# Patient Record
Sex: Female | Born: 1955 | Race: Black or African American | Hispanic: No | Smoking: Former smoker
Health system: Southern US, Community
[De-identification: ages and names within clinical notes are randomized; demographics above are authoritative.]

## PROBLEM LIST (undated history)

## (undated) DIAGNOSIS — J449 Chronic obstructive pulmonary disease, unspecified: Secondary | ICD-10-CM

## (undated) DIAGNOSIS — M170 Bilateral primary osteoarthritis of knee: Secondary | ICD-10-CM

## (undated) DIAGNOSIS — K219 Gastro-esophageal reflux disease without esophagitis: Secondary | ICD-10-CM

## (undated) DIAGNOSIS — E669 Obesity, unspecified: Secondary | ICD-10-CM

## (undated) DIAGNOSIS — M109 Gout, unspecified: Secondary | ICD-10-CM

## (undated) DIAGNOSIS — J309 Allergic rhinitis, unspecified: Secondary | ICD-10-CM

## (undated) DIAGNOSIS — G4730 Sleep apnea, unspecified: Secondary | ICD-10-CM

## (undated) DIAGNOSIS — I5030 Unspecified diastolic (congestive) heart failure: Secondary | ICD-10-CM

## (undated) DIAGNOSIS — J9610 Chronic respiratory failure, unspecified whether with hypoxia or hypercapnia: Secondary | ICD-10-CM

## (undated) DIAGNOSIS — I4891 Unspecified atrial fibrillation: Secondary | ICD-10-CM

## (undated) DIAGNOSIS — I10 Essential (primary) hypertension: Secondary | ICD-10-CM

## (undated) HISTORY — PX: TONSILLECTOMY: SUR1361

---

## 2008-05-17 ENCOUNTER — Ambulatory Visit: Payer: Self-pay | Admitting: Family Medicine

## 2008-06-12 ENCOUNTER — Inpatient Hospital Stay: Payer: Self-pay | Admitting: Internal Medicine

## 2008-06-27 ENCOUNTER — Ambulatory Visit: Payer: Self-pay | Admitting: Family Medicine

## 2008-06-29 ENCOUNTER — Ambulatory Visit: Payer: Self-pay

## 2008-07-17 ENCOUNTER — Ambulatory Visit: Payer: Self-pay | Admitting: Family Medicine

## 2008-09-08 ENCOUNTER — Encounter: Payer: Self-pay | Admitting: Specialist

## 2008-09-09 ENCOUNTER — Encounter: Payer: Self-pay | Admitting: Specialist

## 2008-10-09 ENCOUNTER — Encounter: Payer: Self-pay | Admitting: Specialist

## 2008-11-09 ENCOUNTER — Encounter: Payer: Self-pay | Admitting: Specialist

## 2008-12-09 ENCOUNTER — Encounter: Payer: Self-pay | Admitting: Specialist

## 2009-02-11 ENCOUNTER — Encounter: Payer: Self-pay | Admitting: Internal Medicine

## 2009-04-28 ENCOUNTER — Encounter: Payer: Self-pay | Admitting: Specialist

## 2009-05-11 ENCOUNTER — Encounter: Payer: Self-pay | Admitting: Specialist

## 2009-06-11 ENCOUNTER — Encounter: Payer: Self-pay | Admitting: Specialist

## 2009-07-04 ENCOUNTER — Ambulatory Visit: Payer: Self-pay | Admitting: Internal Medicine

## 2009-07-12 ENCOUNTER — Encounter: Payer: Self-pay | Admitting: Specialist

## 2009-08-09 ENCOUNTER — Encounter: Payer: Self-pay | Admitting: Specialist

## 2010-05-05 ENCOUNTER — Encounter: Payer: Self-pay | Admitting: Specialist

## 2010-05-11 ENCOUNTER — Encounter: Payer: Self-pay | Admitting: Specialist

## 2010-06-11 ENCOUNTER — Encounter: Payer: Self-pay | Admitting: Specialist

## 2010-07-03 ENCOUNTER — Other Ambulatory Visit: Payer: Self-pay | Admitting: Internal Medicine

## 2010-07-12 ENCOUNTER — Encounter: Payer: Self-pay | Admitting: Specialist

## 2010-08-10 ENCOUNTER — Encounter: Payer: Self-pay | Admitting: Specialist

## 2010-09-04 ENCOUNTER — Ambulatory Visit: Payer: Self-pay | Admitting: Internal Medicine

## 2010-09-10 ENCOUNTER — Encounter: Payer: Self-pay | Admitting: Specialist

## 2010-10-10 ENCOUNTER — Encounter: Payer: Self-pay | Admitting: Specialist

## 2010-11-10 ENCOUNTER — Encounter: Payer: Self-pay | Admitting: Specialist

## 2010-12-10 ENCOUNTER — Encounter: Payer: Self-pay | Admitting: Specialist

## 2011-02-08 ENCOUNTER — Ambulatory Visit: Payer: Self-pay | Admitting: Internal Medicine

## 2012-12-09 ENCOUNTER — Ambulatory Visit: Payer: Self-pay | Admitting: Internal Medicine

## 2014-05-07 ENCOUNTER — Inpatient Hospital Stay: Payer: Self-pay | Admitting: Internal Medicine

## 2014-05-07 LAB — COMPREHENSIVE METABOLIC PANEL
ANION GAP: 7 (ref 7–16)
AST: 46 U/L — AB (ref 15–37)
Albumin: 2.1 g/dL — ABNORMAL LOW (ref 3.4–5.0)
Alkaline Phosphatase: 117 U/L — ABNORMAL HIGH
BILIRUBIN TOTAL: 1.5 mg/dL — AB (ref 0.2–1.0)
BUN: 15 mg/dL (ref 7–18)
Calcium, Total: 8.7 mg/dL (ref 8.5–10.1)
Chloride: 100 mmol/L (ref 98–107)
Co2: 27 mmol/L (ref 21–32)
Creatinine: 1.03 mg/dL (ref 0.60–1.30)
GFR CALC NON AF AMER: 59 — AB
Glucose: 114 mg/dL — ABNORMAL HIGH (ref 65–99)
OSMOLALITY: 270 (ref 275–301)
Potassium: 3.4 mmol/L — ABNORMAL LOW (ref 3.5–5.1)
SGPT (ALT): 64 U/L — ABNORMAL HIGH
Sodium: 134 mmol/L — ABNORMAL LOW (ref 136–145)
Total Protein: 7.3 g/dL (ref 6.4–8.2)

## 2014-05-07 LAB — CBC WITH DIFFERENTIAL/PLATELET
BASOS PCT: 0.2 %
Basophil #: 0 10*3/uL (ref 0.0–0.1)
EOS ABS: 0.1 10*3/uL (ref 0.0–0.7)
Eosinophil %: 0.3 %
HCT: 33.5 % — AB (ref 35.0–47.0)
HGB: 10.4 g/dL — ABNORMAL LOW (ref 12.0–16.0)
LYMPHS ABS: 1.2 10*3/uL (ref 1.0–3.6)
LYMPHS PCT: 7.2 %
MCH: 26.7 pg (ref 26.0–34.0)
MCHC: 31 g/dL — ABNORMAL LOW (ref 32.0–36.0)
MCV: 86 fL (ref 80–100)
Monocyte #: 0.8 x10 3/mm (ref 0.2–0.9)
Monocyte %: 5 %
Neutrophil #: 14.2 10*3/uL — ABNORMAL HIGH (ref 1.4–6.5)
Neutrophil %: 87.3 %
PLATELETS: 253 10*3/uL (ref 150–440)
RBC: 3.89 10*6/uL (ref 3.80–5.20)
RDW: 14.9 % — AB (ref 11.5–14.5)
WBC: 16.3 10*3/uL — AB (ref 3.6–11.0)

## 2014-05-07 LAB — URINALYSIS, COMPLETE
Bilirubin,UR: NEGATIVE
Blood: NEGATIVE
GLUCOSE, UR: NEGATIVE mg/dL (ref 0–75)
KETONE: NEGATIVE
Leukocyte Esterase: NEGATIVE
Nitrite: NEGATIVE
Ph: 6 (ref 4.5–8.0)
RBC,UR: 2 /HPF (ref 0–5)
Specific Gravity: 1.017 (ref 1.003–1.030)
WBC UR: 31 /HPF (ref 0–5)

## 2014-05-07 LAB — PROTIME-INR
INR: 2.4
PROTHROMBIN TIME: 25.4 s — AB (ref 11.5–14.7)

## 2014-05-08 LAB — CBC WITH DIFFERENTIAL/PLATELET
Basophil #: 0 10*3/uL (ref 0.0–0.1)
Basophil %: 0.2 %
EOS PCT: 0.6 %
Eosinophil #: 0.1 10*3/uL (ref 0.0–0.7)
HCT: 32.4 % — ABNORMAL LOW (ref 35.0–47.0)
HGB: 10.2 g/dL — AB (ref 12.0–16.0)
LYMPHS ABS: 0.8 10*3/uL — AB (ref 1.0–3.6)
LYMPHS PCT: 5.4 %
MCH: 27.3 pg (ref 26.0–34.0)
MCHC: 31.4 g/dL — ABNORMAL LOW (ref 32.0–36.0)
MCV: 87 fL (ref 80–100)
MONO ABS: 0.9 x10 3/mm (ref 0.2–0.9)
Monocyte %: 6.2 %
NEUTROS ABS: 12.6 10*3/uL — AB (ref 1.4–6.5)
Neutrophil %: 87.6 %
Platelet: 225 10*3/uL (ref 150–440)
RBC: 3.74 10*6/uL — ABNORMAL LOW (ref 3.80–5.20)
RDW: 14.9 % — AB (ref 11.5–14.5)
WBC: 14.3 10*3/uL — ABNORMAL HIGH (ref 3.6–11.0)

## 2014-05-08 LAB — BASIC METABOLIC PANEL
ANION GAP: 8 (ref 7–16)
BUN: 13 mg/dL (ref 7–18)
CALCIUM: 8.6 mg/dL (ref 8.5–10.1)
CREATININE: 0.98 mg/dL (ref 0.60–1.30)
Chloride: 101 mmol/L (ref 98–107)
Co2: 27 mmol/L (ref 21–32)
EGFR (Non-African Amer.): >60
Glucose: 95 mg/dL (ref 65–99)
OSMOLALITY: 272 (ref 275–301)
Potassium: 3.2 mmol/L — ABNORMAL LOW (ref 3.5–5.1)
Sodium: 136 mmol/L (ref 136–145)

## 2014-05-08 LAB — PROTIME-INR
INR: 2.4
Prothrombin Time: 25.8 secs — ABNORMAL HIGH (ref 11.5–14.7)

## 2014-05-09 LAB — CBC WITH DIFFERENTIAL/PLATELET
BASOS PCT: 0.3 %
Basophil #: 0 10*3/uL (ref 0.0–0.1)
Eosinophil #: 0.1 10*3/uL (ref 0.0–0.7)
Eosinophil %: 0.7 %
HCT: 30.7 % — ABNORMAL LOW (ref 35.0–47.0)
HGB: 9.5 g/dL — AB (ref 12.0–16.0)
Lymphocyte #: 1.3 10*3/uL (ref 1.0–3.6)
Lymphocyte %: 9 %
MCH: 26.9 pg (ref 26.0–34.0)
MCHC: 30.9 g/dL — AB (ref 32.0–36.0)
MCV: 87 fL (ref 80–100)
MONO ABS: 1.6 x10 3/mm — AB (ref 0.2–0.9)
Monocyte %: 11 %
NEUTROS ABS: 11.7 10*3/uL — AB (ref 1.4–6.5)
Neutrophil %: 79 %
Platelet: 238 10*3/uL (ref 150–440)
RBC: 3.53 10*6/uL — ABNORMAL LOW (ref 3.80–5.20)
RDW: 14.7 % — ABNORMAL HIGH (ref 11.5–14.5)
WBC: 14.8 10*3/uL — ABNORMAL HIGH (ref 3.6–11.0)

## 2014-05-09 LAB — BASIC METABOLIC PANEL
Anion Gap: 7 (ref 7–16)
BUN: 15 mg/dL (ref 7–18)
Calcium, Total: 8.7 mg/dL (ref 8.5–10.1)
Chloride: 100 mmol/L (ref 98–107)
Co2: 27 mmol/L (ref 21–32)
Creatinine: 1.01 mg/dL (ref 0.60–1.30)
EGFR (African American): >60
EGFR (Non-African Amer.): >60
Glucose: 119 mg/dL — ABNORMAL HIGH (ref 65–99)
Osmolality: 270 (ref 275–301)
Potassium: 3.6 mmol/L (ref 3.5–5.1)
Sodium: 134 mmol/L — ABNORMAL LOW (ref 136–145)

## 2014-05-09 LAB — URINE CULTURE

## 2014-05-09 LAB — PROTIME-INR
INR: 2.4
Prothrombin Time: 25.9 secs — ABNORMAL HIGH (ref 11.5–14.7)

## 2014-05-10 LAB — CBC WITH DIFFERENTIAL/PLATELET
Basophil #: 0 10*3/uL (ref 0.0–0.1)
Basophil %: 0.2 %
EOS PCT: 0.7 %
Eosinophil #: 0.1 10*3/uL (ref 0.0–0.7)
HCT: 31.6 % — ABNORMAL LOW (ref 35.0–47.0)
HGB: 9.7 g/dL — AB (ref 12.0–16.0)
Lymphocyte #: 1.2 10*3/uL (ref 1.0–3.6)
Lymphocyte %: 8.3 %
MCH: 26.6 pg (ref 26.0–34.0)
MCHC: 30.6 g/dL — AB (ref 32.0–36.0)
MCV: 87 fL (ref 80–100)
Monocyte #: 1.4 x10 3/mm — ABNORMAL HIGH (ref 0.2–0.9)
Monocyte %: 9.3 %
NEUTROS ABS: 11.8 10*3/uL — AB (ref 1.4–6.5)
NEUTROS PCT: 81.5 %
PLATELETS: 259 10*3/uL (ref 150–440)
RBC: 3.64 10*6/uL — AB (ref 3.80–5.20)
RDW: 14.9 % — ABNORMAL HIGH (ref 11.5–14.5)
WBC: 14.5 10*3/uL — AB (ref 3.6–11.0)

## 2014-05-10 LAB — BASIC METABOLIC PANEL
Anion Gap: 9 (ref 7–16)
BUN: 17 mg/dL (ref 7–18)
CO2: 27 mmol/L (ref 21–32)
Calcium, Total: 8.9 mg/dL (ref 8.5–10.1)
Chloride: 99 mmol/L (ref 98–107)
Creatinine: 1.06 mg/dL (ref 0.60–1.30)
EGFR (African American): >60
EGFR (Non-African Amer.): 57 — ABNORMAL LOW
GLUCOSE: 124 mg/dL — AB (ref 65–99)
Osmolality: 273 (ref 275–301)
Potassium: 3.8 mmol/L (ref 3.5–5.1)
Sodium: 135 mmol/L — ABNORMAL LOW (ref 136–145)

## 2014-05-10 LAB — HEPATIC FUNCTION PANEL A (ARMC)
ALBUMIN: 1.8 g/dL — AB (ref 3.4–5.0)
ALT: 53 U/L
Alkaline Phosphatase: 109 U/L
Bilirubin, Direct: 0.5 mg/dL — ABNORMAL HIGH (ref 0.0–0.2)
Bilirubin,Total: 0.9 mg/dL (ref 0.2–1.0)
SGOT(AST): 46 U/L — ABNORMAL HIGH (ref 15–37)
Total Protein: 7.3 g/dL (ref 6.4–8.2)

## 2014-05-10 LAB — PROTIME-INR
INR: 3.2
PROTHROMBIN TIME: 32 s — AB (ref 11.5–14.7)

## 2014-05-11 LAB — CBC WITH DIFFERENTIAL/PLATELET
Basophil #: 0 10*3/uL (ref 0.0–0.1)
Basophil %: 0.3 %
Eosinophil #: 0.2 10*3/uL (ref 0.0–0.7)
Eosinophil %: 1.2 %
HCT: 29.2 % — ABNORMAL LOW (ref 35.0–47.0)
HGB: 9 g/dL — ABNORMAL LOW (ref 12.0–16.0)
LYMPHS PCT: 8.4 %
Lymphocyte #: 1.1 10*3/uL (ref 1.0–3.6)
MCH: 26.7 pg (ref 26.0–34.0)
MCHC: 30.8 g/dL — ABNORMAL LOW (ref 32.0–36.0)
MCV: 87 fL (ref 80–100)
Monocyte #: 1.1 x10 3/mm — ABNORMAL HIGH (ref 0.2–0.9)
Monocyte %: 8.6 %
NEUTROS PCT: 81.5 %
Neutrophil #: 10.8 10*3/uL — ABNORMAL HIGH (ref 1.4–6.5)
Platelet: 260 10*3/uL (ref 150–440)
RBC: 3.37 10*6/uL — AB (ref 3.80–5.20)
RDW: 15.1 % — ABNORMAL HIGH (ref 11.5–14.5)
WBC: 13.2 10*3/uL — AB (ref 3.6–11.0)

## 2014-05-11 LAB — COMPREHENSIVE METABOLIC PANEL
Albumin: 1.7 g/dL — ABNORMAL LOW (ref 3.4–5.0)
Alkaline Phosphatase: 107 U/L
Anion Gap: 8 (ref 7–16)
BUN: 19 mg/dL — AB (ref 7–18)
Bilirubin,Total: 0.9 mg/dL (ref 0.2–1.0)
CALCIUM: 8.5 mg/dL (ref 8.5–10.1)
CREATININE: 0.96 mg/dL (ref 0.60–1.30)
Chloride: 100 mmol/L (ref 98–107)
Co2: 28 mmol/L (ref 21–32)
EGFR (African American): >60
Glucose: 116 mg/dL — ABNORMAL HIGH (ref 65–99)
Osmolality: 275 (ref 275–301)
POTASSIUM: 3.9 mmol/L (ref 3.5–5.1)
SGOT(AST): 44 U/L — ABNORMAL HIGH (ref 15–37)
SGPT (ALT): 48 U/L
SODIUM: 136 mmol/L (ref 136–145)
Total Protein: 7 g/dL (ref 6.4–8.2)

## 2014-05-11 LAB — PROTIME-INR
INR: 3.1
Prothrombin Time: 31.3 secs — ABNORMAL HIGH (ref 11.5–14.7)

## 2014-05-12 LAB — CBC WITH DIFFERENTIAL/PLATELET
BASOS ABS: 0.1 10*3/uL (ref 0.0–0.1)
Basophil %: 0.5 %
EOS ABS: 0.1 10*3/uL (ref 0.0–0.7)
Eosinophil %: 1.2 %
HCT: 28.3 % — ABNORMAL LOW (ref 35.0–47.0)
HGB: 8.7 g/dL — AB (ref 12.0–16.0)
LYMPHS ABS: 1 10*3/uL (ref 1.0–3.6)
LYMPHS PCT: 9.3 %
MCH: 26.7 pg (ref 26.0–34.0)
MCHC: 30.8 g/dL — AB (ref 32.0–36.0)
MCV: 87 fL (ref 80–100)
MONO ABS: 0.9 x10 3/mm (ref 0.2–0.9)
Monocyte %: 7.9 %
NEUTROS ABS: 9 10*3/uL — AB (ref 1.4–6.5)
NEUTROS PCT: 81.1 %
PLATELETS: 267 10*3/uL (ref 150–440)
RBC: 3.26 10*6/uL — ABNORMAL LOW (ref 3.80–5.20)
RDW: 15.6 % — ABNORMAL HIGH (ref 11.5–14.5)
WBC: 11.2 10*3/uL — AB (ref 3.6–11.0)

## 2014-05-12 LAB — COMPREHENSIVE METABOLIC PANEL
ALBUMIN: 1.8 g/dL — AB (ref 3.4–5.0)
ALT: 50 U/L
AST: 48 U/L — AB (ref 15–37)
Alkaline Phosphatase: 111 U/L
Anion Gap: 7 (ref 7–16)
BUN: 19 mg/dL — AB (ref 7–18)
Bilirubin,Total: 0.7 mg/dL (ref 0.2–1.0)
CALCIUM: 9.2 mg/dL (ref 8.5–10.1)
CO2: 30 mmol/L (ref 21–32)
Chloride: 99 mmol/L (ref 98–107)
Creatinine: 0.86 mg/dL (ref 0.60–1.30)
EGFR (African American): >60
EGFR (Non-African Amer.): >60
Glucose: 109 mg/dL — ABNORMAL HIGH (ref 65–99)
Osmolality: 275 (ref 275–301)
Potassium: 4.1 mmol/L (ref 3.5–5.1)
Sodium: 136 mmol/L (ref 136–145)
TOTAL PROTEIN: 7.1 g/dL (ref 6.4–8.2)

## 2014-05-12 LAB — PROTIME-INR
INR: 4.3
Prothrombin Time: 40 secs — ABNORMAL HIGH (ref 11.5–14.7)

## 2014-05-12 LAB — CULTURE, BLOOD (SINGLE)

## 2014-07-06 ENCOUNTER — Encounter: Payer: Self-pay | Admitting: Specialist

## 2014-07-12 ENCOUNTER — Encounter: Payer: Self-pay | Admitting: Specialist

## 2014-08-10 ENCOUNTER — Encounter
Admit: 2014-08-10 | Disposition: A | Discharge status: Home / Self Care | Payer: Self-pay | Attending: Specialist | Admitting: Specialist

## 2014-09-10 ENCOUNTER — Encounter
Admit: 2014-09-10 | Disposition: A | Discharge status: Home / Self Care | Payer: Self-pay | Attending: Specialist | Admitting: Specialist

## 2014-10-02 NOTE — H&P (Signed)
PATIENT NAME:  Emily Fry, Emily Fry MR#:  [id number] DATE OF BIRTH:  August 11, 1955  DATE OF ADMISSION:  05/07/2014  PRIMARY CARE PHYSICIAN: Marya Amsler. Dareen Piano, MD  CHIEF COMPLAINT: Back pain and dysuria.   HISTORY OF PRESENT ILLNESS: The patient is a 59 year old African American female who has been having dysuria for the past 1 week associated with low back pain. Started from last Sunday. The patient is reporting that she was ambulating and quite active until last Sunday. On Sunday after she came back from church, while hanging her coat, she suddenly started having back pain. After that, because of the back pain and dysuria, she stopped ambulating and staying in the bed most of the time. Sister came from [place] and brought her into the ED. In the ED, patient's temperature is at 100.9. White count is elevated at 16.7. Tachycardic. Blood pressure is low normal. LFTs are also elevated. As the patient is having difficulty with ambulation, CAT scan and MRA of the lower back were done. The CAT scan and MRA of the back have revealed mild spinal stenosis and also mild bilateral hydronephrosis with questionable ureteral obstruction. The patient has a chronic history of atrial fibrillation and takes Coumadin. The patient is lethargic during my examination, and questions are answered by her sister who is at bedside. The patient is opening her eyes to verbal commands but falling asleep, as the patient was given anxiolytics to get MRI.   PAST MEDICAL HISTORY: Hyperlipidemia, hypertension, chronic atrial fibrillation on Coumadin, obstructive sleep apnea, morbid obesity.   PAST SURGICAL HISTORY: Tonsillectomy.   ALLERGIES: IVP DYE.   PSYCHOSOCIAL HISTORY: Lives at home, lives alone. No smoking, alcohol, or illicit drug usage.   FAMILY HISTORY: Mother died at age 47 from buccal cancer. Father deceased at age 35 of lung cancer. Sister is healthy.   HOME MEDICATIONS: Percocet 5/325 1 to 2 tablets p.o. every 4 hours as  needed for pain,  aspirin 81 mg once daily, Cartia 120 mg 1 capsule p.o. once daily,  cyclobenzaprine 10 mg p.o. 3 times a day, furosemide 40 mg p.o. once daily, metoprolol tartrate 25 mg 1/2 tablet 2 times a day, simvastatin 40 mg p.o. once daily, Voltaren topical gel 1 application topically 4 times a day.  REVIEW OF SYSTEMS: Unobtainable as the patient is lethargic.   PHYSICAL EXAMINATION:  VITAL SIGNS: Temperature 100.9, pulse 118, respirations 20 to 25, blood pressure 117/66, pulse oximetry 99% . GENERAL: Not in acute distress, morbidly obese.  HEENT: Normocephalic, atraumatic. Pupils are equally reacting to light and accommodation. No scleral icterus. No conjunctival injection. No sinus tenderness. No postnasal drip. Moist mucous membranes.  NECK: Supple. No JVD. No thyromegaly. Range of motion intact.  LUNGS: Clear to auscultation bilaterally.  CARDIAC: S1, S2 normal. Regular rate and rhythm. Tachycardic.  GASTROINTESTINAL: Soft, obese. Bowel sounds are positive in all 4 quadrants. Minor lower abdominal tenderness is present. No masses felt. Positive CVA tenderness.  NEUROLOGIC: Lethargic but arousable, but not answering questions. Reflexes are 2+. Motor and sensory could not be elicited, as the patient is lethargic.  EXTREMITIES: Edema is present, pitting edema. No cyanosis. No clubbing.  SKIN: Warm to touch. Normal turgor. No rashes. No lesions.  MUSCULOSKELETAL: No joint effusion, tenderness, erythema.  PSYCHIATRIC: Mood and affect could not be elicited, as the patient is lethargic.  LABORATORY DATA: Glucose 114, BUN 15, Sodium 134, potassium 3.4, chloride 100, CO2 of 27. GFR greater than 60. Anion gap, serum osmolality are normal. LFTs: Albumin 2.1,  bilirubin total 1.5, alkaline phosphatase 117,  ALT 64. WBC 16.3, hemoglobin 10.4, hematocrit 30.5, platelets are 253,000. Urinalysis: Glucose negative, ketones negative, blood negative. Protein 30 mg/dL. Nitrites and leukocyte esterase are  negative. Bacteria trace.  IMAGING: CAT scan of the lumbar spine without contrast: Mild central spinal stenosis is noted in L3-L4 secondary to mild broad-based posterior disk bulging and hypertrophy of ligamentum flavum posteriorly. Incidental note is made of mild bilateral hydronephrosis and proximal ureteral dilatation. Distal ureteral obstruction cannot be excluded. Moderate degenerative disk disease noted in L4-L5. Moderate central spinal canal stenosis.  MRI of the thoracic spine without contrast: Negative for  cord compression. No mass lesions. Bilateral degenerative changes. Mild spinal stenosis bilaterally at T10-T11 and mild spinal stenosis noted at T5-T6 and T11-T12.   ASSESSMENT AND PLAN: A 59 year old female, morbidly obese, coming into the Emergency Department with sudden onset of low back pain from last Sunday and 1-week history of dysuria. In the Emergency Department, the patient is febrile, tachycardic, with leukocytosis. MRI of the spine with mild spinal stenosis.  1.  Sepsis, probably from acute pyelonephritis. Pancultures were obtained. We will provide her intravenous fluids and intravenous levofloxacin.  2.  Acute low back pain, not ambulating from Sunday. Etiology is unclear. Can be from acute pyelonephritis with mild spinal stenosis. We will provide her intravenous antibiotics. Orthopedic consult is placed for further evaluation.  3.  Mild bilateral hydronephrosis with questionable distal ureteral obstruction. We will provide her intravenous fluids and Foley catheter will be placed for postrenal etiology of obstruction. Urology consult is placed.  4.  Hyperlipidemia. The patient's LFTs are elevated. Will hold off on her statin.  5.  Hypertension. Blood pressure is low normal. Will hold off on the antihypertensive medications at this time.  6.  Obstructive sleep apnea. Sister is not sure whether she uses continuous positive airway pressure at bedtime or not. Patient is lethargic at  this time. Will clarify in the morning.  7.  Morbid obesity. Lifestyle modifications are recommended. The patient might be benefited with bariatric surgery.  CODE STATUS: She is full code. Sister is the medical power-of-attorney.  Transfer care to Dr. Dareen Piano.  TOTAL TIME SPENT: 50 minutes.  Plan of care discussed with her sister in detail. She is in agreement with the plan.    ____________________________ Ramonita Lab, MD ag:ST D: 05/07/2014 21:35:58 ET T: 05/07/2014 22:23:59 ET JOB#: [id number]  cc: Ramonita Lab, MD, <Dictator> Ramonita Lab MD ELECTRONICALLY SIGNED 05/27/2014 16:56

## 2014-10-02 NOTE — Consult Note (Signed)
Chief Complaint:  Subjective/Chief Complaint Persistent low back pain; no fevers/chills. Foley catheter still in place at pts request.   VITAL SIGNS/ANCILLARY NOTES: **Vital Signs.:   29-Nov-15 07:48  Vital Signs Type Q 8hr  Temperature Temperature (F) 98.4  Celsius 36.8  Temperature Source oral  Pulse Pulse 100  Respirations Respirations 18  Systolic BP Systolic BP 782  Diastolic BP (mmHg) Diastolic BP (mmHg) 71  Mean BP 84  Pulse Ox % Pulse Ox % 92  Pulse Ox Activity Level  At rest  Oxygen Delivery Room Air/ 21 %  *Intake and Output.:   Shift 29-Nov-15 15:00  Grand Totals Intake:  240 Output:      Net:  240 24 Hr.:  240  Oral Intake      In:  240  Urine ml     Out:  0  Length of Stay Totals Intake:  2756 Output:  3100    Net:  -344   Brief Assessment:  GEN well developed, well nourished   Cardiac Regular  no murmur   Respiratory normal resp effort  clear BS   Gastrointestinal Normal   Gastrointestinal details normal Soft  Nontender   EXTR negative cyanosis/clubbing, negative edema   Additional Physical Exam no significant CVAT Catheter draining clear yellow urine   Lab Results:  Routine Chem:  29-Nov-15 05:21   Glucose, Serum  119  BUN 15  Creatinine (comp) 1.01  Sodium, Serum  134  Potassium, Serum 3.6  Chloride, Serum 100  CO2, Serum 27  Calcium (Total), Serum 8.7  Anion Gap 7  Osmolality (calc) 270  eGFR (African American) >60  eGFR (Non-African American) >60 (eGFR values <59m/min/1.73 m2 may be an indication of chronic kidney disease (CKD). Calculated eGFR, using the MRDR Study equation, is useful in  patients with stable renal function. The eGFR calculation will not be reliable in acutely ill patients when serum creatinine is changing rapidly. It is not useful in patients on dialysis. The eGFR calculation may not be applicable to patients at the low and high extremes of body sizes, pregnant women, and vegetarians.)  Routine Coag:   29-Nov-15 05:21   Prothrombin  25.9  INR 2.4 (INR reference interval applies to patients on anticoagulant therapy. A single INR therapeutic range for coumarins is not optimal for all indications; however, the suggested range for most indications is 2.0 - 3.0. Exceptions to the INR Reference Range may include: Prosthetic heart valves, acute myocardial infarction, prevention of myocardial infarction, and combinations of aspirin and anticoagulant. The need for a higher or lower target INR must be assessed individually. Reference: The Pharmacology and Management of the Vitamin K  antagonists: the seventh ACCP Conference on Antithrombotic and Thrombolytic Therapy. CUMPNT.6144Sept:126 (3suppl): [id number] A HCT value >55% may artifactually increase the PT.  In one study,  the increase was an average of 25%. Reference:  "Effect on Routine and Special Coagulation Testing Values of Citrate Anticoagulant Adjustment in Patients with High HCT Values." American Journal of Clinical Pathology 2006;126:400-405.)  Routine Hem:  29-Nov-15 05:21   WBC (CBC)  14.8  RBC (CBC)  3.53  Hemoglobin (CBC)  9.5  Hematocrit (CBC)  30.7  Platelet Count (CBC) 238  MCV 87  MCH 26.9  MCHC  30.9  RDW  14.7  Neutrophil % 79.0  Lymphocyte % 9.0  Monocyte % 11.0  Eosinophil % 0.7  Basophil % 0.3  Neutrophil #  11.7  Lymphocyte # 1.3  Monocyte #  1.6  Eosinophil # 0.1  Basophil # 0.0 (Result(s) reported on 09 May 2014 at [facility].)   Radiology Results: CT:    28-Nov-15 15:24, CT Abdomen Pelvis WO for Stone  CT Abdomen Pelvis WO for Stone   REASON FOR EXAM:    fever, hydronephrosis  COMMENTS:       PROCEDURE: CT  - CT ABDOMEN /PELVIS WO (STONE)  - May 08 2014  3:24PM     CLINICAL DATA:  Fever, low back pain, urinary tract infection, left  hydronephrosis    EXAM:  CT ABDOMEN AND PELVIS WITHOUT CONTRAST    TECHNIQUE:  Multidetector CT imaging of the abdomen and pelvis was performed  following the  standard protocol without IV contrast.  COMPARISON:  CT scan lumbar spine 05/07/2014    FINDINGS:  Lung bases shows small right pleural effusion. Bilateral basilar  posterior atelectasis.    Sagittal images of the spine shows degenerative changes  thoracolumbar spine. Unenhanced liver shows no biliary ductal  dilatation. Gallbladder is contracted. Multiple small layering  calcified gallstones are noted the largest in gallbladder neck  region measures 4.3 mm. No pericholecystic fluid. Unenhanced  pancreas, spleen and right adrenal gland is unremarkable. There is a  low-density nodule left adrenal gland measures about 1.5 cm. This  may represent a not adenoma.  No nephrolithiasis. No hydronephrosis or hydroureter. Bilateral  ureter appears normal caliber. No calcified ureteral calculi are  noted bilaterally. Bilateral distal ureter is unremarkable. There is  a Foley catheter in decompressed urinary bladder.    No aortic aneurysm.    No small bowel obstruction.    There is moderate distension of the right colon with gas and stool  measures up to 5.6 cm in diameter. Moderate gaseous distension of  transverse colon with gas measuring up to 6.2 cm in diameter. Mild  gaseous distension of descending colon up to 3.5 mm. The  rectosigmoid colon is normal caliber. Findings suggest colonic  ileus. No evidence of colonic obstruction.  The uterus is atrophic. No adnexal mass. No inguinal adenopathy.  There are streaky artifacts from patient's large body habitus.     IMPRESSION:  1. Layering calcified small gallstones are noted within gallbladder  the largest in gallbladder neck region measures 4.3 mm.  2. No nephrolithiasis. No hydronephrosis or hydroureter. No  calcified ureteral calculi.  3. There is moderate distension of the right colon with gas and  stool. Moderate distension of transverse colon with gas. Mild  gaseous distension of descending colon. Findings most likely due  to  colonic ileus. No distal colonic obstruction.  4. No small bowel obstruction.  5. Small right pleural effusion bilateral lung bases posteriorly  atelectasis.  6. Probable left adrenal adenoma measures 1.5 cm.      Electronically Signed    By: Lahoma Crocker M.D.    On: 05/08/2014 16:47         Verified By: Ephraim Hamburger, M.D.,   Assessment/Plan:  Invasive Device Daily Assessment of Necessity:  Does the patient currently have any of the following indwelling devices? foley   Indwelling Urinary Catheter no longer required, order entered to discontinue   Assessment/Plan:  Assessment 59 year old female with acute cystitis, possible pyelonephritis with back pain. Hydronephrosis not seen on follow-up CT.   Plan 1) D/C catheter and perform trial of void today 2) Continue to treat E.Coli UTI for total 7-10 days   Urology will sign off. Please don't hesitate to contact me with any further issues.   Electronic Signatures:  Prentiss Bells (MD)  (Signed [phone] 14:25)  Authored: Chief Complaint, VITAL SIGNS/ANCILLARY NOTES, Brief Assessment, Lab Results, Radiology Results, Assessment/Plan   Last Updated: 29-Nov-15 14:25 by Prentiss Bells (MD)

## 2014-10-02 NOTE — Consult Note (Signed)
Admit Diagnosis:   SEPSIS PROB SEC TO A PYELONEPHRITIS.: Onset Date: 08-May-2014, Status: Active, Description: SEPSIS PROB SEC TO A PYELONEPHRITIS.      Admit Reason:   Hydronephrosis of left kidney (N13.30): Status: Active, Coding System: ICD10, Coded Name: Unspecified hydronephrosis    Hyperlipidemia:    Arthritis:    HTN:   Home Medications: Medication Instructions Status  acetaZOLAMIDE 250 mg oral tablet 1 tab(s) orally 2 times a day  Active  aspirin 81 mg oral tablet 1  orally once a day  Active  Metoprolol Tartrate 25 mg oral tablet 0.5 tab(s) orally 2 times a day  Active  Voltaren Topical 1% topical gel 1  apply topically 4 times a day  Active  simvastatin 40 mg oral tablet 1  orally once a day  Active  Coumadin 6 mg oral tablet 1 tab(s) orally once a day Active  Coumadin 1 mg oral tablet 1.5 tab(s) orally once a day Active  acetaminophen-oxyCODONE 325 mg-5 mg oral tablet 1-2 tab(s) orally every 4 hours -for Pain Active  cyclobenzaprine 10 mg oral tablet 1 tab(s) orally 3 times a day Active  Cartia XT 120 mg/24 hours oral capsule, extended release 1 cap(s) orally once a day (at bedtime) Active  furosemide 40 mg oral tablet 1 tab(s) orally once a day Active   Lab Results:  Hepatic:  27-Nov-15 13:52   Bilirubin, Total  1.5  Alkaline Phosphatase  117 (46-116 NOTE: New Reference Range 12/29/13)  SGPT (ALT)  64 (14-63 NOTE: New Reference Range 12/29/13)  SGOT (AST)  46  Total Protein, Serum 7.3  Albumin, Serum  2.1  Routine Micro:  27-Nov-15 14:30   Micro Text Report URINE CULTURE   ORGANISM 1                >100,000 CFU/ML GRAM NEGATIVE ROD   COMMENT                   ID TO FOLLOW SENSITIVITIES TO FOLLOW   ANTIBIOTIC                       Culture Comment ID TO FOLLOW SENSITIVITIES TO FOLLOW  Result(s) reported on 08 May 2014 at 10:51AM.  Organism Name GRAM NEGATIVE ROD  Organism Quantity >100,000 CFU/ML  Specimen Source IN AND OUT CATH  Organism 1  >100,000 CFU/ML GRAM NEGATIVE ROD    23:09   Micro Text Report BLOOD CULTURE   COMMENT                   NO GROWTH IN 8-12 HOURS   ANTIBIOTIC                       Culture Comment NO GROWTH IN 8-12 HOURS  Result(s) reported on 08 May 2014 at 07:00AM.    23:17   Micro Text Report BLOOD CULTURE   COMMENT                   NO GROWTH IN 8-12 HOURS   ANTIBIOTIC                       Culture Comment NO GROWTH IN 8-12 HOURS  Result(s) reported on 08 May 2014 at 07:00AM.  Routine Chem:  27-Nov-15 13:52   Glucose, Serum  114  BUN 15  Creatinine (comp) 1.03  Sodium, Serum  134  Potassium, Serum  3.4  Chloride, Serum  100  CO2, Serum 27  Calcium (Total), Serum 8.7  Anion Gap 7  Osmolality (calc) 270  eGFR (African American) >60  eGFR (Non-African American)  59 (eGFR values <53m/min/1.73 m2 may be an indication of chronic kidney disease (CKD). Calculated eGFR, using the MRDR Study equation, is useful in  patients with stable renal function. The eGFR calculation will not be reliable in acutely ill patients when serum creatinine is changing rapidly. It is not useful in patients on dialysis. The eGFR calculation may not be applicable to patients at the low and high extremes of body sizes, pregnant women, and vegetarians.)  28-Nov-15 04:43   Glucose, Serum 95  BUN 13  Creatinine (comp) 0.98  Sodium, Serum 136  Potassium, Serum  3.2  Chloride, Serum 101  CO2, Serum 27  Calcium (Total), Serum 8.6  Anion Gap 8  Osmolality (calc) 272  eGFR (African American) >60  eGFR (Non-African American) >60 (eGFR values <675mmin/1.73 m2 may be an indication of chronic kidney disease (CKD). Calculated eGFR, using the MRDR Study equation, is useful in  patients with stable renal function. The eGFR calculation will not be reliable in acutely ill patients when serum creatinine is changing rapidly. It is not useful in patients on dialysis. The eGFR calculation may not be applicable to patients  at the low and high extremes of body sizes, pregnant women, and vegetarians.)  Routine UA:  27-Nov-15 14:30   Color (UA) Amber  Clarity (UA) Cloudy  Glucose (UA) Negative  Bilirubin (UA) Negative  Ketones (UA) Negative  Specific Gravity (UA) 1.017  Blood (UA) Negative  pH (UA) 6.0  Protein (UA) 30 mg/dL  Nitrite (UA) Negative  Leukocyte Esterase (UA) Negative (Result(s) reported on 07 May 2014 at 03:21PM.)  RBC (UA) 2 /HPF  WBC (UA) 31 /HPF  Bacteria (UA) TRACE  Epithelial Cells (UA) 7 /HPF (Result(s) reported on 07 May 2014 at 03:21PM.)  Routine Coag:  27-Nov-15 13:52   Prothrombin  25.4  INR 2.4 (INR reference interval applies to patients on anticoagulant therapy. A single INR therapeutic range for coumarins is not optimal for all indications; however, the suggested range for most indications is 2.0 - 3.0. Exceptions to the INR Reference Range may include: Prosthetic heart valves, acute myocardial infarction, prevention of myocardial infarction, and combinations of aspirin and anticoagulant. The need for a higher or lower target INR must be assessed individually. Reference: The Pharmacology and Management of the Vitamin K  antagonists: the seventh ACCP Conference on Antithrombotic and Thrombolytic Therapy. ChDGLOV.5643ept:126 (3suppl): 20N9146842A HCT value >55% may artifactually increase the PT.  In one study,  the increase was an average of 25%. Reference:  "Effect on Routine and Special Coagulation Testing Values of Citrate Anticoagulant Adjustment in Patients with High HCT Values." American Journal of Clinical Pathology 2006;126:400-405.)  28-Nov-15 04:43   Prothrombin  25.8  INR 2.4 (INR reference interval applies to patients on anticoagulant therapy. A single INR therapeutic range for coumarins is not optimal for all indications; however, the suggested range for most indications is 2.0 - 3.0. Exceptions to the INR Reference Range may include: Prosthetic  heart valves, acute myocardial infarction, prevention of myocardial infarction, and combinations of aspirin and anticoagulant. The need for a higher or lower target INR must be assessed individually. Reference: The Pharmacology and Management of the Vitamin K  antagonists: the seventh ACCP Conference on Antithrombotic and Thrombolytic Therapy. ChPIRJJ.8841ept:126 (3suppl): 20N9146842A HCT value >55% may artifactually increase the PT.  In one  study,  the increase was an average of 25%. Reference:  "Effect on Routine and Special Coagulation Testing Values of Citrate Anticoagulant Adjustment in Patients with High HCT Values." American Journal of Clinical Pathology 2006;126:400-405.)  Routine Hem:  27-Nov-15 13:52   WBC (CBC)  16.3  RBC (CBC) 3.89  Hemoglobin (CBC)  10.4  Hematocrit (CBC)  33.5  Platelet Count (CBC) 253  MCV 86  MCH 26.7  MCHC  31.0  RDW  14.9  Neutrophil % 87.3  Lymphocyte % 7.2  Monocyte % 5.0  Eosinophil % 0.3  Basophil % 0.2  Neutrophil #  14.2  Lymphocyte # 1.2  Monocyte # 0.8  Eosinophil # 0.1  Basophil # 0.0 (Result(s) reported on 07 May 2014 at 02:06PM.)  28-Nov-15 04:43   WBC (CBC)  14.3  RBC (CBC)  3.74  Hemoglobin (CBC)  10.2  Hematocrit (CBC)  32.4  Platelet Count (CBC) 225  MCV 87  MCH 27.3  MCHC  31.4  RDW  14.9  Neutrophil % 87.6  Lymphocyte % 5.4  Monocyte % 6.2  Eosinophil % 0.6  Basophil % 0.2  Neutrophil #  12.6  Lymphocyte #  0.8  Monocyte # 0.9  Eosinophil # 0.1  Basophil # 0.0 (Result(s) reported on 08 May 2014 at 06:14AM.)   Radiology Results:  Radiology Results: LabUnknown:    27-Nov-15 19:32, CT Lumbar Spine Without Contrast  PACS Image  CT:  CT Lumbar Spine Without Contrast  REASON FOR EXAM:    incontinence, fever, leg weakness b/l  COMMENTS:       PROCEDURE: CT  - CT LUMBAR SPINE WO  - May 07 2014  7:32PM     CLINICAL DATA:  Acute lower back pain.  Difficulty walking.    EXAM:  CT LUMBAR SPINE WITHOUT  CONTRAST    TECHNIQUE:  Multidetector CT imaging of the lumbar spine was performed without  intravenous contrast administration. Multiplanar CT image  reconstructions were also generated.  COMPARISON:  None.    FINDINGS:  No fracture or spondylolisthesis is noted.    T11-12: Mild degenerative disc disease is noted. No definite central  spinal canal stenosis is noted.    T12-L1:  No definite abnormality seen.    L1-2:  No significant abnormality seen.    L2-3: Mild broad-based posterior disc bulging is noted without  significant central spinal canal stenosis.  L3-4: Mild degenerative changes seen involving posterior facet  joints. Mild broad-based posterior disc bulging is noted, which in  combination with hypertrophy of ligamentum flavum posteriorly,  results in mild central spinal canal stenosis.    L4-5: Moderate degenerative disc disease is noted with anterior  osteophyte formation. Moderate degenerative change of posterior  facet joints is noted. This, in combination with mild broad-based  posterior disc bulging, results in mild to moderate central spinal  canal stenosis at this level.    L5-S1:  No definite abnormality seen.     IMPRESSION:  Mild central spinal canal stenosis is noted at L3-4 secondary to  mild broad-based posterior disc bulging and hypertrophy of  ligamentum flavum posteriorly.    Moderate degenerative disc disease is noted at L4-5. Moderate  degenerative change of the posterior facet joints is noted at this  level, which in combination with mild broad-based posterior disc  bulging, results in mild to moderate central spinal canal stenosis.    Incidental note is made of minimal to mild bilateral hydronephrosis  and proximal ureteral dilatation. Distal ureteral obstruction cannot  be  excluded.      Electronically Signed    By: Sabino Dick M.D.    On: 05/07/2014 20:20         Verified By: Marveen Reeks, M.D.,    IVP Dye:  Unknown  Nursing Flowsheets: **Vital Signs.:   28-Nov-15 08:07  Vital Signs Type Q 8hr  Temperature Temperature (F) 101.2  Celsius 38.4  Temperature Source oral  Pulse Pulse 123  Respirations Respirations 18  Systolic BP Systolic BP 032  Diastolic BP (mmHg) Diastolic BP (mmHg) 66  Mean BP 78  Pulse Ox % Pulse Ox % 90  Pulse Ox Activity Level  At rest  Oxygen Delivery 2L    09:26  Vital Signs Type Post Medication  Temperature Temperature (F) 100.6  Celsius 38.1  Temperature Source oral  Pulse Pulse 137  Pulse source if not from Vital Sign Device apical    09:57  Pulse Pulse 127  Pulse source if not from Vital Sign Device per Telemetry Clerk  Telemetry pattern Cardiac Rhythm Sinus tachycardia; pattern reported by Telemetry Clerk    12:03  Vital Signs Type Recheck  Temperature Temperature (F) 98.8  Celsius 37.1  Temperature Source oral  Pulse Pulse 109  Pulse source if not from Vital Sign Device per cardiac monitor  Telemetry pattern Cardiac Rhythm Sinus tachycardia  *Intake and Output.:   Shift 28-Nov-15 15:00  Grand Totals Intake:  1051 Output:  300    Net:  751 24 Hr.:  751  Oral Intake      In:  360  IV (Primary)      In:  691  IV (Secondary)      In:  0  Urine ml     Out:  300  Length of Stay Totals Intake:  1606 Output:  2150    Net:  -73    General Aspect 59 year old woman admitted for low back pain, fever and urinary tract infection. CT of the lumbar spine shows incidental left hydronephrosis.  Her back pain began 6 days ago suddenly when hanging her coat after church. It is mostly central but radiates to bilateral flanks. She is afebrile currently and tolerating POs. Her WBC on admission was 16.7 and urine shows possible infection. Last oral intake 1pm. No hematuria, no dysuria or significant lower urinary tract symptoms. No history of stones. No history of GU surgery or trauma.   Case History and Physical Exam:  Past Medical Health Hypertension, Chronic  Afib, hyperlipidemia, Obstructive sleep apnea, morbid obesity   Past Surgical History Tonsillectomy   Family History Lung cancer (f)   HEENT PERLA   Neck/Nodes Supple   Chest/Lungs Clear   Breasts Not examined   Cardiovascular No Murmurs or Gallops  Normal Sinus Rhythm   Abdomen Obese, benign, non-tender, no surgical scars   Genitalia Not examined   Rectal Not examined   Musculoskeletal mild bilateral CVAT, midline tenderness   Neurological Grossly WNL   Skin Warm  Dry    Impression 59 year old woman with fever and leukocytosis with likely urinary tract infection and L>R hydronephrosis on CT lumbar spine.  This may represent a kidney stone, cystitis or other lower or upper urinary tract obstruction.   Plan 1) CT Abd/Pelvis without contrast to determine etiology of left > right hydronephrosis 2) Broad spectrum antibiotics 3) NPO now in preparation for possible surgical intervention depending on CT findings  Thank you for involving me in the care of Emily Fry. Urology will continue to  follow.   Electronic Signatures for Addendum Section:  Prentiss Bells (MD) (Signed Addendum [phone] 15:49)  CT Abd/Pelvis reviewed. On my preliminary read, the hydronephrosis appears to have resolved. There are no stones in the kidneys or ureters. The bladder is well decompressed with a Foley catheter.  Her hydronephrosis may have been related to acute cystitis with bladder wall thickening, or urinary retention.  The latter may be related to her lumbar spine pathology.  Now that her UTI is resolving, would recommend that the Foley catheter be removed in the AM with a trial of void.  If she does not empty well, will pursue workup of neurogenic urinary retention   Electronic Signatures: Prentiss Bells (MD)  (Signed [phone] 15:00)  Authored: Health Issues, Significant Events - History, Home Medications, Labs, Radiology Results, Allergies, Vital Signs, General Aspect/Present Illness, History  and Physical Exam, Impression/Plan   Last Updated: 28-Nov-15 15:49 by Prentiss Bells (MD)

## 2014-10-02 NOTE — Discharge Summary (Signed)
PATIENT NAME:  Emily Fry, Emily Fry MR#:  [id number] DATE OF BIRTH:  05/22/1956  DATE OF ADMISSION:  05/07/2014 DATE OF DISCHARGE:  05/12/2014   DISCHARGE DIAGNOSES:  1.  Sepsis syndrome with urinary tract infection from Escherichia coli that was pansensitive.  2.  Severe low back pain causing weakness.  3.  Pyelonephritis associated with the above.  4.  Morbid obesity, making the above difficult.  5.  Obstructive sleep apnea.  6.  Gallstones and abnormal liver function tests with no clear cholecystitis.  7.  Weakness and inability to walk more than very short distances on her own.  8.  Atrial fibrillation, heart rate reasonably controlled at this point with an elevated INR.  9.  Elevated INR, getting a dose of oral vitamin K today; holding Coumadin for several days.   DISCHARGE MEDICATIONS:  1.  Acetazolamide 250 mg b.i.d.  2.  Metoprolol 25 mg one half b.i.d.  3.  Voltaren gel to her back q.i.d.  4.  Simvastatin 40 mg daily.  5.  Warfarin 6 mg daily, which should be restarted on 05/14/2014 with pro-times followed closely after that.  6.  Acetaminophen/oxycodone 5/325, 1 to 2 every 4 hours p.r.n. pain.  7.  Flexeril 10 mg t.i.d. p.r.n.   8.  Diltiazem XR 240 mg 1 daily.  9.  Lasix 40 mg daily.  10.  Cipro 500 mg b.i.d. for 5 days.   HISTORY AND PHYSICAL: Please see detailed history and physical done on admission.   HOSPITAL COURSE: The patient was admitted tachycardic with worsening back pain and abnormal urinalysis, felt to have pyelonephritis and possible sepsis syndrome. She improved with IV antibiotics and IV fluids, etc. She had a good response to this. She was still very weak and could not ambulate well; no more than 5 feet the first day of treatment. She slowly improved, though she was still needing a 2-person assist. It was felt she needed a skilled facility for further rehab; this is being arranged.   Her white blood cell count 16,300 on admission; it slowly decreased to 11,200 by  today's date. INR was 2.4 on admission; it increased to 3.1 yesterday. Her right warfarin was held, but the INR increased from there to 4.3. She will bet a low dose of vitamin K today, and again, this should be repeated going forward. Her ALT and AST were both slightly elevated on admission, along with a bilirubin of 1.5 and a slightly elevated alkaline phosphatase. This did improve throughout the course of the hospitalization, with a normal bilirubin today and alkaline phosphatase. ALT is down to the normal range, with a minimally elevated AST only at this point.   Her other workup included a CT of her abdomen and pelvis; it showed gallstones with no nephrolithiasis with acute findings, but likely adrenal adenoma was seen. She will be seen at the skilled facility by the medical director there and will follow up with me following her discharged.   TIME SPENT: It took approximately 35 minutes to do all discharge tasks today.    ____________________________ Marya Amsler. Dareen Piano, MD mwa:MT D: 05/12/2014 08:00:20 ET T: 05/12/2014 08:20:31 ET JOB#: [id number]  cc: Marya Amsler. Dareen Piano, MD, <Dictator> Lauro Regulus MD ELECTRONICALLY SIGNED 05/18/2014 7:51

## 2014-10-11 ENCOUNTER — Ambulatory Visit: Payer: Self-pay

## 2014-10-13 ENCOUNTER — Ambulatory Visit: Payer: Self-pay

## 2014-10-15 ENCOUNTER — Ambulatory Visit: Payer: Self-pay

## 2014-10-18 ENCOUNTER — Ambulatory Visit: Payer: Self-pay

## 2014-10-20 ENCOUNTER — Ambulatory Visit: Payer: Self-pay

## 2014-10-22 ENCOUNTER — Ambulatory Visit: Payer: Self-pay

## 2014-10-22 ENCOUNTER — Telehealth: Payer: Self-pay | Admitting: *Deleted

## 2014-10-25 ENCOUNTER — Telehealth: Payer: Self-pay

## 2014-10-25 ENCOUNTER — Ambulatory Visit: Payer: Self-pay

## 2014-10-25 NOTE — Telephone Encounter (Signed)
Left message on machine.

## 2014-10-27 ENCOUNTER — Ambulatory Visit: Payer: Self-pay

## 2014-10-29 ENCOUNTER — Ambulatory Visit: Payer: Self-pay

## 2014-11-01 ENCOUNTER — Ambulatory Visit: Payer: Self-pay

## 2014-11-01 ENCOUNTER — Encounter: Payer: Self-pay | Admitting: Respiratory Therapy

## 2014-11-01 DIAGNOSIS — I509 Heart failure, unspecified: Secondary | ICD-10-CM

## 2014-11-01 DIAGNOSIS — J449 Chronic obstructive pulmonary disease, unspecified: Secondary | ICD-10-CM

## 2014-11-01 NOTE — Progress Notes (Signed)
Pulmonary Individual Treatment Plan  Patient Details  Name: Emily Fry MRN: [id number] Date of Birth: [birth year] Referring Provider:  No ref. provider found  Initial Encounter Date:    Visit Diagnosis: No diagnosis found.  Patient's Home Medications on Admission: No current outpatient prescriptions on file.  Past Medical History: No past medical history on file.  Tobacco Use: History  Smoking status   Not on file  Smokeless tobacco   Not on file    Labs: Recent Review Flowsheet Data    There is no flowsheet data to display.       ADL UCSD:    Pulmonary Function Assessment:   Exercise Target Goals:    Exercise Program Goal: Individual exercise prescription set with THRR, safety & activity barriers. Participant demonstrates ability to understand and report RPE using BORG scale, to self-measure pulse accurately, and to acknowledge the importance of the exercise prescription.  Exercise Prescription Goal: Starting with aerobic activity 30 plus minutes a day, 3 days per week for initial exercise prescription. Provide home exercise prescription and guidelines that participant acknowledges understanding prior to discharge.  Activity Barriers & Risk Stratification:   6 Minute Walk:   Initial Exercise Prescription:   Exercise Prescription Changes:   Discharge Exercise Prescription:    Nutrition:  Target Goals: Understanding of nutrition guidelines, daily intake of sodium 1500mg , cholesterol 200mg , calories 30% from fat and 7% or less from saturated fats, daily to have 5 or more servings of fruits and vegetables.  Biometrics:    Nutrition Therapy Plan and Nutrition Goals:   Nutrition Discharge: Rate Your Plate Scores:   Psychosocial: Target Goals: Acknowledge presence or absence of depression, maximize coping skills, provide positive support system. Participant is able to verbalize types and ability to use techniques and skills needed for  reducing stress and depression.  Initial Review & Psychosocial Screening:   Quality of Life Scores:   PHQ-9: Recent Review Flowsheet Data    There is no flowsheet data to display.      Psychosocial Evaluation and Intervention:   Psychosocial Re-Evaluation:  Education: Education Goals: Education classes will be provided on a weekly basis, covering required topics. Participant will state understanding/return demonstration of topics presented.  Learning Barriers/Preferences:   Education Topics: Initial Evaluation Education: - Verbal, written and demonstration of respiratory meds, RPE/PD scales, oximetry and breathing techniques. Instruction on use of nebulizers and MDIs: cleaning and proper use, rinsing mouth with steroid doses and importance of monitoring MDI activations.   General Nutrition Guidelines/Fats and Fiber: -Group instruction provided by verbal, written material, models and posters to present the general guidelines for heart healthy nutrition. Gives an explanation and review of dietary fats and fiber.   Controlling Sodium/Reading Food Labels: -Group verbal and written material supporting the discussion of sodium use in heart healthy nutrition. Review and explanation with models, verbal and written materials for utilization of the food label.   Exercise Physiology & Risk Factors: - Group verbal and written instruction with models to review the exercise physiology of the cardiovascular system and associated critical values. Details cardiovascular disease risk factors and the goals associated with each risk factor.   Aerobic Exercise & Resistance Training: - Gives group verbal and written discussion on the health impact of inactivity. On the components of aerobic and resistive training programs and the benefits of this training and how to safely progress through these programs.   Flexibility, Balance, General Exercise Guidelines: - Provides group verbal and  written instruction on the benefits of  flexibility and balance training programs. Provides general exercise guidelines with specific guidelines to those with heart or lung disease. Demonstration and skill practice provided.   Stress Management: - Provides group verbal and written instruction about the health risks of elevated stress, cause of high stress, and healthy ways to reduce stress.   Depression: - Provides group verbal and written instruction on the correlation between heart/lung disease and depressed mood, treatment options, and the stigmas associated with seeking treatment.   Exercise & Equipment Safety: - Individual verbal instruction and demonstration of equipment use and safety with use of the equipment.   Infection Prevention: - Provides verbal and written material to individual with discussion of infection control including proper hand washing and proper equipment cleaning during exercise session.   Falls Prevention: - Provides verbal and written material to individual with discussion of falls prevention and safety.   Diabetes: - Individual verbal and written instruction to review signs/symptoms of diabetes, desired ranges of glucose level fasting, after meals and with exercise. Advice that pre and post exercise glucose checks will be done for 3 sessions at entry of program.   Chronic Lung Diseases: - Group verbal and written instruction to review new updates, new respiratory medications, new advancements in procedures and treatments. Provide informative websites and "800" numbers of self-education.   Lung Procedures: - Group verbal and written instruction to describe testing methods done to diagnose lung disease. Review the outcome of test results. Describe the treatment choices: Pulmonary Function Tests, ABGs and oximetry.   Energy Conservation: - Provide group verbal and written instruction for methods to conserve energy, plan and organize activities. Instruct on  pacing techniques, use of adaptive equipment and posture/positioning to relieve shortness of breath.   Triggers: - Group verbal and written instruction to review types of environmental controls: home humidity, furnaces, filters, dust mite/pet prevention, HEPA vacuums. To discuss weather changes, air quality and the benefits of nasal washing.   Exacerbations: - Group verbal and written instruction to provide: warning signs, infection symptoms, calling MD promptly, preventive modes, and value of vaccinations. Review: effective airway clearance, coughing and/or vibration techniques. Create an Sports administrator.   Oxygen: - Individual and group verbal and written instruction on oxygen therapy. Includes supplement oxygen, available portable oxygen systems, continuous and intermittent flow rates, oxygen safety, concentrators, and Medicare reimbursement for oxygen.   Respiratory Medications: - Group verbal and written instruction to review medications for lung disease. Drug class, frequency, complications, importance of spacers, rinsing mouth after steroid MDI's, and proper cleaning methods for nebulizers.   AED/CPR: - Group verbal and written instruction with the use of models to demonstrate the basic use of the AED with the basic ABC's of resuscitation.   Breathing Retraining: - Provides individuals verbal and written instruction on purpose, frequency, and proper technique of diaphragmatic breathing and pursed-lipped breathing. Applies individual practice skills.   Anatomy and Physiology of the Lungs: - Group verbal and written instruction with the use of models to provide basic lung anatomy and physiology related to function, structure and complications of lung disease.   Heart Failure: - Group verbal and written instruction on the basics of heart failure: signs/symptoms, treatments, explanation of ejection fraction, enlarged heart and cardiomyopathy.   Sleep Apnea: - Individual verbal and  written instruction to review Obstructive Sleep Apnea. Review of risk factors, methods for diagnosing and types of masks and machines for OSA.   Anxiety: - Provides group, verbal and written instruction on the correlation between heart/lung disease and anxiety,  treatment options, and management of anxiety.   Relaxation: - Provides group, verbal and written instruction about the benefits of relaxation for patients with heart/lung disease. Also provides patients with examples of relaxation techniques.   Knowledge Questionnaire Score:   Personal Goals and Risk Factors at Admission:   Personal Goals and Risk Factors Review:    Personal Goals Discharge:    Comments: 30 day Review; Called Ms Steele Berg 09/28/2014, she has been having problems with leg swelling; she does plan to return to [facility]; last attended 08/11/2014.

## 2014-11-03 ENCOUNTER — Ambulatory Visit: Payer: Self-pay

## 2014-11-05 ENCOUNTER — Ambulatory Visit: Payer: Self-pay

## 2014-11-09 NOTE — Telephone Encounter (Signed)
Maka called to say won't be here for visit

## 2014-11-10 ENCOUNTER — Ambulatory Visit: Payer: Self-pay

## 2014-11-11 ENCOUNTER — Other Ambulatory Visit: Payer: Self-pay | Admitting: Orthopedic Surgery

## 2014-11-11 ENCOUNTER — Ambulatory Visit
Admission: RE | Admit: 2014-11-11 | Discharge: 2014-11-11 | Disposition: A | Discharge status: Home / Self Care | Payer: Medicare Other | Source: Ambulatory Visit | Attending: Orthopedic Surgery | Admitting: Orthopedic Surgery

## 2014-11-11 DIAGNOSIS — M7989 Other specified soft tissue disorders: Secondary | ICD-10-CM | POA: Insufficient documentation

## 2014-11-11 DIAGNOSIS — M7122 Synovial cyst of popliteal space [Baker], left knee: Secondary | ICD-10-CM | POA: Diagnosis not present

## 2014-11-12 ENCOUNTER — Ambulatory Visit: Payer: Self-pay

## 2014-11-15 ENCOUNTER — Ambulatory Visit: Payer: Self-pay

## 2014-11-17 ENCOUNTER — Ambulatory Visit: Payer: Self-pay

## 2014-11-19 ENCOUNTER — Ambulatory Visit: Payer: Self-pay

## 2014-11-22 ENCOUNTER — Ambulatory Visit: Payer: Self-pay

## 2014-11-22 NOTE — Progress Notes (Signed)
Left message on machine.

## 2014-11-24 ENCOUNTER — Ambulatory Visit: Payer: Self-pay

## 2014-11-26 ENCOUNTER — Ambulatory Visit: Payer: Self-pay

## 2014-11-29 ENCOUNTER — Ambulatory Visit: Payer: Self-pay

## 2014-11-29 ENCOUNTER — Encounter: Payer: Self-pay | Admitting: Internal Medicine

## 2014-11-29 DIAGNOSIS — G4730 Sleep apnea, unspecified: Secondary | ICD-10-CM

## 2014-11-29 DIAGNOSIS — J449 Chronic obstructive pulmonary disease, unspecified: Secondary | ICD-10-CM

## 2014-11-29 NOTE — Progress Notes (Signed)
Pulmonary Individual Treatment Plan  Patient Details  Name: Emily Fry MRN: [id number] Date of Birth: [birth year] Referring Provider:  Dr Ned Clines  Initial Encounter Date: 07/06/14  Visit Diagnosis: Morbid Obesity, Sleep Apnea, Mild COPD  Patient's Home Medications on Admission: No current outpatient prescriptions on file.  Past Medical History: No past medical history on file.  Tobacco Use: History  Smoking status   Not on file  Smokeless tobacco   Not on file    Labs: Recent Review Flowsheet Data    There is no flowsheet data to display.       ADL UCSD:    Pulmonary Function Assessment:   Exercise Target Goals:    Exercise Program Goal: Individual exercise prescription set with THRR, safety & activity barriers. Participant demonstrates ability to understand and report RPE using BORG scale, to self-measure pulse accurately, and to acknowledge the importance of the exercise prescription.  Exercise Prescription Goal: Starting with aerobic activity 30 plus minutes a day, 3 days per week for initial exercise prescription. Provide home exercise prescription and guidelines that participant acknowledges understanding prior to discharge.  Activity Barriers & Risk Stratification:   6 Minute Walk:   Initial Exercise Prescription:   Exercise Prescription Changes:   Discharge Exercise Prescription (Final Exercise Prescription Changes):    Nutrition:  Target Goals: Understanding of nutrition guidelines, daily intake of sodium 1500mg , cholesterol 200mg , calories 30% from fat and 7% or less from saturated fats, daily to have 5 or more servings of fruits and vegetables.  Biometrics:    Nutrition Therapy Plan and Nutrition Goals:   Nutrition Discharge: Rate Your Plate Scores:   Psychosocial: Target Goals: Acknowledge presence or absence of depression, maximize coping skills, provide positive support system. Participant is able to verbalize types  and ability to use techniques and skills needed for reducing stress and depression.  Initial Review & Psychosocial Screening:   Quality of Life Scores:   PHQ-9: Recent Review Flowsheet Data    There is no flowsheet data to display.      Psychosocial Evaluation and Intervention:   Psychosocial Re-Evaluation:  Education: Education Goals: Education classes will be provided on a weekly basis, covering required topics. Participant will state understanding/return demonstration of topics presented.  Learning Barriers/Preferences:   Education Topics: Initial Evaluation Education: - Verbal, written and demonstration of respiratory meds, RPE/PD scales, oximetry and breathing techniques. Instruction on use of nebulizers and MDIs: cleaning and proper use, rinsing mouth with steroid doses and importance of monitoring MDI activations.   General Nutrition Guidelines/Fats and Fiber: -Group instruction provided by verbal, written material, models and posters to present the general guidelines for heart healthy nutrition. Gives an explanation and review of dietary fats and fiber.   Controlling Sodium/Reading Food Labels: -Group verbal and written material supporting the discussion of sodium use in heart healthy nutrition. Review and explanation with models, verbal and written materials for utilization of the food label.   Exercise Physiology & Risk Factors: - Group verbal and written instruction with models to review the exercise physiology of the cardiovascular system and associated critical values. Details cardiovascular disease risk factors and the goals associated with each risk factor.   Aerobic Exercise & Resistance Training: - Gives group verbal and written discussion on the health impact of inactivity. On the components of aerobic and resistive training programs and the benefits of this training and how to safely progress through these programs.   Flexibility, Balance, General  Exercise Guidelines: - Provides group verbal and written  instruction on the benefits of flexibility and balance training programs. Provides general exercise guidelines with specific guidelines to those with heart or lung disease. Demonstration and skill practice provided.   Stress Management: - Provides group verbal and written instruction about the health risks of elevated stress, cause of high stress, and healthy ways to reduce stress.   Depression: - Provides group verbal and written instruction on the correlation between heart/lung disease and depressed mood, treatment options, and the stigmas associated with seeking treatment.   Exercise & Equipment Safety: - Individual verbal instruction and demonstration of equipment use and safety with use of the equipment.   Infection Prevention: - Provides verbal and written material to individual with discussion of infection control including proper hand washing and proper equipment cleaning during exercise session.   Falls Prevention: - Provides verbal and written material to individual with discussion of falls prevention and safety.   Diabetes: - Individual verbal and written instruction to review signs/symptoms of diabetes, desired ranges of glucose level fasting, after meals and with exercise. Advice that pre and post exercise glucose checks will be done for 3 sessions at entry of program.   Chronic Lung Diseases: - Group verbal and written instruction to review new updates, new respiratory medications, new advancements in procedures and treatments. Provide informative websites and "800" numbers of self-education.   Lung Procedures: - Group verbal and written instruction to describe testing methods done to diagnose lung disease. Review the outcome of test results. Describe the treatment choices: Pulmonary Function Tests, ABGs and oximetry.   Energy Conservation: - Provide group verbal and written instruction for methods to conserve  energy, plan and organize activities. Instruct on pacing techniques, use of adaptive equipment and posture/positioning to relieve shortness of breath.   Triggers: - Group verbal and written instruction to review types of environmental controls: home humidity, furnaces, filters, dust mite/pet prevention, HEPA vacuums. To discuss weather changes, air quality and the benefits of nasal washing.   Exacerbations: - Group verbal and written instruction to provide: warning signs, infection symptoms, calling MD promptly, preventive modes, and value of vaccinations. Review: effective airway clearance, coughing and/or vibration techniques. Create an Sport and exercise psychologist.   Oxygen: - Individual and group verbal and written instruction on oxygen therapy. Includes supplement oxygen, available portable oxygen systems, continuous and intermittent flow rates, oxygen safety, concentrators, and Medicare reimbursement for oxygen.   Respiratory Medications: - Group verbal and written instruction to review medications for lung disease. Drug class, frequency, complications, importance of spacers, rinsing mouth after steroid MDI's, and proper cleaning methods for nebulizers.   AED/CPR: - Group verbal and written instruction with the use of models to demonstrate the basic use of the AED with the basic ABC's of resuscitation.   Breathing Retraining: - Provides individuals verbal and written instruction on purpose, frequency, and proper technique of diaphragmatic breathing and pursed-lipped breathing. Applies individual practice skills.   Anatomy and Physiology of the Lungs: - Group verbal and written instruction with the use of models to provide basic lung anatomy and physiology related to function, structure and complications of lung disease.   Heart Failure: - Group verbal and written instruction on the basics of heart failure: signs/symptoms, treatments, explanation of ejection fraction, enlarged heart and  cardiomyopathy.   Sleep Apnea: - Individual verbal and written instruction to review Obstructive Sleep Apnea. Review of risk factors, methods for diagnosing and types of masks and machines for OSA.   Anxiety: - Provides group, verbal and written instruction on the correlation  between heart/lung disease and anxiety, treatment options, and management of anxiety.   Relaxation: - Provides group, verbal and written instruction about the benefits of relaxation for patients with heart/lung disease. Also provides patients with examples of relaxation techniques.   Knowledge Questionnaire Score:   Personal Goals and Risk Factors at Admission:   Personal Goals and Risk Factors Review:    Personal Goals Discharge:    Comments: Ms Emily Fry last attended 08/11/14 after 7 sessions of [facility]; she has been dealing with CHF; she was called 11/22/14 and a message was left, no response.

## 2014-11-30 ENCOUNTER — Encounter: Payer: Self-pay | Admitting: Internal Medicine

## 2014-11-30 DIAGNOSIS — J449 Chronic obstructive pulmonary disease, unspecified: Secondary | ICD-10-CM

## 2014-11-30 DIAGNOSIS — G4730 Sleep apnea, unspecified: Secondary | ICD-10-CM

## 2014-11-30 NOTE — Progress Notes (Signed)
Discharge Summary  Patient Details  Name: Emily Fry MRN: [id number] Date of Birth: 08/30/1955 Referring Provider:  Dr Ned Clines   Number of Visits: 7 Reason for Discharge:  Early Discharge; Called Ms [facility] today and she requested discharge; she had leg swell and then fell which caused leg bruising. She would rather come back to [facility] at a later date when she can commit to regular attendance. She last attended 08/11/14.  Smoking History:  History  Smoking status   Not on file  Smokeless tobacco   Not on file    Diagnosis:  Morbid obesity  Sleep apnea  COPD, mild  ADL UCSD:     ADL UCSD      07/06/14 1130       ADL UCSD   ADL Phase Entry     SOB Score total 48     Rest 3     Walk 4     Stairs 5     Bath 0     Dress 0     Shop 3        Initial Exercise Prescription:   Discharge Exercise Prescription (Final Exercise Prescription Changes):     Exercise Prescription Changes - 11/30/14 1400    Exercise Review   Progression Yes   Response to Exercise   Blood Pressure (Admit) 128/72 mmHg   Blood Pressure (Exercise) 148/80 mmHg   Blood Pressure (Exit) 118/64 mmHg   Heart Rate (Admit) 83 bpm   Heart Rate (Exercise) 80 bpm   Heart Rate (Exit) 67 bpm   Oxygen Saturation (Admit) 99 %  2l/m continuous   Oxygen Saturation (Exercise) 100 %   Oxygen Saturation (Exit) 97 %   Rating of Perceived Exertion (Exercise) 12   Perceived Dyspnea (Exercise) 3   Resistance Training   Training Prescription Yes   Weight 4   Reps 10-12   NuStep   Level 2   Watts 40   Minutes 10   Arm Ergometer   Level 2   Watts 12   Minutes 10   REL-XR   Level 1   Watts 22   Minutes 10      Functional Capacity:     6 Minute Walk      07/06/14 1130       6 Minute Walk   Phase Initial     Distance 350 feet     Walk Time 4 minutes     Resting HR 89 bpm     Resting BP 142/80 mmHg     Max Ex. HR 119 bpm     Max Ex. BP 148/80 mmHg     RPE 15     Perceived  Dyspnea  4        Psychological, QOL, Others - Outcomes: PHQ 2/9: No flowsheet data found.  Quality of Life:   Personal Goals: Goals established at orientation with interventions provided to work toward goal.     Personal Goals and Risk Factors at Admission - 07/06/14 1130    Personal Goals and Risk Factors on Admission    Weight Management Yes   Intervention Learn and follow the exercise and diet guidelines while in the program. Utilize the nutrition and education classes to help gain knowledge of the diet and exercise expectations in the program   Admit Weight 304 lb 12.8 oz (138.256 kg)   Increase Aerobic Exercise and Physical Activity Yes   Intervention While in program, learn and follow the exercise prescription taught. Start  at a low level workload and increase workload after able to maintain previous level for 30 minutes. Increase time before increasing intensity.   Understand more about Heart/Pulmonary Disease. Yes   Intervention While in program utilize professionals for any questions, and attend the education sessions. Great websites to use are www.americanheart.org or www.lung.org for reliable information.   Improve shortness of breath with ADL's Yes   Intervention While in program, learn and follow the exercise prescription taught. Start at a low level workload and increase workload ad advised by the exercise physiologist. Increase time before increasing intensity.   Develop more efficient breathing techniques such as purse lipped breathing and diaphragmatic breathing; and practicing self-pacing with activity Yes   Intervention While in program, learn and utilize the specific breathing techniques taught to you. Continue to practice and use the techniques as needed.   Increase knowledge of respiratory medications and ability to use respiratory devices properly.  Yes   Intervention While in program learn and demonstrate appropriate use of your oxygen therapy by increasing flow  with exertion, manage oxygen tank operation, including continuous and intermittent flow.  Understanding oxygen is a drug ordered by your physician.   Hypertension Yes   Goal Participant will see blood pressure controlled within the values of 140/56mm/Hg or within value directed by their physician.   Intervention Provide nutrition & aerobic exercise along with prescribed medications to achieve BP 140/90 or less.   Lipids Yes   Goal Cholesterol controlled with medications as prescribed, with individualized exercise RX and with personalized nutrition plan. Value goals: LDL < 70mg , HDL > 40mg . Participant states understanding of desired cholesterol values and following prescriptions.   Intervention Provide nutrition & aerobic exercise along with prescribed medications to achieve LDL 70mg , HDL >40mg .       Personal Goals Discharge:     Personal Goals at Discharge - 11/30/14 1433    Weight Management   Comments Ms Steele Berg is working on [employer] management - no salt and eatting earlier in the day; prefered not to meet with the dietitian.   Increase Aerobic Exercise and Physical Activity   Goals Progress/Improvement seen  Yes   Comments Ms Steele Berg was working on increasing her exercise goals; she did increase her weight goal tp 3lbs.   Understand more about Heart/Pulmonary Disease   Comments Ms Steele Berg attended our education classes.   Improve shortness of breath with ADL's   Comments She was working to improve her shortness of breath through participating in [facility].   Breathing Techniques   Comments Ms Steele Berg was using PLB with her exercise.   Increase knowledge of respiratory medications   Comments Ms Steele Berg had a good understanding of her oxygen  and proper use.      Nutrition & Weight - Outcomes:    Nutrition:   Nutrition Discharge:   Education Questionnaire Score:   Thank you for the opportunity to work with your patient.

## 2014-11-30 NOTE — Progress Notes (Signed)
Pulmonary Individual Treatment Plan  Patient Details  Name: Emily Fry MRN: [id number] Date of Birth: July 19, 1955 Referring Provider:  Dr Ned Clines  Initial Encounter Date: 07/06/14  Visit Diagnosis: Morbid obesity  Sleep apnea  COPD, mild  Patient's Home Medications on Admission: No current outpatient prescriptions on file.  Past Medical History: No past medical history on file.  Tobacco Use: History  Smoking status   Not on file  Smokeless tobacco   Not on file    Labs: Recent Review Flowsheet Data    There is no flowsheet data to display.       ADL UCSD:     ADL [facility]      07/06/14 1130       ADL [facility]   ADL Phase Entry     SOB Score total 48     Rest 3     Walk 4     Stairs 5     Bath 0     Dress 0     Shop 3         Pulmonary Function Assessment:   Exercise Target Goals:    Exercise Program Goal: Individual exercise prescription set with THRR, safety & activity barriers. Participant demonstrates ability to understand and report RPE using BORG scale, to self-measure pulse accurately, and to acknowledge the importance of the exercise prescription.  Exercise Prescription Goal: Starting with aerobic activity 30 plus minutes a day, 3 days per week for initial exercise prescription. Provide home exercise prescription and guidelines that participant acknowledges understanding prior to discharge.  Activity Barriers & Risk Stratification:   6 Minute Walk:     6 Minute Walk      07/06/14 1130       6 Minute Walk   Phase Initial     Distance 350 feet     Walk Time 4 minutes     Resting HR 89 bpm     Resting BP 142/80 mmHg     Max Ex. HR 119 bpm     Max Ex. BP 148/80 mmHg     RPE 15     Perceived Dyspnea  4        Initial Exercise Prescription:   Exercise Prescription Changes:     Exercise Prescription Changes      11/30/14 1400           Exercise Review   Progression Yes       Response to Exercise   Blood Pressure  (Admit) 128/72 mmHg       Blood Pressure (Exercise) 148/80 mmHg       Blood Pressure (Exit) 118/64 mmHg       Heart Rate (Admit) 83 bpm       Heart Rate (Exercise) 80 bpm       Heart Rate (Exit) 67 bpm       Oxygen Saturation (Admit) 99 %  2l/m continuous       Oxygen Saturation (Exercise) 100 %       Oxygen Saturation (Exit) 97 %       Rating of Perceived Exertion (Exercise) 12       Perceived Dyspnea (Exercise) 3       Resistance Training   Training Prescription Yes       Weight 4       Reps 10-12       NuStep   Level 2       Watts 40       Minutes 10  Arm Ergometer   Level 2       Watts 12       Minutes 10       REL-XR   Level 1       Watts 22       Minutes 10          Discharge Exercise Prescription (Final Exercise Prescription Changes):     Exercise Prescription Changes - 11/30/14 1400    Exercise Review   Progression Yes   Response to Exercise   Blood Pressure (Admit) 128/72 mmHg   Blood Pressure (Exercise) 148/80 mmHg   Blood Pressure (Exit) 118/64 mmHg   Heart Rate (Admit) 83 bpm   Heart Rate (Exercise) 80 bpm   Heart Rate (Exit) 67 bpm   Oxygen Saturation (Admit) 99 %  2l/m continuous   Oxygen Saturation (Exercise) 100 %   Oxygen Saturation (Exit) 97 %   Rating of Perceived Exertion (Exercise) 12   Perceived Dyspnea (Exercise) 3   Resistance Training   Training Prescription Yes   Weight 4   Reps 10-12   NuStep   Level 2   Watts 40   Minutes 10   Arm Ergometer   Level 2   Watts 12   Minutes 10   REL-XR   Level 1   Watts 22   Minutes 10       Nutrition:  Target Goals: Understanding of nutrition guidelines, daily intake of sodium 1500mg , cholesterol 200mg , calories 30% from fat and 7% or less from saturated fats, daily to have 5 or more servings of fruits and vegetables.  Biometrics:    Nutrition Therapy Plan and Nutrition Goals:   Nutrition Discharge: Rate Your Plate Scores:   Psychosocial: Target Goals: Acknowledge  presence or absence of depression, maximize coping skills, provide positive support system. Participant is able to verbalize types and ability to use techniques and skills needed for reducing stress and depression.  Initial Review & Psychosocial Screening:   Quality of Life Scores:   PHQ-9:     Recent Review Flowsheet Data    There is no flowsheet data to display.      Psychosocial Evaluation and Intervention:   Psychosocial Re-Evaluation:  Education: Education Goals: Education classes will be provided on a weekly basis, covering required topics. Participant will state understanding/return demonstration of topics presented.  Learning Barriers/Preferences:   Education Topics: Initial Evaluation Education: - Verbal, written and demonstration of respiratory meds, RPE/PD scales, oximetry and breathing techniques. Instruction on use of nebulizers and MDIs: cleaning and proper use, rinsing mouth with steroid doses and importance of monitoring MDI activations.      Most Recent Value   Date  07/06/14   Educator  LB   Instruction Review Code  2- meets goals/outcomes      General Nutrition Guidelines/Fats and Fiber: -Group instruction provided by verbal, written material, models and posters to present the general guidelines for heart healthy nutrition. Gives an explanation and review of dietary fats and fiber.      Most Recent Value   Date  -- [Educated on 08/06/14 ]   Educator  CR   Instruction Review Code  2- meets goals/outcomes      Controlling Sodium/Reading Food Labels: -Group verbal and written material supporting the discussion of sodium use in heart healthy nutrition. Review and explanation with models, verbal and written materials for utilization of the food label.   Exercise Physiology & Risk Factors: - Group verbal and written instruction with models to  review the exercise physiology of the cardiovascular system and associated critical values. Details  cardiovascular disease risk factors and the goals associated with each risk factor.   Aerobic Exercise & Resistance Training: - Gives group verbal and written discussion on the health impact of inactivity. On the components of aerobic and resistive training programs and the benefits of this training and how to safely progress through these programs.   Flexibility, Balance, General Exercise Guidelines: - Provides group verbal and written instruction on the benefits of flexibility and balance training programs. Provides general exercise guidelines with specific guidelines to those with heart or lung disease. Demonstration and skill practice provided.      Most Recent Value   Date  -- [07/06/14]   Educator  SW   Instruction Review Code  2- meets goals/outcomes      Stress Management: - Provides group verbal and written instruction about the health risks of elevated stress, cause of high stress, and healthy ways to reduce stress.      Most Recent Value   Date  -- [08/04/14]   Educator  [facility]   Instruction Review Code  2- meets goals/outcomes      Depression: - Provides group verbal and written instruction on the correlation between heart/lung disease and depressed mood, treatment options, and the stigmas associated with seeking treatment.   Exercise & Equipment Safety: - Individual verbal instruction and demonstration of equipment use and safety with use of the equipment.      Most Recent Value   Date  -- [07/12/14]   Educator  LB   Instruction Review Code  2- meets goals/outcomes      Infection Prevention: - Provides verbal and written material to individual with discussion of infection control including proper hand washing and proper equipment cleaning during exercise session.      Most Recent Value   Date  -- [07/12/14]   Educator  LB   Instruction Review Code  2- meets goals/outcomes      Falls Prevention: - Provides verbal and written material to individual with discussion of  falls prevention and safety.      Most Recent Value   Date  -- [07/06/14]   Educator  LB   Instruction Review Code  2- meets goals/outcomes      Diabetes: - Individual verbal and written instruction to review signs/symptoms of diabetes, desired ranges of glucose level fasting, after meals and with exercise. Advice that pre and post exercise glucose checks will be done for 3 sessions at entry of program.   Chronic Lung Diseases: - Group verbal and written instruction to review new updates, new respiratory medications, new advancements in procedures and treatments. Provide informative websites and "800" numbers of self-education.      Most Recent Value   Date  08/02/14   Educator  LB   Instruction Review Code  2- meets goals/outcomes      Lung Procedures: - Group verbal and written instruction to describe testing methods done to diagnose lung disease. Review the outcome of test results. Describe the treatment choices: Pulmonary Function Tests, ABGs and oximetry.   Energy Conservation: - Provide group verbal and written instruction for methods to conserve energy, plan and organize activities. Instruct on pacing techniques, use of adaptive equipment and posture/positioning to relieve shortness of breath.   Triggers: - Group verbal and written instruction to review types of environmental controls: home humidity, furnaces, filters, dust mite/pet prevention, HEPA vacuums. To discuss weather changes, air quality and the benefits  of nasal washing.   Exacerbations: - Group verbal and written instruction to provide: warning signs, infection symptoms, calling MD promptly, preventive modes, and value of vaccinations. Review: effective airway clearance, coughing and/or vibration techniques. Create an Sport and exercise psychologist.      Most Recent Value   Date  -- [08/11/14]   Educator  LB   Instruction Review Code  2- meets goals/outcomes      Oxygen: - Individual and group verbal and written instruction on  oxygen therapy. Includes supplement oxygen, available portable oxygen systems, continuous and intermittent flow rates, oxygen safety, concentrators, and Medicare reimbursement for oxygen.      Most Recent Value   Date  -- [07/06/14]   Educator  LB   Instruction Review Code  2- meets goals/outcomes      Respiratory Medications: - Group verbal and written instruction to review medications for lung disease. Drug class, frequency, complications, importance of spacers, rinsing mouth after steroid MDI's, and proper cleaning methods for nebulizers.   AED/CPR: - Group verbal and written instruction with the use of models to demonstrate the basic use of the AED with the basic ABC's of resuscitation.   Breathing Retraining: - Provides individuals verbal and written instruction on purpose, frequency, and proper technique of diaphragmatic breathing and pursed-lipped breathing. Applies individual practice skills.      Most Recent Value   Date  -- [07/06/14]   Educator  LB   Instruction Review Code  2- meets goals/outcomes      Anatomy and Physiology of the Lungs: - Group verbal and written instruction with the use of models to provide basic lung anatomy and physiology related to function, structure and complications of lung disease.   Heart Failure: - Group verbal and written instruction on the basics of heart failure: signs/symptoms, treatments, explanation of ejection fraction, enlarged heart and cardiomyopathy.   Sleep Apnea: - Individual verbal and written instruction to review Obstructive Sleep Apnea. Review of risk factors, methods for diagnosing and types of masks and machines for OSA.   Anxiety: - Provides group, verbal and written instruction on the correlation between heart/lung disease and anxiety, treatment options, and management of anxiety.   Relaxation: - Provides group, verbal and written instruction about the benefits of relaxation for patients with heart/lung disease.  Also provides patients with examples of relaxation techniques.   Knowledge Questionnaire Score:   Personal Goals and Risk Factors at Admission:     Personal Goals and Risk Factors at Admission - 07/06/14 1130    Personal Goals and Risk Factors on Admission    Weight Management Yes   Intervention Learn and follow the exercise and diet guidelines while in the program. Utilize the nutrition and education classes to help gain knowledge of the diet and exercise expectations in the program   Admit Weight 304 lb 12.8 oz (138.256 kg)   Increase Aerobic Exercise and Physical Activity Yes   Intervention While in program, learn and follow the exercise prescription taught. Start at a low level workload and increase workload after able to maintain previous level for 30 minutes. Increase time before increasing intensity.   Understand more about Heart/Pulmonary Disease. Yes   Intervention While in program utilize professionals for any questions, and attend the education sessions. Great websites to use are www.americanheart.org or www.lung.org for reliable information.   Improve shortness of breath with ADL's Yes   Intervention While in program, learn and follow the exercise prescription taught. Start at a low level workload and increase workload ad  advised by the exercise physiologist. Increase time before increasing intensity.   Develop more efficient breathing techniques such as purse lipped breathing and diaphragmatic breathing; and practicing self-pacing with activity Yes   Intervention While in program, learn and utilize the specific breathing techniques taught to you. Continue to practice and use the techniques as needed.   Increase knowledge of respiratory medications and ability to use respiratory devices properly.  Yes   Intervention While in program learn and demonstrate appropriate use of your oxygen therapy by increasing flow with exertion, manage oxygen tank operation, including continuous and  intermittent flow.  Understanding oxygen is a drug ordered by your physician.   Hypertension Yes   Goal Participant will see blood pressure controlled within the values of 140/63mm/Hg or within value directed by their physician.   Intervention Provide nutrition & aerobic exercise along with prescribed medications to achieve BP 140/90 or less.   Lipids Yes   Goal Cholesterol controlled with medications as prescribed, with individualized exercise RX and with personalized nutrition plan. Value goals: LDL < , HDL > . Participant states understanding of desired cholesterol values and following prescriptions.   Intervention Provide nutrition & aerobic exercise along with prescribed medications to achieve LDL 70mg , HDL >40mg .      Personal Goals and Risk Factors Review:    Personal Goals Discharge:      Personal Goals at Discharge - 11/30/14 1433    Weight Management   Comments Ms Steele Berg is working on [employer] management - no salt and eatting earlier in the day; prefered not to meet with the dietitian.   Increase Aerobic Exercise and Physical Activity   Goals Progress/Improvement seen  Yes   Comments Ms Steele Berg was working on increasing her exercise goals; she did increase her weight goal tp 3lbs.   Understand more about Heart/Pulmonary Disease   Comments Ms Steele Berg attended our education classes.   Improve shortness of breath with ADL's   Comments She was working to improve her shortness of breath through participating in [facility].   Breathing Techniques   Comments Ms Steele Berg was using PLB with her exercise.   Increase knowledge of respiratory medications   Comments Ms Steele Berg had a good understanding of her oxygen  and proper use.      Comments: Early Discharge due to illness; plans to return at a later day.

## 2014-12-01 ENCOUNTER — Ambulatory Visit: Payer: Self-pay

## 2014-12-03 ENCOUNTER — Ambulatory Visit: Payer: Self-pay

## 2014-12-06 ENCOUNTER — Ambulatory Visit: Payer: Self-pay

## 2014-12-08 ENCOUNTER — Ambulatory Visit: Payer: Self-pay

## 2014-12-10 ENCOUNTER — Ambulatory Visit: Payer: Self-pay

## 2015-08-25 ENCOUNTER — Inpatient Hospital Stay
Admission: EM | Admit: 2015-08-25 | Discharge: 2015-08-28 | DRG: 638 | Disposition: A | Discharge status: Home / Self Care | Payer: Medicare Other | Attending: Internal Medicine | Admitting: Internal Medicine

## 2015-08-25 DIAGNOSIS — E860 Dehydration: Secondary | ICD-10-CM

## 2015-08-25 DIAGNOSIS — M109 Gout, unspecified: Secondary | ICD-10-CM | POA: Diagnosis present

## 2015-08-25 DIAGNOSIS — Z833 Family history of diabetes mellitus: Secondary | ICD-10-CM | POA: Diagnosis not present

## 2015-08-25 DIAGNOSIS — E1310 Other specified diabetes mellitus with ketoacidosis without coma: Secondary | ICD-10-CM | POA: Diagnosis present

## 2015-08-25 DIAGNOSIS — E871 Hypo-osmolality and hyponatremia: Secondary | ICD-10-CM | POA: Diagnosis present

## 2015-08-25 DIAGNOSIS — J9610 Chronic respiratory failure, unspecified whether with hypoxia or hypercapnia: Secondary | ICD-10-CM | POA: Diagnosis present

## 2015-08-25 DIAGNOSIS — E1100 Type 2 diabetes mellitus with hyperosmolarity without nonketotic hyperglycemic-hyperosmolar coma (NKHHC): Secondary | ICD-10-CM | POA: Diagnosis present

## 2015-08-25 DIAGNOSIS — Z9981 Dependence on supplemental oxygen: Secondary | ICD-10-CM | POA: Diagnosis not present

## 2015-08-25 DIAGNOSIS — Z794 Long term (current) use of insulin: Secondary | ICD-10-CM | POA: Diagnosis not present

## 2015-08-25 DIAGNOSIS — I129 Hypertensive chronic kidney disease with stage 1 through stage 4 chronic kidney disease, or unspecified chronic kidney disease: Secondary | ICD-10-CM | POA: Diagnosis present

## 2015-08-25 DIAGNOSIS — N179 Acute kidney failure, unspecified: Secondary | ICD-10-CM | POA: Diagnosis present

## 2015-08-25 DIAGNOSIS — G4733 Obstructive sleep apnea (adult) (pediatric): Secondary | ICD-10-CM | POA: Diagnosis present

## 2015-08-25 DIAGNOSIS — Z6841 Body Mass Index (BMI) 40.0 and over, adult: Secondary | ICD-10-CM | POA: Diagnosis not present

## 2015-08-25 DIAGNOSIS — E876 Hypokalemia: Secondary | ICD-10-CM

## 2015-08-25 DIAGNOSIS — I4891 Unspecified atrial fibrillation: Secondary | ICD-10-CM | POA: Diagnosis present

## 2015-08-25 DIAGNOSIS — Z9889 Other specified postprocedural states: Secondary | ICD-10-CM

## 2015-08-25 DIAGNOSIS — Z91041 Radiographic dye allergy status: Secondary | ICD-10-CM

## 2015-08-25 DIAGNOSIS — E870 Hyperosmolality and hypernatremia: Secondary | ICD-10-CM | POA: Diagnosis present

## 2015-08-25 DIAGNOSIS — R632 Polyphagia: Secondary | ICD-10-CM | POA: Diagnosis present

## 2015-08-25 DIAGNOSIS — I5032 Chronic diastolic (congestive) heart failure: Secondary | ICD-10-CM | POA: Diagnosis present

## 2015-08-25 DIAGNOSIS — Z7901 Long term (current) use of anticoagulants: Secondary | ICD-10-CM

## 2015-08-25 DIAGNOSIS — I959 Hypotension, unspecified: Secondary | ICD-10-CM | POA: Diagnosis present

## 2015-08-25 DIAGNOSIS — Z87891 Personal history of nicotine dependence: Secondary | ICD-10-CM | POA: Diagnosis not present

## 2015-08-25 DIAGNOSIS — Z23 Encounter for immunization: Secondary | ICD-10-CM | POA: Diagnosis not present

## 2015-08-25 DIAGNOSIS — N183 Chronic kidney disease, stage 3 (moderate): Secondary | ICD-10-CM | POA: Diagnosis present

## 2015-08-25 DIAGNOSIS — J449 Chronic obstructive pulmonary disease, unspecified: Secondary | ICD-10-CM | POA: Diagnosis present

## 2015-08-25 DIAGNOSIS — Z809 Family history of malignant neoplasm, unspecified: Secondary | ICD-10-CM

## 2015-08-25 DIAGNOSIS — R739 Hyperglycemia, unspecified: Secondary | ICD-10-CM

## 2015-08-25 HISTORY — DX: Unspecified atrial fibrillation: I48.91

## 2015-08-25 HISTORY — DX: Chronic respiratory failure, unspecified whether with hypoxia or hypercapnia: J96.10

## 2015-08-25 HISTORY — DX: Sleep apnea, unspecified: G47.30

## 2015-08-25 HISTORY — DX: Essential (primary) hypertension: I10

## 2015-08-25 HISTORY — DX: Gout, unspecified: M10.9

## 2015-08-25 HISTORY — DX: Obesity, unspecified: E66.9

## 2015-08-25 HISTORY — DX: Chronic obstructive pulmonary disease, unspecified: J44.9

## 2015-08-25 LAB — BASIC METABOLIC PANEL
ANION GAP: 16 — AB (ref 5–15)
ANION GAP: 11 (ref 5–15)
ANION GAP: 6 (ref 5–15)
ANION GAP: 6 (ref 5–15)
BUN: 47 mg/dL — ABNORMAL HIGH (ref 6–20)
BUN: 41 mg/dL — ABNORMAL HIGH (ref 6–20)
BUN: 35 mg/dL — AB (ref 6–20)
BUN: 35 mg/dL — AB (ref 6–20)
CALCIUM: 9.2 mg/dL (ref 8.9–10.3)
CHLORIDE: 76 mmol/L — AB (ref 101–111)
CHLORIDE: 89 mmol/L — AB (ref 101–111)
CHLORIDE: 93 mmol/L — AB (ref 101–111)
CHLORIDE: 97 mmol/L — AB (ref 101–111)
CO2: 26 mmol/L (ref 22–32)
CO2: 30 mmol/L (ref 22–32)
CO2: 30 mmol/L (ref 22–32)
CO2: 27 mmol/L (ref 22–32)
Calcium: 8.9 mg/dL (ref 8.9–10.3)
Calcium: 8.8 mg/dL — ABNORMAL LOW (ref 8.9–10.3)
Calcium: 8.7 mg/dL — ABNORMAL LOW (ref 8.9–10.3)
Creatinine, Ser: 1.64 mg/dL — ABNORMAL HIGH (ref 0.44–1.00)
Creatinine, Ser: 1.4 mg/dL — ABNORMAL HIGH (ref 0.44–1.00)
Creatinine, Ser: 1.45 mg/dL — ABNORMAL HIGH (ref 0.44–1.00)
Creatinine, Ser: 1.25 mg/dL — ABNORMAL HIGH (ref 0.44–1.00)
GFR calc Af Amer: 45 mL/min — ABNORMAL LOW (ref >60)
GFR calc Af Amer: 54 mL/min — ABNORMAL LOW (ref >60)
GFR calc non Af Amer: 34 mL/min — ABNORMAL LOW (ref >60)
GFR calc non Af Amer: 41 mL/min — ABNORMAL LOW (ref >60)
GFR, EST AFRICAN AMERICAN: 39 mL/min — AB (ref >60)
GFR, EST AFRICAN AMERICAN: 47 mL/min — AB (ref >60)
GFR, EST NON AFRICAN AMERICAN: 39 mL/min — AB (ref >60)
GFR, EST NON AFRICAN AMERICAN: 47 mL/min — AB (ref >60)
GLUCOSE: 321 mg/dL — AB (ref 65–99)
Glucose, Bld: 954 mg/dL (ref 65–99)
Glucose, Bld: 152 mg/dL — ABNORMAL HIGH (ref 65–99)
Glucose, Bld: 148 mg/dL — ABNORMAL HIGH (ref 65–99)
POTASSIUM: 3.2 mmol/L — AB (ref 3.5–5.1)
POTASSIUM: 2.6 mmol/L — AB (ref 3.5–5.1)
POTASSIUM: 3.5 mmol/L (ref 3.5–5.1)
POTASSIUM: 3.7 mmol/L (ref 3.5–5.1)
SODIUM: 118 mmol/L — AB (ref 135–145)
SODIUM: 129 mmol/L — AB (ref 135–145)
SODIUM: 130 mmol/L — AB (ref 135–145)
Sodium: 130 mmol/L — ABNORMAL LOW (ref 135–145)

## 2015-08-25 LAB — BLOOD GAS, VENOUS
ACID-BASE EXCESS: 4.5 mmol/L — AB (ref 0.0–3.0)
BICARBONATE: 32.2 meq/L — AB (ref 21.0–28.0)
O2 Saturation: 64.1 %
PCO2 VEN: 61 mmHg — AB (ref 44.0–60.0)
PO2 VEN: 36 mmHg (ref 31.0–45.0)
Patient temperature: 37
pH, Ven: 7.33 (ref 7.320–7.430)

## 2015-08-25 LAB — CBC
HCT: 37.6 % (ref 35.0–47.0)
HEMATOCRIT: 41.1 % (ref 35.0–47.0)
Hemoglobin: 13.1 g/dL (ref 12.0–16.0)
Hemoglobin: 12.6 g/dL (ref 12.0–16.0)
MCH: 27.7 pg (ref 26.0–34.0)
MCH: 27.7 pg (ref 26.0–34.0)
MCHC: 31.8 g/dL — ABNORMAL LOW (ref 32.0–36.0)
MCHC: 33.5 g/dL (ref 32.0–36.0)
MCV: 87.1 fL (ref 80.0–100.0)
MCV: 82.8 fL (ref 80.0–100.0)
PLATELETS: 173 10*3/uL (ref 150–440)
Platelets: 166 10*3/uL (ref 150–440)
RBC: 4.72 MIL/uL (ref 3.80–5.20)
RBC: 4.55 MIL/uL (ref 3.80–5.20)
RDW: 15.1 % — ABNORMAL HIGH (ref 11.5–14.5)
RDW: 14.8 % — AB (ref 11.5–14.5)
WBC: 5.9 10*3/uL (ref 3.6–11.0)
WBC: 6.1 10*3/uL (ref 3.6–11.0)

## 2015-08-25 LAB — GLUCOSE, CAPILLARY
GLUCOSE-CAPILLARY: 490 mg/dL — AB (ref 65–99)
GLUCOSE-CAPILLARY: 332 mg/dL — AB (ref 65–99)
GLUCOSE-CAPILLARY: 293 mg/dL — AB (ref 65–99)
GLUCOSE-CAPILLARY: 292 mg/dL — AB (ref 65–99)
GLUCOSE-CAPILLARY: 277 mg/dL — AB (ref 65–99)
GLUCOSE-CAPILLARY: 225 mg/dL — AB (ref 65–99)
GLUCOSE-CAPILLARY: 232 mg/dL — AB (ref 65–99)
GLUCOSE-CAPILLARY: 162 mg/dL — AB (ref 65–99)
GLUCOSE-CAPILLARY: 146 mg/dL — AB (ref 65–99)
Glucose-Capillary: >600 mg/dL (ref 65–99)
Glucose-Capillary: >600 mg/dL (ref 65–99)
Glucose-Capillary: 263 mg/dL — ABNORMAL HIGH (ref 65–99)
Glucose-Capillary: 145 mg/dL — ABNORMAL HIGH (ref 65–99)
Glucose-Capillary: 158 mg/dL — ABNORMAL HIGH (ref 65–99)
Glucose-Capillary: 206 mg/dL — ABNORMAL HIGH (ref 65–99)

## 2015-08-25 LAB — URINALYSIS COMPLETE WITH MICROSCOPIC (ARMC ONLY)
BILIRUBIN URINE: NEGATIVE
Bacteria, UA: NONE SEEN
Glucose, UA: >500 mg/dL — AB
HGB URINE DIPSTICK: NEGATIVE
Ketones, ur: NEGATIVE mg/dL
LEUKOCYTES UA: NEGATIVE
Nitrite: NEGATIVE
PH: 6 (ref 5.0–8.0)
PROTEIN: NEGATIVE mg/dL
RBC / HPF: NONE SEEN RBC/hpf (ref 0–5)
Specific Gravity, Urine: 1.011 (ref 1.005–1.030)

## 2015-08-25 LAB — MRSA PCR SCREENING: MRSA BY PCR: NEGATIVE

## 2015-08-25 LAB — HEMOGLOBIN A1C: Hgb A1c MFr Bld: 11.9 % — ABNORMAL HIGH (ref 4.0–6.0)

## 2015-08-25 MED ORDER — ENOXAPARIN SODIUM 40 MG/0.4ML ~~LOC~~ SOLN: 40.0000 mg | SUBCUTANEOUS | Status: DC

## 2015-08-25 MED ORDER — POTASSIUM CHLORIDE CRYS ER 20 MEQ PO TBCR
40.0000 meq | EXTENDED_RELEASE_TABLET | ORAL | Status: DC
  Filled 2015-08-25: qty 2

## 2015-08-25 MED ORDER — ONDANSETRON HCL 4 MG PO TABS: 4.0000 mg | ORAL_TABLET | Freq: Four times a day (QID) | ORAL | Status: DC | PRN

## 2015-08-25 MED ORDER — DEXTROSE-NACL 5-0.45 % IV SOLN: INTRAVENOUS | Status: DC

## 2015-08-25 MED ORDER — SODIUM CHLORIDE 0.9 % IV SOLN: INTRAVENOUS | Status: DC

## 2015-08-25 MED ORDER — INSULIN GLARGINE 100 UNIT/ML ~~LOC~~ SOLN: 12.0000 [IU] | Freq: Two times a day (BID) | SUBCUTANEOUS | Status: DC

## 2015-08-25 MED ORDER — INSULIN GLARGINE 100 UNIT/ML ~~LOC~~ SOLN
40.0000 [IU] | Freq: Every day | SUBCUTANEOUS | Status: DC
  Administered 2015-08-25: 40 [IU] via SUBCUTANEOUS
  Filled 2015-08-25 (×2): qty 0.4

## 2015-08-25 MED ORDER — ACETAMINOPHEN 650 MG RE SUPP: 650.0000 mg | Freq: Four times a day (QID) | RECTAL | Status: DC | PRN

## 2015-08-25 MED ORDER — LIVING WELL WITH DIABETES BOOK
Freq: Once | Status: AC
  Administered 2015-08-25: 15:00:00
  Filled 2015-08-25: qty 1

## 2015-08-25 MED ORDER — POTASSIUM CHLORIDE 20 MEQ PO PACK
40.0000 meq | PACK | ORAL | Status: AC
  Administered 2015-08-25 (×2): 40 meq via ORAL
  Filled 2015-08-25 (×2): qty 2

## 2015-08-25 MED ORDER — ENOXAPARIN SODIUM 40 MG/0.4ML ~~LOC~~ SOLN
40.0000 mg | Freq: Two times a day (BID) | SUBCUTANEOUS | Status: DC
  Administered 2015-08-25 – 2015-08-28 (×6): 40 mg via SUBCUTANEOUS
  Filled 2015-08-25 (×6): qty 0.4

## 2015-08-25 MED ORDER — METFORMIN HCL 500 MG PO TABS
500.0000 mg | ORAL_TABLET | Freq: Two times a day (BID) | ORAL | Status: DC
Start: 2015-08-25 — End: 2015-08-25

## 2015-08-25 MED ORDER — INSULIN ASPART 100 UNIT/ML ~~LOC~~ SOLN
12.0000 [IU] | Freq: Three times a day (TID) | SUBCUTANEOUS | Status: DC
  Administered 2015-08-25 – 2015-08-26 (×4): 12 [IU] via SUBCUTANEOUS
  Filled 2015-08-25 (×4): qty 12

## 2015-08-25 MED ORDER — SODIUM CHLORIDE 0.9% FLUSH
3.0000 mL | Freq: Two times a day (BID) | INTRAVENOUS | Status: DC
  Administered 2015-08-25 – 2015-08-28 (×5): 3 mL via INTRAVENOUS

## 2015-08-25 MED ORDER — INSULIN STARTER KIT- PEN NEEDLES (ENGLISH)
1.0000 | Freq: Once | Status: AC
  Administered 2015-08-25: 1
  Filled 2015-08-25 (×2): qty 1

## 2015-08-25 MED ORDER — INSULIN ASPART 100 UNIT/ML ~~LOC~~ SOLN: 0.0000 [IU] | Freq: Three times a day (TID) | SUBCUTANEOUS | Status: DC

## 2015-08-25 MED ORDER — POTASSIUM CHLORIDE 20 MEQ PO PACK
40.0000 meq | PACK | ORAL | Status: AC
  Administered 2015-08-25: 40 meq via ORAL
  Filled 2015-08-25: qty 2

## 2015-08-25 MED ORDER — POTASSIUM CHLORIDE 10 MEQ/100ML IV SOLN
10.0000 meq | INTRAVENOUS | Status: AC
  Administered 2015-08-25 (×2): 10 meq via INTRAVENOUS
  Filled 2015-08-25 (×2): qty 100

## 2015-08-25 MED ORDER — MAGNESIUM SULFATE 2 GM/50ML IV SOLN
2.0000 g | Freq: Once | INTRAVENOUS | Status: AC
  Administered 2015-08-25: 2 g via INTRAVENOUS
  Filled 2015-08-25: qty 50

## 2015-08-25 MED ORDER — STERILE WATER FOR INJECTION IJ SOLN
INTRAMUSCULAR | Status: AC
  Administered 2015-08-25: 18:00:00
  Filled 2015-08-25: qty 10

## 2015-08-25 MED ORDER — SODIUM CHLORIDE 0.9 % IV SOLN
INTRAVENOUS | Status: DC
  Administered 2015-08-25: 09:00:00 via INTRAVENOUS

## 2015-08-25 MED ORDER — SODIUM CHLORIDE 0.9 % IV SOLN
INTRAVENOUS | Status: DC
  Administered 2015-08-25 (×2): via INTRAVENOUS

## 2015-08-25 MED ORDER — INSULIN ASPART 100 UNIT/ML ~~LOC~~ SOLN: 0.0000 [IU] | Freq: Every day | SUBCUTANEOUS | Status: DC

## 2015-08-25 MED ORDER — SODIUM CHLORIDE 0.9 % IV BOLUS (SEPSIS)
1000.0000 mL | Freq: Once | INTRAVENOUS | Status: AC
  Administered 2015-08-25: 1000 mL via INTRAVENOUS

## 2015-08-25 MED ORDER — ONDANSETRON HCL 4 MG/2ML IJ SOLN: 4.0000 mg | Freq: Four times a day (QID) | INTRAMUSCULAR | Status: DC | PRN

## 2015-08-25 MED ORDER — POTASSIUM CHLORIDE 20 MEQ PO PACK
40.0000 meq | PACK | ORAL | Status: DC
  Administered 2015-08-25: 40 meq via ORAL
  Filled 2015-08-25: qty 2

## 2015-08-25 MED ORDER — INFLUENZA VAC SPLIT QUAD 0.5 ML IM SUSY
0.5000 mL | PREFILLED_SYRINGE | INTRAMUSCULAR | Status: AC
  Administered 2015-08-27: 0.5 mL via INTRAMUSCULAR
  Filled 2015-08-25: qty 0.5

## 2015-08-25 MED ORDER — ACETAMINOPHEN 325 MG PO TABS
650.0000 mg | ORAL_TABLET | Freq: Four times a day (QID) | ORAL | Status: DC | PRN
  Administered 2015-08-26: 325 mg via ORAL
  Administered 2015-08-26: 650 mg via ORAL
  Administered 2015-08-27 (×2): 325 mg via ORAL
  Administered 2015-08-27: 650 mg via ORAL
  Filled 2015-08-25 (×5): qty 2

## 2015-08-25 MED ORDER — PNEUMOCOCCAL VAC POLYVALENT 25 MCG/0.5ML IJ INJ
0.5000 mL | INJECTION | INTRAMUSCULAR | Status: AC
  Administered 2015-08-27: 0.5 mL via INTRAMUSCULAR
  Filled 2015-08-25: qty 0.5

## 2015-08-25 MED ORDER — SENNOSIDES-DOCUSATE SODIUM 8.6-50 MG PO TABS: 1.0000 | ORAL_TABLET | Freq: Every evening | ORAL | Status: DC | PRN

## 2015-08-25 MED ORDER — SODIUM CHLORIDE 0.9 % IV SOLN
INTRAVENOUS | Status: DC
  Administered 2015-08-25: 5.4 [IU]/h via INTRAVENOUS
  Filled 2015-08-25: qty 2.5

## 2015-08-25 NOTE — ED Notes (Signed)
Called lab to notify of add-on order for A1C; Lab tech verbalized acknowledgement and will notify this RN if additional blood specimen(s) are needed.

## 2015-08-25 NOTE — ED Notes (Signed)
Pt arrived to ED with c/o "nagging" abdominal pain. Pt reports called by PMD and told to report to ED as soon as possible for evaluation of high blood sugar. MD message reports Blood sugar was in 900s, low sodium, low potassium and possible DKA.

## 2015-08-25 NOTE — Progress Notes (Signed)
Per Dr. Earley Abide verbal orders pt to remain on NS and not switch fluids to D5 1/2 NS once blood sugars are less than 250 due to pt. having a diet order and eating

## 2015-08-25 NOTE — Progress Notes (Signed)
Pharmacy ICU Daily Progress Note  Emily Fry is a 59yo female admitted 08/25/15 for nonketotic hyperosmolar hyperglycemia (BG in 950s in ED), hyponatremia, hypokalemia, and AKI (Scr 1.64, doubled from last admit 05/2014 0.86). She is not a known diabetic, new onset T2DM.   She does have COPD. She reports it is severe and she uses 2 L of oxygen at home for this.   Active pharmacy consults: None  Infectious:  Antimicrobials: None WBC 5.9 Afebrile Culture Results:  Electrolytes:  Potassium: 3.2 Magnesium:  Phosphorus:  Supplementation plans:   Pulmonary: Bronchodilators? none Ventilator status: No O2 sat/FiO2: 95 on RA  Current steroids: None Taper plans:   GI:  Constipation PPx: senna-docusate 1 tab QHS prn  Feeding status:  LBM: NPO SUP: none  Insulin:  SSI use in 24hrs:  Current Insulin orders: insulin gtt @ 2.7 units/hr Last 3 CBGs: 954  DVT PPx: lovenox 40mg  SQ q12hr Body mass index is 54.05 kg/(m^2).  Sedation+Pain:  RASS goal?  GCS  Last pain score: 0 Opioid use in last 24 hrs:   Pressors:  MAP goal:   Medication education/counseling required?

## 2015-08-25 NOTE — Progress Notes (Addendum)
Inpatient Diabetes Program Recommendations  AACE/ADA: New Consensus Statement on Inpatient Glycemic Control (2015)  Target Ranges:  Prepandial:   less than 140 mg/dL      Peak postprandial:   less than 180 mg/dL (1-2 hours)      Critically ill patients:  140 - 180 mg/dL   Results for Emily Fry, Emily Fry (MRN [id number]) as of 08/25/2015 13:38  Ref. Range 08/25/2015 02:14  Sodium Latest Ref Range: 135-145 mmol/L 118 (LL)  Potassium Latest Ref Range: 3.5-5.1 mmol/L 3.2 (L)  Chloride Latest Ref Range: 101-111 mmol/L 76 (L)  CO2 Latest Ref Range: 22-32 mmol/L 26  BUN Latest Ref Range: 6-20 mg/dL 47 (H)  Creatinine Latest Ref Range: 0.44-1.00 mg/dL 1.64 (H)  Calcium Latest Ref Range: 8.9-10.3 mg/dL 8.9  EGFR (Non-African Amer.) Latest Ref Range: >60 mL/min 34 (L)  EGFR (African American) Latest Ref Range: >60 mL/min 39 (L)  Glucose Latest Ref Range: 65-99 mg/dL 954 (HH)  Anion gap Latest Ref Range: 5-15  16 (H)    Admit with: Hyperglycemia/ New diagnosis of DM  History: COPD, HTN  Current Insulin Orders: IV Insulin drip     -Note glucose 954 mg/dl, Anion Gap 16, CO2 26 on admission.  Patient given IVF and started on IV insulin drip at approximately 6am today.  -Will order DM educational materials for patient and will speak with patient when appropriate.  Will also order RD consult for DM diet education.  Nursing staff to continue basic DM education with patient at the bedside.  -Note ENDO consult made today.  Current A1c pending.   Addendum: 2pm: Note Dr. Graceann Congress from [place] office saw pt this afternoon.  Has made recommendations for transition off IV insulin drip when pt stable for transition.  Will order insulin teaching kit for pt in case decision made to discharge pt home on insulin.  Addendum 1530: Spoke with pt and her sister about new diagnosis.  Explained what an A1C is, basic pathophysiology of DM Type 2, basic home care, basic diabetes diet nutrition principles,  importance of checking CBGs and maintaining good CBG control to prevent long-term and short-term complications.  Reviewed signs and symptoms of hyperglycemia and hypoglycemia and how to treat hypoglycemia at home.  Also reviewed blood sugar goals at home.    RNs to provide ongoing basic DM education at bedside with this patient.  Have ordered educational booklet, insulin starter kit, and RD consult.   Will have RNs begin CBG meter teaching and basic insulin teaching this evening.  Will attempt to assist RNs with insulin pen teaching tomorrow.    --Will follow patient during hospitalization--  [name] RN, MSN, CDE Diabetes Coordinator Inpatient Glycemic Control Team Team Pager: [phone] (8a-5p)

## 2015-08-25 NOTE — ED Notes (Signed)
Report called to [facility] in CCU.

## 2015-08-25 NOTE — H&P (Addendum)
[facility] - [place] [facility]   PATIENT NAME: Emily Fry    MR#:  [id number]  DATE OF BIRTH:  1956-02-25  DATE OF ADMISSION:  08/25/2015  PRIMARY CARE PHYSICIAN: Lauro Regulus., MD   REQUESTING/REFERRING PHYSICIAN:   CHIEF COMPLAINT:   Chief Complaint  Patient presents with  . Hyperglycemia    HISTORY OF PRESENT ILLNESS: Emily Fry  is a 60 y.o. female with a known history of hypertension, COPD presented to the emergency room after she was told to come by primary care physician office for elevated blood sugar. Patient does not have any history of diabetes mellitus. Off late she has been having polyuria and polyphagia. No history of any chest pain and shortness of breath. No fever or chills or cough. During the workup in the emergency room blood blood sugar was found to be high around 950 mg/dL and patient's sodium level was also low. No history of any confusion or any seizures. His generalized weakness. Hospitalist service was consulted for further care.No history of recent travel or any sick contacts.   PAST MEDICAL HISTORY:   Past Medical History  Diagnosis Date  . Hypertension   . COPD (chronic obstructive pulmonary disease) (HCC)     PAST SURGICAL HISTORY: Past Surgical History  Procedure Laterality Date  . Tonsillectomy      SOCIAL HISTORY:  Social History  Substance Use Topics  . Smoking status: Former Games developer  . Smokeless tobacco: Not on file  . Alcohol Use: No    FAMILY HISTORY:  Family History  Problem Relation Age of Onset  . Cancer Mother     deceased  . Cancer Father     deceased    DRUG ALLERGIES:  Allergies  Allergen Reactions  . Contrast Media [Iodinated Diagnostic Agents] Swelling    REVIEW OF SYSTEMS:   CONSTITUTIONAL: No fever, has weakness.  EYES: No blurred or double vision.  EARS, NOSE, AND THROAT: No tinnitus or ear pain.  RESPIRATORY: No cough, shortness of breath, wheezing or hemoptysis.   CARDIOVASCULAR: No chest pain, orthopnea, edema.  GASTROINTESTINAL: No nausea, vomiting, diarrhea or abdominal pain.  GENITOURINARY: No dysuria, hematuria.  ENDOCRINE: has polyuria, nocturia, polyphagia HEMATOLOGY: No anemia, easy bruising or bleeding SKIN: No rash or lesion. MUSCULOSKELETAL: No joint pain or arthritis.   NEUROLOGIC: No tingling, numbness, weakness.  PSYCHIATRY: No anxiety or depression.   MEDICATIONS AT HOME:  Prior to Admission medications   Not on File      PHYSICAL EXAMINATION:   VITAL SIGNS: Blood pressure 116/66, pulse 98, temperature 98.4 F (36.9 C), temperature source Oral, resp. rate 20, height  (1.626 m), weight 136.079 kg (300 lb), SpO2 99 %.  GENERAL:  60 y.o.-year-old patient lying in the bed with no acute distress.  EYES: Pupils equal, round, reactive to light and accommodation. No scleral icterus. Extraocular muscles intact.  HEENT: Head atraumatic, normocephalic. Oropharynx dry and nasopharynx clear.  NECK:  Supple, no jugular venous distention. No thyroid enlargement, no tenderness.  LUNGS: Normal breath sounds bilaterally, no wheezing, rales,rhonchi or crepitation. No use of accessory muscles of respiration.  CARDIOVASCULAR: S1, S2 normal. No murmurs, rubs, or gallops.  ABDOMEN: Soft,obese, mild tenderness around umbilicus, nondistended. Bowel sounds present. No organomegaly or mass.  EXTREMITIES: No pedal edema, cyanosis, or clubbing.  NEUROLOGIC: Cranial nerves II through XII are intact. Muscle strength 5/5 in all extremities. Sensation intact. Gait normal. PSYCHIATRIC: The patient is alert and oriented x 3.  SKIN: No obvious  rash, lesion, or ulcer.   LABORATORY PANEL:   CBC  Recent Labs Lab 08/25/15 0214  WBC 5.9  HGB 13.1  HCT 41.1  PLT 166  MCV 87.1  MCH 27.7  MCHC 31.8*  RDW 15.1*   ------------------------------------------------------------------------------------------------------------------  Chemistries    Recent Labs Lab 08/25/15 0214  NA 118*  K 3.2*  CL 76*  CO2 26  GLUCOSE 954*  BUN 47*  CREATININE 1.64*  CALCIUM 8.9   ------------------------------------------------------------------------------------------------------------------ estimated creatinine clearance is 52.2 mL/min (by C-G formula based on Cr of 1.64). ------------------------------------------------------------------------------------------------------------------ No results for input(s): TSH, T4TOTAL, T3FREE, THYROIDAB in the last 72 hours.  Invalid input(s): FREET3   Coagulation profile No results for input(s): INR, PROTIME in the last 168 hours. ------------------------------------------------------------------------------------------------------------------- No results for input(s): DDIMER in the last 72 hours. -------------------------------------------------------------------------------------------------------------------  Cardiac Enzymes No results for input(s): CKMB, TROPONINI, MYOGLOBIN in the last 168 hours.  Invalid input(s): CK ------------------------------------------------------------------------------------------------------------------ Invalid input(s): POCBNP  ---------------------------------------------------------------------------------------------------------------  Urinalysis    Component Value Date/Time   COLORURINE STRAW* 08/25/2015 0214   COLORURINE Amber 05/07/2014 1430   APPEARANCEUR CLEAR* 08/25/2015 0214   APPEARANCEUR Cloudy 05/07/2014 1430   LABSPEC 1.011 08/25/2015 0214   LABSPEC 1.017 05/07/2014 1430   PHURINE 6.0 08/25/2015 0214   PHURINE 6.0 05/07/2014 1430   GLUCOSEU >500* 08/25/2015 0214   GLUCOSEU Negative 05/07/2014 1430   HGBUR NEGATIVE 08/25/2015 0214   HGBUR Negative 05/07/2014 1430   BILIRUBINUR NEGATIVE 08/25/2015 0214   BILIRUBINUR Negative 05/07/2014 1430   KETONESUR NEGATIVE 08/25/2015 0214   KETONESUR Negative 05/07/2014 1430   PROTEINUR  NEGATIVE 08/25/2015 0214   PROTEINUR 30 mg/dL 63/78/5885 0277   NITRITE NEGATIVE 08/25/2015 0214   NITRITE Negative 05/07/2014 1430   LEUKOCYTESUR NEGATIVE 08/25/2015 0214   LEUKOCYTESUR Negative 05/07/2014 1430     RADIOLOGY: No results found.  EKG: Orders placed or performed during the hospital encounter of 08/25/15  . ED EKG  . ED EKG    IMPRESSION AND PLAN: 60 year old female patient with history of COPD and hypertension presented to the emergency room with elevated blood sugar, polyuria and polyphagia. Admitting diagnosis 1. New onset diabetes mellitus 2. Dehydration 3. Nonketotic hyperosmolar hyperglycemia 4. Hyponatremia 5. Hypokalemia Treatment plan Admit patient to ICU step down unit Aggressive IV fluid hydration Replace potassium Start patient on iv insulin drip protocol Follow-up sodium level Follow-up potassium level Diabetic teaching and education.  All the records are reviewed and case discussed with ED provider. Management plans discussed with the patient, family and they are in agreement.  CODE STATUS:FULL Code Status History    This patient does not have a recorded code status. Please follow your organizational policy for patients in this situation.       TOTAL CRITICAL CARETIME TAKING CARE OF THIS PATIENT:55 minutes.    Ihor Austin M.D on 08/25/2015 at 4:06 AM  Between 7am to 6pm - Pager - [phone]  After 6pm go to [email or web address] - password [email or web address] [facility]  [place]  Office  [phone]  CC: Primary care physician; Lauro Regulus., MD

## 2015-08-25 NOTE — Consult Note (Signed)
Endocrine Initial Consult Note Date of Consult: 08/25/2015  Consulting Service: [facility]  Service Requesting Consult: Elpidio Anis, Kathie Rhodes.  SUBJECTIVE: Reason for Consultation: DKA, new-onset Diabetes  History of Present Illness: Emily Fry is a 60 y.o. female with PMH Morbid Obesity, HTN, A. Fib on coumadin, COPD on supplemental O2 admitted with nausea, vomiting, abdominal pain after her BG was found to be in the 900 range at her PCP's office. Upon admission, Serum glucose was around 950 and anion gap was 16 with venous pH 7.3. The patient has a history of Pre-diabetes but reports she was not aware this had progressed. She does have family history of type 2 DM in her paternal aunt and maternal grandfather.   Prior to admission, the patient was experiencing abdominal pain, nausea, vomiting, and inability to tolerate po since about 4 days ago. She saw her PCP and labs were checked in the office. Severe hyperglycemia and hyponatremia were noted.  She was admitted to the ICU, has received IV fluid hydration, and potassium repletion. Currently she is requesting food. She denies any abdominal pain, nausea, or vomiting.   Upon ROS, she endorses right eye blurred vision recently, left foot numbness, and lower extremity edema. Remainder of the 10 pt ROS was unremarkable.  Patient Active Problem List   Diagnosis Date Noted  . Hyperglycemia 08/25/2015  . 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     Past Medical History  Diagnosis Date  . Hypertension   . COPD (chronic obstructive pulmonary disease) (HCC)   . Chronic respiratory failure (HCC)   . Gout   . A-fib (HCC)   . Sleep apnea   . Obesity    Past Surgical History  Procedure Laterality Date  . Tonsillectomy     Family History  Problem Relation Age of Onset  . Cancer Mother     deceased  . Cancer Father     deceased    Social History:  Social History  Substance Use Topics  . Smoking status: Former Games developer  . Smokeless  tobacco: Not on file  . Alcohol Use: No     Allergies  Allergen Reactions  . Contrast Media [Iodinated Diagnostic Agents] Swelling     Medications:  No current facility-administered medications on file prior to encounter.   No current outpatient prescriptions on file prior to encounter.   OBJECTIVE: Temp:  [97.7 F (36.5 C)-98.4 F (36.9 C)] 97.7 F (36.5 C) (03/16 0845) Pulse Rate:  [83-98] 92 (03/16 1200) Resp:  [16-28] 17 (03/16 1200) BP: (90-138)/(50-82) 90/50 mmHg (03/16 1200) SpO2:  [94 %-100 %] 98 % (03/16 1200) Weight:  [136.079 kg (300 lb)-142.9 kg (315 lb 0.6 oz)] 142.9 kg (315 lb 0.6 oz) (03/16 0921)  Temp (24hrs), Avg:98.1 F (36.7 C), Min:97.7 F (36.5 C), Max:98.4 F (36.9 C)  Weight: (!) 142.9 kg (315 lb 0.6 oz)  Physical Exam: Gen: morbidly obese woman lying in bed in no acute distress  HEENT: [place]/AT, eyes anicteric, EOMI, mucous membranes moist, no oropharyngeal lesions Neck: no thyroid enlargement or nodules noted, no cervical lymphadenopathy CAD: distant heart sounds, regular rate, regular rhythm, S1, S2 PULM: clear to ausculation anteriorly, on supplemental O2 via nasal canula GI: soft, non tender, non distended, obese EXT: mild pitting edema bilaterally, no foot ulceration, +onychomycosis, 2+ DP pulses bilaterally   Skin: warm, dry, no rash Neuro: grossly non focal, normal DTRs, alert and oriented x 3  Laboratory data:  BMP Latest Ref Rng 08/25/2015 08/25/2015 05/12/2014  Glucose 65 - 99 mg/dL 578(I)  954(HH) 109(H)  BUN 6 - 20 mg/dL 20(E) 33(I) 35(W)  Creatinine 0.44 - 1.00 mg/dL 8.61(U) 8.37(G) 9.02  Sodium 135 - 145 mmol/L 130(L) 118(LL) 136  Potassium 3.5 - 5.1 mmol/L 2.6(LL) 3.2(L) 4.1  Chloride 101 - 111 mmol/L 89(L) 76(L) 99  CO2 22 - 32 mmol/L 30 26 30   Calcium 8.9 - 10.3 mg/dL 9.2 8.9 9.2   CBC Latest Ref Rng 08/25/2015 08/25/2015 05/12/2014  WBC 3.6 - 11.0 K/uL 6.1 5.9 11.2(H)  Hemoglobin 12.0 - 16.0 g/dL 11.1 55.2 0.8(Y)  Hematocrit 35.0  - 47.0 % 37.6 41.1 28.3(L)  Platelets 150 - 440 K/uL 173 166 267   Hb A1c in process.  Blood glucose values reviewed in glucose accordion view  ASSESSMENT:  DKA - resolved, AG closed DM type 2, new onset - Hb A1c in process Hyperosmolar Hyponatremia secondary to severe hyperglycemia - corrected Na was 138   Hypokalemia AKI   RECOMMENDATIONS:   Continue IV insulin per protocol Hourly glucometer checks Continue IVF hydration. Switch IVF to D5 1/2 NS once BG<250 mg/dL Aggressively replete potassium as you are Would start to transition to subQ insulin regimen by starting Lantus 40 units once daily.  Once 2 consecutive BG checks <180 mg/dL. Insulin gtt should be discontinued.  Diet can be advance as tolerated to Carb-controlled diet. Would start novolog 12 units with meals once she is eating. Diabetes education and Nutrition consult Patient will follow up with me 1-2 weeks post-discharge Thank you for this consult  Doylene Canning, MD [facility]

## 2015-08-25 NOTE — ED Notes (Signed)
Pt c/o nausea denies vomiting.  Pt denies neuro complaints.  Pt sts she has been eating normally but has incr thrist.  Pt also sts that she has been urinating more frequently.

## 2015-08-25 NOTE — ED Provider Notes (Signed)
[facility] Emergency Department Provider Note  ____________________________________________  Time seen: Approximately 3:20 AM  I have reviewed the triage vital signs and the nursing notes.   HISTORY  Chief Complaint Hyperglycemia    HPI Emily Fry is a 60 y.o. female presents for evaluation of high blood sugar and low sodium and potassium.  The patient reports her doctor called her and told her to come to the ER because he is worried she may have problems with "diabetes".  Patient reports for about the last week she's noticed she is very thirsty and she is urinating "all the time". She saw her doctor but did not have labs done until yesterday.  She denies any told breathing fevers chills or recent illness. She has had some abdominal discomfort including nausea and has vomited a couple times in the last few days, but has not thrown up today denies having pain at this time.  She is not a known diabetic. She does have COPD, reports it severe and she uses 2 L of oxygen at home for this.   Past Medical History  Diagnosis Date  . Hypertension   . COPD (chronic obstructive pulmonary disease) (HCC)     There are no active problems to display for this patient.   Past Surgical History  Procedure Laterality Date  . Tonsillectomy      No current outpatient prescriptions on file.  Allergies Contrast media  History reviewed. No pertinent family history.  Social History Social History  Substance Use Topics  . Smoking status: Former Games developer  . Smokeless tobacco: None  . Alcohol Use: No    Review of Systems Constitutional: No fever/chills Eyes: No visual changes. ENT: No sore throat. Cardiovascular: Denies chest pain. Respiratory: Denies shortness of breath. Gastrointestinal: No abdominal pain.  No nausea, no vomiting.  No diarrhea.  No constipation. Genitourinary: Negative for dysuria. Frequent urination. Also thirsty  frequently. Musculoskeletal: Negative for back pain. Skin: Negative for rash. Neurological: Negative for headaches, focal weakness or numbness.  10-point ROS otherwise negative.  ____________________________________________   PHYSICAL EXAM:  VITAL SIGNS: ED Triage Vitals  Enc Vitals Group     BP 08/25/15 0159 107/61 mmHg     Pulse Rate 08/25/15 0159 96     Resp 08/25/15 0159 20     Temp 08/25/15 0159 98.4 F (36.9 C)     Temp Source 08/25/15 0159 Oral     SpO2 08/25/15 0159 97 %     Weight 08/25/15 0159 300 lb (136.079 kg)     Height 08/25/15 0159  (1.626 m)     Head Cir --      Peak Flow --      Pain Score 08/25/15 0247 5     Pain Loc --      Pain Edu? --      Excl. in GC? --    Constitutional: Alert and oriented. Well appearing and in no acute distress. Pleasant and amicable Eyes: Conjunctivae are normal. PERRL. EOMI. Head: Atraumatic. Nose: No congestion/rhinnorhea. Mouth/Throat: Mucous membranes are dry.  Oropharynx non-erythematous. Neck: No stridor.   Cardiovascular: Normal rate, regular rhythm. Grossly normal heart sounds.  Good peripheral circulation. Respiratory: Normal respiratory effort.  No retractions. Lungs CTAB. Gastrointestinal: Soft and nontender. No distention. She is however notably obese. Musculoskeletal: No lower extremity tenderness. Neurologic:  Normal speech and language. No gross focal neurologic deficits are appreciated. Skin:  Skin is warm, dry and intact. No rash noted. Psychiatric: Mood and affect are  normal. Speech and behavior are normal.  ____________________________________________   LABS (all labs ordered are listed, but only abnormal results are displayed)  Labs Reviewed  BASIC METABOLIC PANEL - Abnormal; Notable for the following:    Sodium 118 (*)    Potassium 3.2 (*)    Chloride 76 (*)    Glucose, Bld 954 (*)    BUN 47 (*)    Creatinine, Ser 1.64 (*)    GFR calc non Af Amer 34 (*)    GFR calc Af Amer 39 (*)    Anion  gap 16 (*)    All other components within normal limits  CBC - Abnormal; Notable for the following:    MCHC 31.8 (*)    RDW 15.1 (*)    All other components within normal limits  URINALYSIS COMPLETEWITH MICROSCOPIC ([facility] ONLY) - Abnormal; Notable for the following:    Color, Urine STRAW (*)    APPearance CLEAR (*)    Glucose, UA >500 (*)    Squamous Epithelial / LPF 0-5 (*)    All other components within normal limits  BLOOD GAS, VENOUS - Abnormal; Notable for the following:    pCO2, Ven 61 (*)    Bicarbonate 32.2 (*)    Acid-Base Excess 4.5 (*)    All other components within normal limits  GLUCOSE, CAPILLARY - Abnormal; Notable for the following:    Glucose-Capillary >600 (*)    All other components within normal limits  CBG MONITORING, ED  CBG MONITORING, ED   ____________________________________________  EKG  Reviewed and interpreted me at 2:40 AM Heart rate 90 QTc 450 QRS 105 Normal sinus rhythm, artifact noted in a few leads, questionable evidence of atrial enlargement No evidence of acute ischemic abnormality, nonspecific T-wave abnormality noted likely due to artifact. ____________________________________________  RADIOLOGY   ____________________________________________   PROCEDURES  Procedure(s) performed:    Critical Care performed: No  ____________________________________________   INITIAL IMPRESSION / ASSESSMENT AND PLAN / ED COURSE  Pertinent labs & imaging results that were available during my care of the patient were reviewed by me and considered in my medical decision making (see chart for details).  Patient lives for polyuria polydipsia. She presents a request her doctor after lab draw. Her labs here indicate hyponatremia, corrects for the glucose to about 132. Potassium of 3.2. She also has acute renal insufficiency with creatinine 1.6 and GFR of 39, previously normal.  Anion gap is elevated at 18. Overall she is nontoxic and well-appearing.  I discussed with the hospitalist service fluid and electrolyte management, and advised giving oral potassium, hydrating in the ER but holding off on initiating insulin therapy until he can correct her potassium better. I think this is reasonable as she is in no acute distress, no neurologic deficits, no confusion. We will continue to monitor closely, hydrate generously. Her PCO2 is elevated, however suspect this is likely chronic based on normal pH.  Discussed and updated with the patient. Agreeable with plan for admission.   ____________________________________________   FINAL CLINICAL IMPRESSION(S) / ED DIAGNOSES  Final diagnoses:  Hyperglycemia  Dehydration  Hypokalemia      Sharyn Creamer, MD 08/25/15 [phone]

## 2015-08-25 NOTE — Progress Notes (Signed)
Pt remains alert and oriented on 1L O2 via nasal canula with O2 sats upper 90's; vss; adequate uop; up to [facility]; no c/o pain; remains on insulin drip tolerating diet blood sugars decreasing; NSR with BBB on cardiac monitor will continue to monitor and assess pt

## 2015-08-26 LAB — BASIC METABOLIC PANEL
ANION GAP: 10 (ref 5–15)
Anion gap: 8 (ref 5–15)
BUN: 38 mg/dL — AB (ref 6–20)
BUN: 28 mg/dL — ABNORMAL HIGH (ref 6–20)
CALCIUM: 8.5 mg/dL — AB (ref 8.9–10.3)
CHLORIDE: 96 mmol/L — AB (ref 101–111)
CO2: 29 mmol/L (ref 22–32)
CO2: 25 mmol/L (ref 22–32)
CREATININE: 1.22 mg/dL — AB (ref 0.44–1.00)
CREATININE: 1.03 mg/dL — AB (ref 0.44–1.00)
Calcium: 8.9 mg/dL (ref 8.9–10.3)
Chloride: 90 mmol/L — ABNORMAL LOW (ref 101–111)
GFR calc Af Amer: >60 mL/min (ref >60)
GFR calc non Af Amer: 59 mL/min — ABNORMAL LOW (ref >60)
GFR, EST AFRICAN AMERICAN: 56 mL/min — AB (ref >60)
GFR, EST NON AFRICAN AMERICAN: 48 mL/min — AB (ref >60)
GLUCOSE: 269 mg/dL — AB (ref 65–99)
GLUCOSE: 207 mg/dL — AB (ref 65–99)
POTASSIUM: 2.8 mmol/L — AB (ref 3.5–5.1)
Potassium: 2.8 mmol/L — CL (ref 3.5–5.1)
Sodium: 129 mmol/L — ABNORMAL LOW (ref 135–145)
Sodium: 129 mmol/L — ABNORMAL LOW (ref 135–145)

## 2015-08-26 LAB — GLUCOSE, CAPILLARY
GLUCOSE-CAPILLARY: 196 mg/dL — AB (ref 65–99)
GLUCOSE-CAPILLARY: 216 mg/dL — AB (ref 65–99)
GLUCOSE-CAPILLARY: 365 mg/dL — AB (ref 65–99)
Glucose-Capillary: 358 mg/dL — ABNORMAL HIGH (ref 65–99)
Glucose-Capillary: 367 mg/dL — ABNORMAL HIGH (ref 65–99)

## 2015-08-26 LAB — PROTIME-INR
INR: 1.89
Prothrombin Time: 21.6 seconds — ABNORMAL HIGH (ref 11.4–15.0)

## 2015-08-26 MED ORDER — POTASSIUM CHLORIDE CRYS ER 20 MEQ PO TBCR
20.0000 meq | EXTENDED_RELEASE_TABLET | Freq: Two times a day (BID) | ORAL | Status: DC
  Administered 2015-08-26 – 2015-08-28 (×4): 20 meq via ORAL
  Filled 2015-08-26 (×4): qty 1

## 2015-08-26 MED ORDER — INSULIN ASPART 100 UNIT/ML ~~LOC~~ SOLN
18.0000 [IU] | Freq: Three times a day (TID) | SUBCUTANEOUS | Status: DC
  Administered 2015-08-26 – 2015-08-28 (×7): 18 [IU] via SUBCUTANEOUS
  Filled 2015-08-26 (×7): qty 18

## 2015-08-26 MED ORDER — WARFARIN SODIUM 1 MG PO TABS: 6.0000 mg | ORAL_TABLET | Freq: Every day | ORAL | Status: DC

## 2015-08-26 MED ORDER — METOPROLOL TARTRATE 25 MG PO TABS: 25.0000 mg | ORAL_TABLET | Freq: Two times a day (BID) | ORAL | Status: DC

## 2015-08-26 MED ORDER — INSULIN GLARGINE 100 UNIT/ML ~~LOC~~ SOLN: 40.0000 [IU] | Freq: Every day | SUBCUTANEOUS | Status: DC

## 2015-08-26 MED ORDER — WARFARIN SODIUM 5 MG PO TABS
6.0000 mg | ORAL_TABLET | Freq: Every day | ORAL | Status: DC
  Administered 2015-08-26: 6 mg via ORAL
  Filled 2015-08-26: qty 1

## 2015-08-26 MED ORDER — PANTOPRAZOLE SODIUM 40 MG PO TBEC
40.0000 mg | DELAYED_RELEASE_TABLET | Freq: Every day | ORAL | Status: DC
  Administered 2015-08-27 – 2015-08-28 (×2): 40 mg via ORAL
  Filled 2015-08-26 (×2): qty 1

## 2015-08-26 MED ORDER — INSULIN GLARGINE 100 UNIT/ML ~~LOC~~ SOLN
40.0000 [IU] | Freq: Every day | SUBCUTANEOUS | Status: DC
  Administered 2015-08-26: 40 [IU] via SUBCUTANEOUS
  Filled 2015-08-26 (×2): qty 0.4

## 2015-08-26 MED ORDER — WARFARIN - PHARMACIST DOSING INPATIENT: Freq: Every day | Status: DC

## 2015-08-26 MED ORDER — ASPIRIN EC 81 MG PO TBEC
81.0000 mg | DELAYED_RELEASE_TABLET | Freq: Every day | ORAL | Status: DC
  Administered 2015-08-27 – 2015-08-28 (×2): 81 mg via ORAL
  Filled 2015-08-26 (×2): qty 1

## 2015-08-26 MED ORDER — WARFARIN SODIUM 1 MG PO TABS: 4.0000 mg | ORAL_TABLET | Freq: Every day | ORAL | Status: DC

## 2015-08-26 MED ORDER — METOPROLOL TARTRATE 25 MG PO TABS
12.5000 mg | ORAL_TABLET | Freq: Two times a day (BID) | ORAL | Status: DC
  Administered 2015-08-26 – 2015-08-28 (×3): 12.5 mg via ORAL
  Filled 2015-08-26 (×4): qty 1

## 2015-08-26 MED ORDER — POTASSIUM CHLORIDE 10 MEQ/100ML IV SOLN
10.0000 meq | INTRAVENOUS | Status: AC
  Administered 2015-08-26 (×4): 10 meq via INTRAVENOUS
  Filled 2015-08-26 (×4): qty 100

## 2015-08-26 MED ORDER — POTASSIUM CHLORIDE 20 MEQ PO PACK
40.0000 meq | PACK | Freq: Once | ORAL | Status: AC
  Administered 2015-08-26: 40 meq via ORAL
  Filled 2015-08-26: qty 2

## 2015-08-26 MED ORDER — DILTIAZEM HCL ER COATED BEADS 240 MG PO CP24
240.0000 mg | ORAL_CAPSULE | Freq: Every day | ORAL | Status: DC
  Administered 2015-08-26 – 2015-08-27 (×2): 240 mg via ORAL
  Filled 2015-08-26 (×2): qty 1

## 2015-08-26 MED ORDER — INSULIN ASPART 100 UNIT/ML ~~LOC~~ SOLN
0.0000 [IU] | Freq: Three times a day (TID) | SUBCUTANEOUS | Status: DC
  Administered 2015-08-26: 10 [IU] via SUBCUTANEOUS
  Administered 2015-08-27: 2 [IU] via SUBCUTANEOUS
  Administered 2015-08-27 (×2): 4 [IU] via SUBCUTANEOUS
  Administered 2015-08-28: 2 [IU] via SUBCUTANEOUS
  Filled 2015-08-26 (×2): qty 4
  Filled 2015-08-26: qty 10
  Filled 2015-08-26 (×2): qty 2

## 2015-08-26 NOTE — Progress Notes (Addendum)
Pharmacy ICU Daily Progress Note  Emily Fry is a 59yo female admitted 08/25/15 for nonketotic hyperosmolar hyperglycemia (BG in 950s in ED), hyponatremia, hypokalemia, and AKI (Scr 1.64, doubled from last admit 05/2014 0.86). She is not a known diabetic, new onset T2DM.   She does have COPD. She reports it is severe and she uses 2 L of oxygen at home for this.   H/o Afib, on warfarin 6mg  daily PTA.   Active pharmacy consults: Warfarin management  Plan: 1. INR this AM 1.89, will initiate warfarin 6mg  daily and follow up INR tomorrow.   Infectious:  Antimicrobials: None WBC 6.1 Afebrile Culture Results:  Electrolytes:  Potassium: 2.8 Magnesium: none Phosphorus: none Supplementation plans: KCl supp (4 x23mEq IV runs, PO)  Pulmonary: Bronchodilators? none  Ventilator status: No O2 sat/FiO2: 95 on RA  Current steroids: None Taper plans:   GI:  Constipation PPx: senna-docusate 1 tab QHS prn  Feeding status: NPO LBM: 3/16 SUP: none  Insulin:  SSI use in 24hrs:  Current Insulin orders: insulin gtt stopped at 1900 last night Last 3 CBGs: 954  DVT PPx: lovenox 40mg  SQ q12hr Body mass index is 54.05 kg/(m^2). Started back on home warfarin  Sedation+Pain:  RASS goal?  GCS  Last pain score: 0 Opioid use in last 24 hrs:   Pressors:  MAP goal:   Medication education/counseling required?

## 2015-08-26 NOTE — Progress Notes (Signed)
[facility] - [place] at [facility]   PATIENT NAME: Emily Fry    MR#:  [id number]  DATE OF BIRTH:  08/11/1955  SUBJECTIVE:  CHIEF COMPLAINT:   Chief Complaint  Patient presents with  . Hyperglycemia   Admitted for DKA. New onset DM. Off insulin drip BP low  REVIEW OF SYSTEMS:    Review of Systems  Constitutional: Positive for malaise/fatigue. Negative for fever and chills.  HENT: Negative for sore throat.   Eyes: Negative for blurred vision, double vision and pain.  Respiratory: Negative for cough, hemoptysis, shortness of breath and wheezing.   Cardiovascular: Negative for chest pain, palpitations, orthopnea and leg swelling.  Gastrointestinal: Negative for heartburn, nausea, vomiting, abdominal pain, diarrhea and constipation.  Genitourinary: Negative for dysuria and hematuria.  Musculoskeletal: Negative for back pain and joint pain.  Skin: Negative for rash.  Neurological: Negative for sensory change, speech change, focal weakness and headaches.  Endo/Heme/Allergies: Does not bruise/bleed easily.  Psychiatric/Behavioral: Negative for depression. The patient is not nervous/anxious.     DRUG ALLERGIES:   Allergies  Allergen Reactions  . Contrast Media [Iodinated Diagnostic Agents] Swelling    VITALS:  Blood pressure 89/57, pulse 102, temperature 98.2 F (36.8 C), temperature source Oral, resp. rate 19, height  (1.626 m), weight 142.9 kg (315 lb 0.6 oz), SpO2 97 %.  PHYSICAL EXAMINATION:   Physical Exam  GENERAL:  60 y.o.-year-old patient lying in the bed with no acute distress. Obese EYES: Pupils equal, round, reactive to light and accommodation. No scleral icterus. Extraocular muscles intact.  HEENT: Head atraumatic, normocephalic. Oropharynx and nasopharynx clear.  NECK:  Supple, no jugular venous distention. No thyroid enlargement, no tenderness.  LUNGS: Normal breath sounds bilaterally, no wheezing, rales, rhonchi. No use of  accessory muscles of respiration.  CARDIOVASCULAR: S1, S2 normal. No murmurs, rubs, or gallops.  ABDOMEN: Soft, nontender, nondistended. Bowel sounds present. No organomegaly or mass.  EXTREMITIES: No cyanosis, clubbing or edema b/l.    NEUROLOGIC: Cranial nerves II through XII are intact. No focal Motor or sensory deficits b/l.   PSYCHIATRIC: The patient is alert and oriented x 3.  SKIN: No obvious rash, lesion, or ulcer.   LABORATORY PANEL:   CBC  Recent Labs Lab 08/25/15 0924  WBC 6.1  HGB 12.6  HCT 37.6  PLT 173   ------------------------------------------------------------------------------------------------------------------ Chemistries   Recent Labs Lab 08/26/15 0330  NA 129*  K 2.8*  CL 96*  CO2 25  GLUCOSE 207*  BUN 28*  CREATININE 1.03*  CALCIUM 8.5*   ------------------------------------------------------------------------------------------------------------------  Cardiac Enzymes No results for input(s): TROPONINI in the last 168 hours. ------------------------------------------------------------------------------------------------------------------  RADIOLOGY:  No results found.   ASSESSMENT AND PLAN:   * New onset DM with DKA DKA has resolved Started on Lantus 40 units daily. Novolog increased from 12 units TIDWM to 18 units today by Dr. Tedd Sias. BS >350  * Hypotension Likely from dehydartion Restarted Metoprolol with h/o Afib. Pt is in sinus tach. Restart Cartia when BP improved. Baseline BP is SBP 120s reviewing PCP notes.  * Chronic diastolic chf No sign of fluid overload  * ARF has resolved  * COPD with chronic resp failure Stable  * Hypokalemia Replace PO  Likely d/c in AM if BP and blood sugars improved   All the records are reviewed and case discussed with Care Management/Social Workerr. Management plans discussed with the patient, family and they are in agreement.  CODE STATUS: FULL  DVT Prophylaxis: SCDs  TOTAL TIME  TAKING CARE OF THIS PATIENT: 35 minutes.   POSSIBLE D/C IN 1-2 DAYS, DEPENDING ON CLINICAL CONDITION.  Milagros Loll R M.D on 08/26/2015 at 1:24 PM  Between 7am to 6pm - Pager - [phone]  After 6pm go to [email or web address] - password [email or web address]  [facility]  Office  [phone]  CC: Primary care physician; Lauro Regulus., MD  Note: This dictation was prepared with Dragon dictation along with smaller phrase technology. Any transcriptional errors that result from this process are unintentional.

## 2015-08-26 NOTE — Progress Notes (Signed)
Endocrinology follow up   Emily Fry a 60 y.o. female seen in follow up for new diagnosis of type 2 diabetes. Now transferred to medicine floor and on subQ multiple daily doses of insulin, with lantus 40 units daily and NovoLog 12 units tid AC. She has some nausea. Tolerating carb control diet. She has been able to get up and out of bed. No lightheadedness. No chest pain. No sob. Sister at bedside. They confirm that diabetes eductors have instructed her on self-administration of insulin. She feels confident she give herself insulin after hospital discharge.   Medical history Past Medical History  Diagnosis Date  . Hypertension   . COPD (chronic obstructive pulmonary disease) (HCC)   . Chronic respiratory failure (HCC)   . Gout   . A-fib (HCC)   . Sleep apnea   . Obesity      Surgical history Past Surgical History  Procedure Laterality Date  . Tonsillectomy       Medications . aspirin EC  81 mg Oral Daily  . diltiazem  240 mg Oral QHS  . enoxaparin (LOVENOX) injection  40 mg Subcutaneous Q12H  . Influenza vac split quadrivalent PF  0.5 mL Intramuscular Tomorrow-1000  .      . insulin aspart  12 Units Subcutaneous TID WC  . insulin glargine  40 Units Subcutaneous QHS  . metoprolol tartrate  12.5 mg Oral BID  . pantoprazole  40 mg Oral Daily  . pneumococcal 23 valent vaccine  0.5 mL Intramuscular Tomorrow-1000  . potassium chloride SA  20 mEq Oral BID  . sodium chloride flush  3 mL Intravenous Q12H  . warfarin  6 mg Oral Daily  . Warfarin - Pharmacist Dosing Inpatient   Does not apply q1800     Social history Social History  Substance Use Topics  . Smoking status: Former Games developer  . Smokeless tobacco: None  . Alcohol Use: No     Family history Family History  Problem Relation Age of Onset  . Cancer Mother     deceased  . Cancer Father     deceased     Review of systems CV: no chest pain or palpitations PULM: no cough or shortness of breath ABD: no  abdominal pain or nausea or vomiting   Physical Exam BP 89/57 mmHg  Pulse 102  Temp(Src) 98.2 F (36.8 C) (Oral)  Resp 19  Ht 5\' 4"  (1.626 m)  Wt 142.9 kg (315 lb 0.6 oz)  BMI 54.05 kg/m2  SpO2 97%  GEN: Morbidly obese AA female, in NAD. HEENT: No proptosis, EOMI, lid lag or stare. Oropharynx is clear.  NECK: supple, trachea midline.   RESPIRATORY: clear bilaterally, no wheeze, good inspiratory effort. CV: No carotid bruits, RRR. MUSCULOSKELETAL: no clubbing, no tremor ABD: soft, NT/ND, no organomegaly EXT: no peripheral edema SKIN: no dermatopathy or rash or acanthosis nigricans.  LYMPH: no submandibular or supraclavicular LAD NEURO: PERRL, 2+ knee DTRs equal and symmetric.  PSYC: alert and oriented, good insight  Labs Results for orders placed or performed during the hospital encounter of 08/25/15 (from the past 24 hour(s))  Glucose, capillary     Status: Abnormal   Collection Time: 08/25/15  3:37 PM  Result Value Ref Range   Glucose-Capillary 225 (H) 65 - 99 mg/dL  Glucose, capillary     Status: Abnormal   Collection Time: 08/25/15  4:33 PM  Result Value Ref Range   Glucose-Capillary 232 (H) 65 - 99 mg/dL  Basic metabolic panel  Status: Abnormal   Collection Time: 08/25/15  5:38 PM  Result Value Ref Range   Sodium 129 (L) 135 - 145 mmol/L   Potassium 3.5 3.5 - 5.1 mmol/L   Chloride 93 (L) 101 - 111 mmol/L   CO2 30 22 - 32 mmol/L   Glucose, Bld 152 (H) 65 - 99 mg/dL   BUN 35 (H) 6 - 20 mg/dL   Creatinine, Ser 1.61 (H) 0.44 - 1.00 mg/dL   Calcium 8.8 (L) 8.9 - 10.3 mg/dL   GFR calc non Af Amer 39 (L) >60 mL/min   GFR calc Af Amer 45 (L) >60 mL/min   Anion gap 6 5 - 15  Glucose, capillary     Status: Abnormal   Collection Time: 08/25/15  5:46 PM  Result Value Ref Range   Glucose-Capillary 162 (H) 65 - 99 mg/dL  Glucose, capillary     Status: Abnormal   Collection Time: 08/25/15  7:22 PM  Result Value Ref Range   Glucose-Capillary 145 (H) 65 - 99 mg/dL   Basic metabolic panel     Status: Abnormal   Collection Time: 08/25/15  8:28 PM  Result Value Ref Range   Sodium 130 (L) 135 - 145 mmol/L   Potassium 3.7 3.5 - 5.1 mmol/L   Chloride 97 (L) 101 - 111 mmol/L   CO2 27 22 - 32 mmol/L   Glucose, Bld 148 (H) 65 - 99 mg/dL   BUN 35 (H) 6 - 20 mg/dL   Creatinine, Ser 0.96 (H) 0.44 - 1.00 mg/dL   Calcium 8.7 (L) 8.9 - 10.3 mg/dL   GFR calc non Af Amer 47 (L) >60 mL/min   GFR calc Af Amer 54 (L) >60 mL/min   Anion gap 6 5 - 15  Glucose, capillary     Status: Abnormal   Collection Time: 08/25/15  8:38 PM  Result Value Ref Range   Glucose-Capillary 158 (H) 65 - 99 mg/dL  Glucose, capillary     Status: Abnormal   Collection Time: 08/25/15 10:05 PM  Result Value Ref Range   Glucose-Capillary 146 (H) 65 - 99 mg/dL  Glucose, capillary     Status: Abnormal   Collection Time: 08/25/15 11:08 PM  Result Value Ref Range   Glucose-Capillary 206 (H) 65 - 99 mg/dL  Glucose, capillary     Status: Abnormal   Collection Time: 08/26/15  3:09 AM  Result Value Ref Range   Glucose-Capillary 196 (H) 65 - 99 mg/dL  Basic metabolic panel     Status: Abnormal   Collection Time: 08/26/15  3:30 AM  Result Value Ref Range   Sodium 129 (L) 135 - 145 mmol/L   Potassium 2.8 (LL) 3.5 - 5.1 mmol/L   Chloride 96 (L) 101 - 111 mmol/L   CO2 25 22 - 32 mmol/L   Glucose, Bld 207 (H) 65 - 99 mg/dL   BUN 28 (H) 6 - 20 mg/dL   Creatinine, Ser 0.45 (H) 0.44 - 1.00 mg/dL   Calcium 8.5 (L) 8.9 - 10.3 mg/dL   GFR calc non Af Amer 59 (L) >60 mL/min   GFR calc Af Amer >60 >60 mL/min   Anion gap 8 5 - 15  Glucose, capillary     Status: Abnormal   Collection Time: 08/26/15  7:36 AM  Result Value Ref Range   Glucose-Capillary 216 (H) 65 - 99 mg/dL  Protime-INR     Status: Abnormal   Collection Time: 08/26/15 10:18 AM  Result Value Ref Range  Prothrombin Time 21.6 (H) 11.4 - 15.0 seconds   INR 1.89   Glucose, capillary     Status: Abnormal   Collection Time: 08/26/15  11:37 AM  Result Value Ref Range   Glucose-Capillary 358 (H) 65 - 99 mg/dL  Glucose, capillary     Status: Abnormal   Collection Time: 08/26/15 12:19 PM  Result Value Ref Range   Glucose-Capillary 365 (H) 65 - 99 mg/dL   Comment 1 Notify RN    Comment 2 Document in Chart    Lab Results  Component Value Date   HGBA1C 11.9* 08/25/2015    Assessment DM type 2, new onset DM type 2 with hyperglycemia, uncontrolled Morbid obesity Acute kindey injury / Stage 3 CKD, improving  Plan 1. Continue Lantus - move dosing to bedtime and continue 40 units qhs for now  2. Adjust NovoLog to 18 units tid AC. 3. Add NovoLog correction SSI qAC. No SSI at bedtime. At time of discharge, please write out instructions for SSI. 4. Low carb diet. 5. Encouraged her to get a glucometer at hospital discharge and monitor sugars TID AC. 6. Will arrange for out-patient follow up in Endocrinology at [facility] next week.  At time of discharge please ensure she has the following prescriptions: Lantus SoloStar pen , take 40 units qhs #15 ml  NovoLog Flexpen, take 18-28 units tid AC as directed #15 ml Insulin pen needles for use qid #100 One Touch Ultra glucometer, use as directed #1 One Touch Ultra test strips, use 1 strip TID qAC #100 Delica lancets, use 1 TID qAC #100  Endocrinology service not available over the upcoming weekend. Will evaluate the patient in follow up next week, if she remains in hospital.

## 2015-08-26 NOTE — Progress Notes (Signed)
Initial Nutrition Assessment    INTERVENTION:   Education: provided verbal/written education on carb controlled diabetic diet to pt and family; reviewed sources of carbohydrates, basic carb counting and label reading, meal planning. Pt receptive to education, adherence likely   NUTRITION DIAGNOSIS:   Food and nutrition related knowledge deficit related to limited prior education as evidenced by  (consult for diet education).  GOAL:   Patient will meet greater than or equal to 90% of their needs  MONITOR:   PO intake, Weight trends, Other (Comment), Labs (Knowledge)  REASON FOR ASSESSMENT:   Consult Diet education  ASSESSMENT:    Pt admitted with hyperglycemia with new onset DM  Past Medical History  Diagnosis Date  . Hypertension   . COPD (chronic obstructive pulmonary disease) (HCC)   . Chronic respiratory failure (HCC)   . Gout   . A-fib (HCC)   . Sleep apnea   . Obesity     Diet Order:  Diet Carb Modified Fluid consistency:: Thin; Room service appropriate?: Yes   Energy Intake: appetite good, recorded po intake 100% of meals  Last BM:  3/16   Meds: ss novolog, lantus, novolog with meals   Recent Labs Lab 08/25/15 1738 08/25/15 2028 08/26/15 0330  NA 129* 130* 129*  K 3.5 3.7 2.8*  CL 93* 97* 96*  CO2 30 27 25   BUN 35* 35* 28*  CREATININE 1.45* 1.25* 1.03*  CALCIUM 8.8* 8.7* 8.5*  GLUCOSE 152* 148* 207*    Glucose Profile:  Recent Labs  08/26/15 0736 08/26/15 1137 08/26/15 1219  GLUCAP 216* 358* 365*   Height:   Ht Readings from Last 1 Encounters:  08/25/15 5\' 4"  (1.626 m)    Weight: pt reports wt has been stable  Wt Readings from Last 1 Encounters:  08/25/15 315 lb 0.6 oz (142.9 kg)    BMI:  Body mass index is 54.05 kg/(m^2).  EDUCATION NEEDS:   Education needs addressed  LOW Care Level  Romelle Starcher MS, [place], LDN [phone] Pager  [phone] Weekend/On-Call Pager

## 2015-08-26 NOTE — Discharge Instructions (Signed)
Diabetes follow up Thursday, March 23rd 1115am with Dr Ronne Binning at [facility]

## 2015-08-26 NOTE — Progress Notes (Addendum)
Inpatient Diabetes Program Recommendations  AACE/ADA: New Consensus Statement on Inpatient Glycemic Control (2015)  Target Ranges:  Prepandial:   less than 140 mg/dL      Peak postprandial:   less than 180 mg/dL (1-2 hours)      Critically ill patients:  140 - 180 mg/dL   Results for Dennard, NEEYA (MRN [id number]) as of 08/26/2015 13:33  Ref. Range 08/26/2015 07:36 08/26/2015 11:37 08/26/2015 12:19  Glucose-Capillary Latest Ref Range: 65-99 mg/dL 216 (H) 358 (H) 365 (H)    Current Insulin Orders: Lantus 40 units QHS        Novolog 0-10 units TID AC        Novolog 18 units tidwc     Educated patient and her sister on insulin pen use at home.  Reviewed contents of insulin flexpen starter kit.  Reviewed all steps of insulin pen including attachment of needle, 2-unit air shot, dialing up dose, giving injection, removing needle, disposal of sharps, storage of unused insulin, disposal of insulin etc.  Patient able to provide successful return demonstration.  Reviewed troubleshooting with insulin pen.  Also reviewed Signs/Symptoms of Hypoglycemia with patient and how to treat Hypoglycemia at home.  Also reviewed s/sxs of Hyperglycemia and when to call MD with elevated CBGs.  Have asked RNs caring for patient to please allow patient to give all injections here in hospital as much as possible for practice.    Also discussed with patient and sister the difference between Lantus and Novolog and how each insulin works, when to give, etc.  Encouraged patient to double check each time she gives insulin to make sure she is giving the right insulin, at the right time, in the right amount.   MD- Please give patient Rxs for insulin pens and insulin pen needles at time of discharge.  Patient would prefer insulin pens.  1. Lantus insulin pen- Order # [id number]  2. Novolog  insulin pen- Order # [id number]  5.  Insulin pen needles- Order # [id number]  6. CBG Meter Rx and supplies- Order # [id number]     --Will follow  patient during hospitalization--  [name] RN, MSN, CDE Diabetes Coordinator Inpatient Glycemic Control Team Team Pager: [phone] (8a-5p)

## 2015-08-26 NOTE — Progress Notes (Signed)
Dr. Elpidio Anis aware that BP is still low and CBG is still reading high. MD to adjust orders. Patient to stay overnight.

## 2015-08-26 NOTE — Progress Notes (Signed)
Patient arrived to 2A. No complaints at this time. Bed in low position with belongings in reach, bed alarm on for now. Skin assessment with Greer Ee, RN. Tele box 40-29 verified with CCMD. Focussed assessment as charted. Will continue to monitor.

## 2015-08-26 NOTE — Progress Notes (Signed)
Report called to Taylor, RN on 2A.  

## 2015-08-26 NOTE — Progress Notes (Addendum)
A&Ox4.  Sister here to visit and went to new room.  Denies pain. VSS.  Patient moved to room 246 by bed with Annice Pih, NT at this time with no distress noted. 2A tele monitor applied to patient and verified with CCMD prior to moving patient from ICU.

## 2015-08-26 NOTE — Progress Notes (Signed)
Patient's CBG 170

## 2015-08-27 DIAGNOSIS — E1310 Other specified diabetes mellitus with ketoacidosis without coma: Secondary | ICD-10-CM | POA: Diagnosis not present

## 2015-08-27 LAB — GLUCOSE, CAPILLARY
Glucose-Capillary: 170 mg/dL — ABNORMAL HIGH (ref 65–99)
Glucose-Capillary: 207 mg/dL — ABNORMAL HIGH (ref 65–99)
Glucose-Capillary: 211 mg/dL — ABNORMAL HIGH (ref 65–99)
Glucose-Capillary: 168 mg/dL — ABNORMAL HIGH (ref 65–99)
Glucose-Capillary: 122 mg/dL — ABNORMAL HIGH (ref 65–99)

## 2015-08-27 LAB — PROTIME-INR
INR: 1.93
PROTHROMBIN TIME: 22 s — AB (ref 11.4–15.0)

## 2015-08-27 LAB — BASIC METABOLIC PANEL
ANION GAP: 7 (ref 5–15)
BUN: 18 mg/dL (ref 6–20)
CALCIUM: 8.6 mg/dL — AB (ref 8.9–10.3)
CHLORIDE: 100 mmol/L — AB (ref 101–111)
CO2: 25 mmol/L (ref 22–32)
Creatinine, Ser: 0.93 mg/dL (ref 0.44–1.00)
GFR calc non Af Amer: >60 mL/min (ref >60)
Glucose, Bld: 207 mg/dL — ABNORMAL HIGH (ref 65–99)
POTASSIUM: 3.1 mmol/L — AB (ref 3.5–5.1)
Sodium: 132 mmol/L — ABNORMAL LOW (ref 135–145)

## 2015-08-27 LAB — MAGNESIUM: MAGNESIUM: 1.7 mg/dL (ref 1.7–2.4)

## 2015-08-27 MED ORDER — INSULIN GLARGINE 100 UNIT/ML ~~LOC~~ SOLN
45.0000 [IU] | Freq: Every day | SUBCUTANEOUS | Status: DC
Start: 2015-08-27 — End: 2015-08-28
  Administered 2015-08-27: 45 [IU] via SUBCUTANEOUS
  Filled 2015-08-27 (×2): qty 0.45

## 2015-08-27 MED ORDER — WARFARIN SODIUM 6 MG PO TABS
6.0000 mg | ORAL_TABLET | Freq: Every day | ORAL | Status: DC
  Administered 2015-08-27 – 2015-08-28 (×2): 6 mg via ORAL
  Filled 2015-08-27 (×2): qty 1

## 2015-08-27 NOTE — Progress Notes (Signed)
[facility] - [place] at [facility]   PATIENT NAME: Emily Fry    MR#:  [id number]  DATE OF BIRTH:  04-15-1956  SUBJECTIVE:  CHIEF COMPLAINT:   Chief Complaint  Patient presents with  . Hyperglycemia   Admitted for DKA. New onset DM. Off insulin drip, now on insulin, Lantus, blood glucose levels are above 200s. Still BP remains low, sodium as well as potassium level have improved, but not normalized. The patient complains of feeling weak, nauseated. Physical therapy consultation is pending  REVIEW OF SYSTEMS:    Review of Systems  Constitutional: Positive for malaise/fatigue. Negative for fever and chills.  HENT: Negative for sore throat.   Eyes: Negative for blurred vision, double vision and pain.  Respiratory: Negative for cough, hemoptysis, shortness of breath and wheezing.   Cardiovascular: Negative for chest pain, palpitations, orthopnea and leg swelling.  Gastrointestinal: Negative for heartburn, nausea, vomiting, abdominal pain, diarrhea and constipation.  Genitourinary: Negative for dysuria and hematuria.  Musculoskeletal: Negative for back pain and joint pain.  Skin: Negative for rash.  Neurological: Negative for sensory change, speech change, focal weakness and headaches.  Endo/Heme/Allergies: Does not bruise/bleed easily.  Psychiatric/Behavioral: Negative for depression. The patient is not nervous/anxious.     DRUG ALLERGIES:   Allergies  Allergen Reactions  . Contrast Media [Iodinated Diagnostic Agents] Swelling    VITALS:  Blood pressure 97/53, pulse 87, temperature 97.6 F (36.4 C), temperature source Oral, resp. rate 18, height 5\' 4"  (1.626 m), weight 142.9 kg (315 lb 0.6 oz), SpO2 96 %.  PHYSICAL EXAMINATION:   Physical Exam  GENERAL:  60 y.o.-year-old patient lying in the bed with no acute distress. Obese EYES: Pupils equal, round, reactive to light and accommodation. No scleral icterus. Extraocular muscles intact.   HEENT: Head atraumatic, normocephalic. Oropharynx and nasopharynx clear.  NECK:  Supple, no jugular venous distention. No thyroid enlargement, no tenderness.  LUNGS: Normal breath sounds bilaterally, no wheezing, rales, rhonchi. No use of accessory muscles of respiration.  CARDIOVASCULAR: S1, S2 normal. No murmurs, rubs, or gallops.  ABDOMEN: Soft, nontender, nondistended. Bowel sounds present. No organomegaly or mass.  EXTREMITIES: No cyanosis, clubbing or edema b/l.    NEUROLOGIC: Cranial nerves II through XII are intact. No focal Motor or sensory deficits b/l.   PSYCHIATRIC: The patient is alert and oriented x 3.  SKIN: No obvious rash, lesion, or ulcer.   LABORATORY PANEL:   CBC  Recent Labs Lab 08/25/15 0924  WBC 6.1  HGB 12.6  HCT 37.6  PLT 173   ------------------------------------------------------------------------------------------------------------------ Chemistries   Recent Labs Lab 08/27/15 0620  NA 132*  K 3.1*  CL 100*  CO2 25  GLUCOSE 207*  BUN 18  CREATININE 0.93  CALCIUM 8.6*  MG 1.7   ------------------------------------------------------------------------------------------------------------------  Cardiac Enzymes No results for input(s): TROPONINI in the last 168 hours. ------------------------------------------------------------------------------------------------------------------  RADIOLOGY:  No results found.   ASSESSMENT AND PLAN:   * New onset DM with DKA DKA has resolved Advance insulin Lantus to 45 units daily. Continue Novolog at 18 units . For now, advance as needed, patient is to follow-up with Dr. Tedd Sias as outpatient. BS >350  * Hypotension Likely from dehydartion Restarted Metoprolol with h/o Afib. Pt is in sinus tach. Restart Cartia when BP improves. Baseline BP is SBP 120s reviewing PCP notes.  * Chronic diastolic chf No sign of fluid overload  * ARF has resolved  * COPD with chronic resp failure Stable  *  Hypokalemia Replacing  PO, proving  *Hyponatremia, improving with therapy, follow in the morning  Likely d/c in AM if BP and blood sugars are  improved   All the records are reviewed and case discussed with Care Management/Social Workerr. Management plans discussed with the patient, family and they are in agreement.  CODE STATUS: FULL  DVT Prophylaxis: SCDs  TOTAL TIME TAKING CARE OF THIS PATIENT: 35 minutes.   POSSIBLE D/C IN 1-2 DAYS, DEPENDING ON CLINICAL CONDITION.  Katharina Caper M.D on 08/27/2015 at 2:00 PM  Between 7am to 6pm - Pager - [phone]  After 6pm go to [email or web address] - password [email or web address]  [facility]  Office  [phone]  CC: Primary care physician; Lauro Regulus., MD  Note: This dictation was prepared with Dragon dictation along with smaller phrase technology. Any transcriptional errors that result from this process are unintentional.

## 2015-08-27 NOTE — Progress Notes (Signed)
ANTICOAGULATION CONSULT NOTE - Follow up  Pharmacy Consult for Warfarin Indication: atrial fibrillation  Allergies  Allergen Reactions  . Contrast Media [Iodinated Diagnostic Agents] Swelling    Patient Measurements: Height: 5\' 4"  (162.6 cm) Weight: (!) 315 lb 0.6 oz (142.9 kg) IBW/kg (Calculated) : 54.7  Vital Signs: Temp: 97.8 F (36.6 C) (03/18 0504) BP: 99/65 mmHg (03/18 0504) Pulse Rate: 85 (03/18 0504)  Labs:  Recent Labs  08/25/15 0214 08/25/15 0924  08/25/15 2028 08/26/15 0330 08/26/15 1018 08/27/15 0620  HGB 13.1 12.6  --   --   --   --   --   HCT 41.1 37.6  --   --   --   --   --   PLT 166 173  --   --   --   --   --   LABPROT  --   --   --   --   --  21.6* 22.0*  INR  --   --   --   --   --  1.89 1.93  CREATININE 1.64* 1.40*  < > 1.25* 1.03*  --  0.93  < > = values in this interval not displayed.  Estimated Creatinine Clearance: 94.8 mL/min (by C-G formula based on Cr of 0.93).   Medical History: Past Medical History  Diagnosis Date  . Hypertension   . COPD (chronic obstructive pulmonary disease) (HCC)   . Chronic respiratory failure (HCC)   . Gout   . A-fib (HCC)   . Sleep apnea   . Obesity     Medications:  Scheduled:  . aspirin EC  81 mg Oral Daily  . diltiazem  240 mg Oral QHS  . enoxaparin (LOVENOX) injection  40 mg Subcutaneous Q12H  . Influenza vac split quadrivalent PF  0.5 mL Intramuscular Tomorrow-1000  . insulin aspart  0-10 Units Subcutaneous TID WC  . insulin aspart  18 Units Subcutaneous TID WC  . insulin glargine  40 Units Subcutaneous QHS  . metoprolol tartrate  12.5 mg Oral BID  . pantoprazole  40 mg Oral Daily  . pneumococcal 23 valent vaccine  0.5 mL Intramuscular Tomorrow-1000  . potassium chloride SA  20 mEq Oral BID  . sodium chloride flush  3 mL Intravenous Q12H  . warfarin  6 mg Oral q1800  . Warfarin - Pharmacist Dosing Inpatient   Does not apply q1800   Infusions:  . insulin (NOVOLIN-R) infusion Stopped  (08/25/15 1934)    Assessment: Pharmacy consulted to manage warfarin dosing for this 60 yo female admitted for nonketotic hyperosmolar hyperglycemia, hyponatremia, hypokalemia, and AKI.  Patient takes warfarin 6mg  daily at home.   Goal of Therapy:  INR 2-3   Plan:  Will continue warfarin 6mg  PO daily.  Will order INR for tomorrow morning and adjust dose as per consult.   Stormy Card, RPh Clinical Pharmacist 08/27/2015,8:00 AM

## 2015-08-27 NOTE — Progress Notes (Signed)
Pt supervised while giving herself insulin shots. Her technique very good. She handles the equipment safely.

## 2015-08-28 DIAGNOSIS — E876 Hypokalemia: Secondary | ICD-10-CM

## 2015-08-28 DIAGNOSIS — E871 Hypo-osmolality and hyponatremia: Secondary | ICD-10-CM

## 2015-08-28 LAB — BASIC METABOLIC PANEL
ANION GAP: 4 — AB (ref 5–15)
Anion gap: 6 (ref 5–15)
BUN: 16 mg/dL (ref 6–20)
BUN: 17 mg/dL (ref 6–20)
CALCIUM: 8.5 mg/dL — AB (ref 8.9–10.3)
CALCIUM: 8.4 mg/dL — AB (ref 8.9–10.3)
CHLORIDE: 101 mmol/L (ref 101–111)
CO2: 26 mmol/L (ref 22–32)
CO2: 24 mmol/L (ref 22–32)
CREATININE: 1.08 mg/dL — AB (ref 0.44–1.00)
Chloride: 102 mmol/L (ref 101–111)
Creatinine, Ser: 0.91 mg/dL (ref 0.44–1.00)
GFR calc non Af Amer: >60 mL/min (ref >60)
GFR calc non Af Amer: 56 mL/min — ABNORMAL LOW (ref >60)
Glucose, Bld: 150 mg/dL — ABNORMAL HIGH (ref 65–99)
Glucose, Bld: 211 mg/dL — ABNORMAL HIGH (ref 65–99)
Potassium: 3.2 mmol/L — ABNORMAL LOW (ref 3.5–5.1)
Potassium: 4 mmol/L (ref 3.5–5.1)
SODIUM: 132 mmol/L — AB (ref 135–145)
Sodium: 131 mmol/L — ABNORMAL LOW (ref 135–145)

## 2015-08-28 LAB — PROTIME-INR
INR: 1.4
Prothrombin Time: 17.3 seconds — ABNORMAL HIGH (ref 11.4–15.0)

## 2015-08-28 LAB — GLUCOSE, CAPILLARY
GLUCOSE-CAPILLARY: 125 mg/dL — AB (ref 65–99)
Glucose-Capillary: 179 mg/dL — ABNORMAL HIGH (ref 65–99)
Glucose-Capillary: 74 mg/dL (ref 65–99)

## 2015-08-28 MED ORDER — POTASSIUM CHLORIDE IN NACL 20-0.9 MEQ/L-% IV SOLN
INTRAVENOUS | Status: DC
  Administered 2015-08-28: 12:00:00 via INTRAVENOUS
  Filled 2015-08-28 (×3): qty 1000

## 2015-08-28 MED ORDER — MAGNESIUM OXIDE 400 (241.3 MG) MG PO TABS
400.0000 mg | ORAL_TABLET | Freq: Every day | ORAL | Status: DC
  Administered 2015-08-28: 400 mg via ORAL
  Filled 2015-08-28: qty 1

## 2015-08-28 MED ORDER — INSULIN ASPART 100 UNIT/ML ~~LOC~~ SOLN: 0.0000 [IU] | Freq: Three times a day (TID) | SUBCUTANEOUS | Status: DC

## 2015-08-28 MED ORDER — INSULIN GLARGINE 100 UNIT/ML SOLOSTAR PEN: 45.0000 [IU] | PEN_INJECTOR | Freq: Every day | SUBCUTANEOUS | Status: DC

## 2015-08-28 MED ORDER — INSULIN ASPART 100 UNIT/ML FLEXPEN: 18.0000 [IU] | PEN_INJECTOR | Freq: Three times a day (TID) | SUBCUTANEOUS | Status: DC

## 2015-08-28 MED ORDER — INSULIN ASPART 100 UNIT/ML ~~LOC~~ SOLN: 18.0000 [IU] | Freq: Three times a day (TID) | SUBCUTANEOUS | Status: DC

## 2015-08-28 MED ORDER — MAGNESIUM OXIDE 400 (241.3 MG) MG PO TABS: 400.0000 mg | ORAL_TABLET | Freq: Every day | ORAL | Status: DC

## 2015-08-28 NOTE — Progress Notes (Signed)
ANTICOAGULATION CONSULT NOTE - Follow up  Pharmacy Consult for Warfarin Indication: atrial fibrillation  Allergies  Allergen Reactions  . Contrast Media [Iodinated Diagnostic Agents] Swelling    Patient Measurements: Height: 5\' 4"  (162.6 cm) Weight: (!) 315 lb 0.6 oz (142.9 kg) IBW/kg (Calculated) : 54.7  Vital Signs: Temp: 97.9 F (36.6 C) (03/19 0340) Temp Source: Oral (03/19 0340) BP: 121/59 mmHg (03/19 0340) Pulse Rate: 81 (03/19 0500)  Labs:  Recent Labs  08/25/15 0924  08/26/15 0330 08/26/15 1018 08/27/15 0620 08/28/15 0522  HGB 12.6  --   --   --   --   --   HCT 37.6  --   --   --   --   --   PLT 173  --   --   --   --   --   LABPROT  --   --   --  21.6* 22.0* 17.3*  INR  --   --   --  1.89 1.93 1.40  CREATININE 1.40*  < > 1.03*  --  0.93 0.91  < > = values in this interval not displayed.  Estimated Creatinine Clearance: 96.9 mL/min (by C-G formula based on Cr of 0.91).   Medical History: Past Medical History  Diagnosis Date  . Hypertension   . COPD (chronic obstructive pulmonary disease) (HCC)   . Chronic respiratory failure (HCC)   . Gout   . A-fib (HCC)   . Sleep apnea   . Obesity     Medications:  Scheduled:  . aspirin EC  81 mg Oral Daily  . diltiazem  240 mg Oral QHS  . enoxaparin (LOVENOX) injection  40 mg Subcutaneous Q12H  . insulin aspart  0-10 Units Subcutaneous TID WC  . insulin aspart  18 Units Subcutaneous TID WC  . insulin glargine  45 Units Subcutaneous QHS  . magnesium oxide  400 mg Oral Daily  . metoprolol tartrate  12.5 mg Oral BID  . pantoprazole  40 mg Oral Daily  . potassium chloride SA  20 mEq Oral BID  . sodium chloride flush  3 mL Intravenous Q12H  . warfarin  6 mg Oral q1800  . Warfarin - Pharmacist Dosing Inpatient   Does not apply q1800   Infusions:  . insulin (NOVOLIN-R) infusion Stopped (08/25/15 1934)    Assessment: Pharmacy consulted to manage warfarin dosing for this 60 yo female admitted for nonketotic  hyperosmolar hyperglycemia, hyponatremia, hypokalemia, and AKI.  Patient takes warfarin 6mg  daily at home.  Also ordered Lovenox 40mg  SQ Q12H.   3/17 INR 1.89, Coumadin 6mg   3/18 INR 1.93, Coumadin 6mg  3/19 INR 1.4    Goal of Therapy:  INR 2-3   Plan:  Will continue warfarin 6mg  PO daily.  Will order INR for tomorrow morning and adjust dose as per consult.   Stormy Card, RPh Clinical Pharmacist 08/28/2015,7:42 AM

## 2015-08-28 NOTE — Progress Notes (Signed)
PT Cancellation Note  Patient Details Name: Emily Fry MRN: [id number] DOB: 09/18/1955   Cancelled Treatment:    Reason Eval/Treat Not Completed: PT screened, no needs identified, will sign off. Pt reports no acute changes to strength, balance, or functional indep. The patient mentions some chronic/subacute symptoms that are consistent with diabetic peripheral neuropathy. I spent 5 minutes discussing the clinical picture of diabetic neuropathy, the importance of good adherence to diet compliance, medications, and other suggestions from provider. She declines additional need for PT at this time. Will sign off. Encouraged to FU with endocrinologist and/or PCP if distal extremity paresthesia progresses and/or becomes more concerning.    10:59 AM, 08/28/2015 Rosamaria Lints, PT, DPT PRN Physical Therapist - [facility] License # [id number] [phone] (mobile)

## 2015-08-28 NOTE — Discharge Summary (Signed)
[facility] - [place] at [facility]   PATIENT NAME: Emily Fry    MR#:  [id number]  DATE OF BIRTH:  06/04/1956  DATE OF ADMISSION:  08/25/2015 ADMITTING PHYSICIAN: Ihor Austin, MD  DATE OF DISCHARGE: No discharge date for patient encounter.  PRIMARY CARE PHYSICIAN: Lauro Regulus., MD     ADMISSION DIAGNOSIS:  Dehydration [E86.0] Hypokalemia [E87.6] Hyperglycemia [R73.9]  DISCHARGE DIAGNOSIS:  Principal Problem:   Type 2 diabetes mellitus with hyperosmolar nonketotic hyperglycemia (HCC) Active Problems:   Hyperosmolarity syndrome   Hyponatremia   Hypokalemia   SECONDARY DIAGNOSIS:   Past Medical History  Diagnosis Date  . Hypertension   . COPD (chronic obstructive pulmonary disease) (HCC)   . Chronic respiratory failure (HCC)   . Gout   . A-fib (HCC)   . Sleep apnea   . Obesity     .pro HOSPITAL COURSE:   Patient is 60 year old female with past medical history significant for history of obesity, hypertension, COPD, chronic respiratory failure, atrial fibrillation, obstructive sleep apnea, who presents to the hospital with complaints of polyuria, polydipsia,  elevated blood glucose levels to 950 mg in deciliter. Patient was admitted to the hospital for further evaluation and treatment. She was initiated on IV fluids, insulin drip, which was transitioned to insulin Lantus. Endocrinology consultation, lifestyle Center evaluation was obtained while patient was in the hospital. Patient's Lantus was advanced to 45 units subcutaneously daily with NovoLog 18 units subcutaneously 3 times daily with meals and sliding scale. Patient's blood glucose levels improved as well as her condition. She was evaluated by physical therapist and no needs were identified.  Discussion by problem * New onset DM without DKA, but with a hyperosmolar hyperglycemia, patient's hemoglobin A1c was found to be 11.9 Continue insulin Lantus at 45 units daily. Continue  Novolog at 18 units . The  patient is to follow-up with Dr. Tedd Sias as outpatient.  * Hypotension Likely due to dehydartion Patient was recommended to restart metoprolol and Cartia as outpatient, following blood pressure readings closely.   * Chronic diastolic chf No sign of fluid overload, holding patient's diuretics until the patient is seen by primary care physician/cardiologist as outpatient due to hypotension  * ARF has resolved on IV fluid administration, creatinine was 1.08 on the day of discharge  * COPD with chronic resp failure Stable  * Hypokalemia Replaced PO  *Hyponatremia, improved with IV fluids, holding diuretics, follow as outpatient  DISCHARGE CONDITIONS:   Stable  CONSULTS OBTAINED:  Treatment Team:  Abby Lanetta Inch, MD  DRUG ALLERGIES:   Allergies  Allergen Reactions  . Contrast Media [Iodinated Diagnostic Agents] Swelling    DISCHARGE MEDICATIONS:   Current Discharge Medication List    START taking these medications   Details  insulin aspart (NOVOLOG) 100 UNIT/ML FlexPen Inject 18 Units into the skin 3 (three) times daily with meals. Qty: 15 mL, Refills: 11    Insulin Glargine (LANTUS) 100 UNIT/ML Solostar Pen Inject 45 Units into the skin daily at 10 pm. Qty: 15 mL, Refills: 11    magnesium oxide (MAG-OX) 400 (241.3 Mg) MG tablet Take 1 tablet (400 mg total) by mouth daily. Qty: 30 tablet, Refills: 5      CONTINUE these medications which have NOT CHANGED   Details  aspirin EC 81 MG tablet Take 81 mg by mouth daily.    CARTIA XT 240 MG 24 hr capsule Take 240 mg by mouth at bedtime. Refills: 0    metoprolol tartrate (LOPRESSOR) 25  MG tablet Take 12.5 mg by mouth 2 (two) times daily. Refills: 0    pantoprazole (PROTONIX) 40 MG tablet Take 40 mg by mouth daily. Refills: 11    simvastatin (ZOCOR) 40 MG tablet Take 40 mg by mouth daily. Refills: 3    warfarin (COUMADIN) 6 MG tablet Take 6 mg by mouth daily.      STOP taking  these medications     acetaZOLAMIDE (DIAMOX) 250 MG tablet      furosemide (LASIX) 80 MG tablet      metolazone (ZAROXOLYN) 2.5 MG tablet      potassium chloride SA (K-DUR,KLOR-CON) 20 MEQ tablet          DISCHARGE INSTRUCTIONS:    Patient is to follow-up with primary care physician, an endocrinologist as outpatient. She is to hold diuretics for time being due to concerns of dehydration due to brittle diabetes  If you experience worsening of your admission symptoms, develop shortness of breath, life threatening emergency, suicidal or homicidal thoughts you must seek medical attention immediately by calling 911 or calling your MD immediately  if symptoms less severe.  You Must read complete instructions/literature along with all the possible adverse reactions/side effects for all the Medicines you take and that have been prescribed to you. Take any new Medicines after you have completely understood and accept all the possible adverse reactions/side effects.   Please note  You were cared for by a hospitalist during your hospital stay. If you have any questions about your discharge medications or the care you received while you were in the hospital after you are discharged, you can call the unit and asked to speak with the hospitalist on call if the hospitalist that took care of you is not available. Once you are discharged, your primary care physician will handle any further medical issues. Please note that NO REFILLS for any discharge medications will be authorized once you are discharged, as it is imperative that you return to your primary care physician (or establish a relationship with a primary care physician if you do not have one) for your aftercare needs so that they can reassess your need for medications and monitor your lab values.    Today   CHIEF COMPLAINT:   Chief Complaint  Patient presents with  . Hyperglycemia    HISTORY OF PRESENT ILLNESS:  Emily Fry  is a  60 y.o. female with a known history of obesity, hypertension, COPD, chronic respiratory failure, atrial fibrillation, obstructive sleep apnea, who presents to the hospital with complaints of polyuria, polydipsia,  elevated blood glucose levels to 950 mg in deciliter. Patient was admitted to the hospital for further evaluation and treatment. She was initiated on IV fluids, insulin drip, which was transitioned to insulin Lantus. Endocrinology consultation, lifestyle Center evaluation was obtained while patient was in the hospital. Patient's Lantus was advanced to 45 units subcutaneously daily with NovoLog 18 units subcutaneously 3 times daily with meals and sliding scale. Patient's blood glucose levels improved as well as her condition. She was evaluated by physical therapist and no needs were identified.  Discussion by problem * New onset DM without DKA, but with a hyperosmolar hyperglycemia, patient's hemoglobin A1c was found to be 11.9 Continue insulin Lantus at 45 units daily. Continue Novolog at 18 units . The  patient is to follow-up with Dr. Tedd Sias as outpatient.  * Hypotension Likely due to dehydartion Patient was recommended to restart metoprolol and Cartia as outpatient, following blood pressure readings closely.   *  Chronic diastolic chf No sign of fluid overload, holding patient's diuretics until the patient is seen by primary care physician/cardiologist as outpatient due to hypotension  * ARF has resolved on IV fluid administration, creatinine was 1.08 on the day of discharge  * COPD with chronic resp failure Stable  * Hypokalemia Replaced PO  *Hyponatremia, improved with IV fluids, holding diuretics, follow as outpatient    VITAL SIGNS:  Blood pressure 101/62, pulse 152, temperature 97.8 F (36.6 C), temperature source Oral, resp. rate 22, height 5\' 4"  (1.626 m), weight 142.9 kg (315 lb 0.6 oz), SpO2 97 %.  I/O:   Intake/Output Summary (Last 24 hours) at 08/28/15 1629 Last  data filed at 08/28/15 1400  Gross per 24 hour  Intake    480 ml  Output    550 ml  Net    -70 ml    PHYSICAL EXAMINATION:  GENERAL:  60 y.o.-year-old patient lying in the bed with no acute distress.  EYES: Pupils equal, round, reactive to light and accommodation. No scleral icterus. Extraocular muscles intact.  HEENT: Head atraumatic, normocephalic. Oropharynx and nasopharynx clear.  NECK:  Supple, no jugular venous distention. No thyroid enlargement, no tenderness.  LUNGS: Normal breath sounds bilaterally, no wheezing, rales,rhonchi or crepitation. No use of accessory muscles of respiration.  CARDIOVASCULAR: S1, S2 normal. No murmurs, rubs, or gallops.  ABDOMEN: Soft, non-tender, non-distended. Bowel sounds present. No organomegaly or mass.  EXTREMITIES: No pedal edema, cyanosis, or clubbing.  NEUROLOGIC: Cranial nerves II through XII are intact. Muscle strength 5/5 in all extremities. Sensation intact. Gait not checked.  PSYCHIATRIC: The patient is alert and oriented x 3.  SKIN: No obvious rash, lesion, or ulcer.   DATA REVIEW:   CBC  Recent Labs Lab 08/25/15 0924  WBC 6.1  HGB 12.6  HCT 37.6  PLT 173    Chemistries   Recent Labs Lab 08/27/15 0620  08/28/15 1359  NA 132*  < > 132*  K 3.1*  < > 4.0  CL 100*  < > 102  CO2 25  < > 24  GLUCOSE 207*  < > 211*  BUN 18  < > 17  CREATININE 0.93  < > 1.08*  CALCIUM 8.6*  < > 8.4*  MG 1.7  --   --   < > = values in this interval not displayed.  Cardiac Enzymes No results for input(s): TROPONINI in the last 168 hours.  Microbiology Results  Results for orders placed or performed during the hospital encounter of 08/25/15  MRSA PCR Screening     Status: None   Collection Time: 08/25/15  8:50 AM  Result Value Ref Range Status   MRSA by PCR NEGATIVE NEGATIVE Final    Comment:        The GeneXpert MRSA Assay (FDA approved for NASAL specimens only), is one component of a comprehensive MRSA colonization surveillance  program. It is not intended to diagnose MRSA infection nor to guide or monitor treatment for MRSA infections.     RADIOLOGY:  No results found.  EKG:   Orders placed or performed during the hospital encounter of 08/25/15  . ED EKG  . ED EKG      Management plans discussed with the patient, family and they are in agreement.  CODE STATUS:     Code Status Orders        Start     Ordered   08/25/15 0915  Full code   Continuous  08/25/15 0914    Code Status History    Date Active Date Inactive Code Status Order ID Comments User Context   This patient has a current code status but no historical code status.      TOTAL TIME TAKING CARE OF THIS PATIENT: 40 minutes.    Katharina Caper M.D on 08/28/2015 at 4:29 PM  Between 7am to 6pm - Pager - [phone]  After 6pm go to [email or web address] - password [email or web address] [facility]  [facility]  Office  [phone]  CC: Primary care physician; Lauro Regulus., MD

## 2016-11-22 ENCOUNTER — Other Ambulatory Visit: Payer: Self-pay | Admitting: Physician Assistant

## 2016-11-22 DIAGNOSIS — E876 Hypokalemia: Secondary | ICD-10-CM

## 2016-11-22 DIAGNOSIS — R110 Nausea: Secondary | ICD-10-CM

## 2016-11-22 DIAGNOSIS — R945 Abnormal results of liver function studies: Principal | ICD-10-CM

## 2016-11-22 DIAGNOSIS — R7989 Other specified abnormal findings of blood chemistry: Secondary | ICD-10-CM

## 2016-11-23 ENCOUNTER — Ambulatory Visit
Admission: RE | Admit: 2016-11-23 | Discharge: 2016-11-23 | Disposition: A | Discharge status: Home / Self Care | Payer: Medicare Other | Source: Ambulatory Visit | Attending: Physician Assistant | Admitting: Physician Assistant

## 2016-11-23 DIAGNOSIS — R7989 Other specified abnormal findings of blood chemistry: Secondary | ICD-10-CM | POA: Diagnosis not present

## 2016-11-23 DIAGNOSIS — K8020 Calculus of gallbladder without cholecystitis without obstruction: Secondary | ICD-10-CM | POA: Insufficient documentation

## 2016-11-23 DIAGNOSIS — E876 Hypokalemia: Secondary | ICD-10-CM | POA: Insufficient documentation

## 2016-11-23 DIAGNOSIS — K7689 Other specified diseases of liver: Secondary | ICD-10-CM | POA: Diagnosis not present

## 2016-11-23 DIAGNOSIS — R945 Abnormal results of liver function studies: Secondary | ICD-10-CM

## 2016-11-23 DIAGNOSIS — R110 Nausea: Secondary | ICD-10-CM | POA: Diagnosis not present

## 2018-03-05 ENCOUNTER — Other Ambulatory Visit: Payer: Self-pay

## 2018-03-05 ENCOUNTER — Emergency Department
Admission: EM | Admit: 2018-03-05 | Discharge: 2018-03-05 | Disposition: A | Discharge status: Home / Self Care | Payer: Medicare Other | Attending: Emergency Medicine | Admitting: Emergency Medicine

## 2018-03-05 DIAGNOSIS — F329 Major depressive disorder, single episode, unspecified: Secondary | ICD-10-CM | POA: Diagnosis not present

## 2018-03-05 DIAGNOSIS — E119 Type 2 diabetes mellitus without complications: Secondary | ICD-10-CM | POA: Insufficient documentation

## 2018-03-05 DIAGNOSIS — R45851 Suicidal ideations: Secondary | ICD-10-CM | POA: Insufficient documentation

## 2018-03-05 DIAGNOSIS — Z87891 Personal history of nicotine dependence: Secondary | ICD-10-CM | POA: Insufficient documentation

## 2018-03-05 DIAGNOSIS — Z7901 Long term (current) use of anticoagulants: Secondary | ICD-10-CM | POA: Diagnosis not present

## 2018-03-05 DIAGNOSIS — R601 Generalized edema: Secondary | ICD-10-CM

## 2018-03-05 DIAGNOSIS — I10 Essential (primary) hypertension: Secondary | ICD-10-CM | POA: Diagnosis not present

## 2018-03-05 DIAGNOSIS — Z7982 Long term (current) use of aspirin: Secondary | ICD-10-CM | POA: Diagnosis not present

## 2018-03-05 DIAGNOSIS — Z794 Long term (current) use of insulin: Secondary | ICD-10-CM | POA: Diagnosis not present

## 2018-03-05 DIAGNOSIS — R531 Weakness: Secondary | ICD-10-CM

## 2018-03-05 DIAGNOSIS — J449 Chronic obstructive pulmonary disease, unspecified: Secondary | ICD-10-CM | POA: Diagnosis not present

## 2018-03-05 LAB — URINALYSIS, COMPLETE (UACMP) WITH MICROSCOPIC
Bacteria, UA: NONE SEEN
Bilirubin Urine: NEGATIVE
GLUCOSE, UA: NEGATIVE mg/dL
HGB URINE DIPSTICK: NEGATIVE
KETONES UR: NEGATIVE mg/dL
LEUKOCYTES UA: NEGATIVE
NITRITE: NEGATIVE
PROTEIN: NEGATIVE mg/dL
Specific Gravity, Urine: 1.008 (ref 1.005–1.030)
pH: 7 (ref 5.0–8.0)

## 2018-03-05 LAB — CBC WITH DIFFERENTIAL/PLATELET
BASOS ABS: 0 10*3/uL (ref 0–0.1)
BASOS PCT: 1 %
Eosinophils Absolute: 0.2 10*3/uL (ref 0–0.7)
Eosinophils Relative: 6 %
HCT: 34.9 % — ABNORMAL LOW (ref 35.0–47.0)
HEMOGLOBIN: 11.3 g/dL — AB (ref 12.0–16.0)
Lymphocytes Relative: 30 %
Lymphs Abs: 1.1 10*3/uL (ref 1.0–3.6)
MCH: 29.4 pg (ref 26.0–34.0)
MCHC: 32.3 g/dL (ref 32.0–36.0)
MCV: 91.1 fL (ref 80.0–100.0)
Monocytes Absolute: 0.5 10*3/uL (ref 0.2–0.9)
Monocytes Relative: 14 %
Neutro Abs: 1.9 10*3/uL (ref 1.4–6.5)
Neutrophils Relative %: 49 %
Platelets: 147 10*3/uL — ABNORMAL LOW (ref 150–440)
RBC: 3.83 MIL/uL (ref 3.80–5.20)
RDW: 15.1 % — ABNORMAL HIGH (ref 11.5–14.5)
WBC: 3.7 10*3/uL (ref 3.6–11.0)

## 2018-03-05 LAB — COMPREHENSIVE METABOLIC PANEL
ALT: 30 U/L (ref 0–44)
AST: 27 U/L (ref 15–41)
Albumin: 3.5 g/dL (ref 3.5–5.0)
Alkaline Phosphatase: 87 U/L (ref 38–126)
Anion gap: 4 — ABNORMAL LOW (ref 5–15)
BILIRUBIN TOTAL: 0.6 mg/dL (ref 0.3–1.2)
BUN: 19 mg/dL (ref 6–20)
CHLORIDE: 115 mmol/L — AB (ref 98–111)
CO2: 25 mmol/L (ref 22–32)
CREATININE: 1.19 mg/dL — AB (ref 0.44–1.00)
Calcium: 8.9 mg/dL (ref 8.9–10.3)
GFR, EST AFRICAN AMERICAN: 56 mL/min — AB (ref >60)
GFR, EST NON AFRICAN AMERICAN: 49 mL/min — AB (ref >60)
Glucose, Bld: 98 mg/dL (ref 70–99)
POTASSIUM: 4.2 mmol/L (ref 3.5–5.1)
Sodium: 144 mmol/L (ref 135–145)
TOTAL PROTEIN: 6.4 g/dL — AB (ref 6.5–8.1)

## 2018-03-05 LAB — BRAIN NATRIURETIC PEPTIDE: B Natriuretic Peptide: 21 pg/mL (ref 0.0–100.0)

## 2018-03-05 LAB — PROTIME-INR
INR: 1.48
PROTHROMBIN TIME: 17.8 s — AB (ref 11.4–15.2)

## 2018-03-05 LAB — GLUCOSE, CAPILLARY
GLUCOSE-CAPILLARY: 96 mg/dL (ref 70–99)
Glucose-Capillary: 86 mg/dL (ref 70–99)

## 2018-03-05 LAB — TROPONIN I

## 2018-03-05 MED ORDER — FUROSEMIDE 20 MG PO TABS: 20.0000 mg | ORAL_TABLET | Freq: Every day | ORAL | 0 refills | Status: DC

## 2018-03-05 MED ORDER — DEXTROSE 5 % IV SOLN
120.0000 mg | Freq: Once | INTRAVENOUS | Status: AC
  Administered 2018-03-05: 120 mg via INTRAVENOUS
  Filled 2018-03-05: qty 12

## 2018-03-05 NOTE — ED Notes (Addendum)
TTS at patient bedside. Pts caregiver reports pt has communicated possible harm to self. Pt denied SI during assessment

## 2018-03-05 NOTE — ED Triage Notes (Signed)
Pt arrives at ED via [facility] with c/c of fluid retention to bilateral upper and lower extremities beginning last sunday. Secondary complaint of bilateral knee pain. Pt denies any pain sensory deficits.

## 2018-03-05 NOTE — ED Provider Notes (Signed)
-----------------------------------------   9:11 PM on 03/05/2018 -----------------------------------------  I had multiple discussions with the patient and family members including the patient's Sister Annice Pih that I called at home.  They do not believe they can forward rehab or nursing placement, per sign out report from Dr. Mayford Knife after speaking to social worker they would need to pay out-of-pocket for these.  After long discussion they state they would like to go home, and attempt to obtain home health and home PT.  I will place a face-to-face as well as a case management consultation for the patient.  Given the patient's vague suicidal statement I did have the psychiatry see the patient as well they believe the patient is safe for discharge home from psychiatric standpoint, we will provide [facility] resources for the patient if she wishes to follow-up.  Overall patient appears well, she is agreeable to the plan of care to go home as well.  Patient has had significant urine output after a dose of IV Lasix.   Minna Antis, MD 03/05/18 2112

## 2018-03-05 NOTE — ED Notes (Signed)
This RN to bedside, pt states, "I need to pee so bad!". This RN explained again the purpose of the Purewick external catheter, pt able to urinate at this time without difficulty using the purewick. Pt also requesting a sandwich.
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
## 2018-03-05 NOTE — ED Provider Notes (Addendum)
[facility] Emergency Department Provider Note       Time seen: ----------------------------------------- 1:42 PM on 03/05/2018 -----------------------------------------   I have reviewed the triage vital signs and the nursing notes.  HISTORY   Chief Complaint No chief complaint on file.    HPI Emily Fry is a 62 y.o. female with a history of atrial fibrillation, chronic respiratory failure, COPD, gout, hypertension, morbid obesity, sleep apnea who presents to the ED for volume overload.  Patient feels like she needs to be admitted to the hospital to be diuresed.  She states she has not been able to use her diuretics because of her mattress.  She states is hard to get in and out of her bed.  She denies any fevers, chills, chest pain, shortness of breath, vomiting or diarrhea.  Past Medical History:  Diagnosis Date  . A-fib (HCC)   . Chronic respiratory failure (HCC)   . COPD (chronic obstructive pulmonary disease) (HCC)   . Gout   . Hypertension   . Obesity   . Sleep apnea     Patient Active Problem List   Diagnosis Date Noted  . Hyponatremia 08/28/2015  . Hypokalemia 08/28/2015  . 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 TONSILLECTOMY      Allergies Contrast media [iodinated diagnostic agents]  Social History Social History   Tobacco Use  . Smoking status: Former Smoker  Substance Use Topics  . Alcohol use: No    Alcohol/week: 0.0 standard drinks  . Drug use: No   Review of Systems Constitutional: Negative for fever. Cardiovascular: Negative for chest pain. Respiratory: Negative for shortness of breath. Gastrointestinal: Negative for abdominal pain, vomiting and diarrhea. Musculoskeletal: Positive for generalized edema Skin: Negative for rash. Neurological: Negative for headaches, focal  weakness or numbness.  All systems negative/normal/unremarkable except as stated in the HPI  ____________________________________________   PHYSICAL EXAM:  VITAL SIGNS: ED Triage Vitals  Enc Vitals Group     BP      Pulse      Resp      Temp      Temp src      SpO2      Weight      Height      Head Circumference      Peak Flow      Pain Score      Pain Loc      Pain Edu?      Excl. in GC?    Constitutional: Alert and oriented.  Morbidly obese, no distress Eyes: Conjunctivae are normal. Normal extraocular movements. ENT   Head: Normocephalic and atraumatic.   Nose: No congestion/rhinnorhea.   Mouth/Throat: Mucous membranes are moist.   Neck: No stridor. Cardiovascular: Normal rate, regular rhythm. No murmurs, rubs, or gallops. Respiratory: Normal respiratory effort without tachypnea nor retractions. Breath sounds are clear and equal bilaterally. No wheezes/rales/rhonchi. Gastrointestinal: Soft and nontender. Normal bowel sounds Musculoskeletal: Nontender with normal range of motion in extremities.  Edema is noted Neurologic:  Normal speech and language. No gross focal neurologic deficits are appreciated.  Skin:  Skin is warm, dry and intact. No rash noted. Psychiatric: Mood and affect are normal. Speech and behavior are normal.  ____________________________________________  EKG: Interpreted by me.  Sinus rhythm rate 84 bpm, prolonged PR interval, low voltage, normal axis  ____________________________________________  ED COURSE:  As part of my  medical decision making, I reviewed the following data within the electronic MEDICAL RECORD NUMBER History obtained from family if available, nursing notes, old chart and ekg, as well as notes from prior ED visits. Patient presented for edema, we will assess with labs and imaging as indicated at this time.   Procedures ____________________________________________   LABS (pertinent positives/negatives)  Labs Reviewed   CBC WITH DIFFERENTIAL/PLATELET - Abnormal; Notable for the following components:      Result Value   Hemoglobin 11.3 (*)    HCT 34.9 (*)    RDW 15.1 (*)    Platelets 147 (*)    All other components within normal limits  COMPREHENSIVE METABOLIC PANEL - Abnormal; Notable for the following components:   Chloride 115 (*)    Creatinine, Ser 1.19 (*)    Total Protein 6.4 (*)    GFR calc non Af Amer 49 (*)    GFR calc Af Amer 56 (*)    Anion gap 4 (*)    All other components within normal limits  PROTIME-INR - Abnormal; Notable for the following components:   Prothrombin Time 17.8 (*)    All other components within normal limits  BRAIN NATRIURETIC PEPTIDE  TROPONIN I  GLUCOSE, CAPILLARY  URINALYSIS, COMPLETE (UACMP) WITH MICROSCOPIC   ___________________________________________  DIFFERENTIAL DIAGNOSIS   Anasarca, renal failure, peripheral edema, diastolic heart failure, dehydration, electrolyte abnormality  FINAL ASSESSMENT AND PLAN  Peripheral edema, chronic diastolic heart failure, weakness   Plan: The patient had presented for worsening edema. Patient's labs did not reveal any acute process.  Patient states she would prefer to stay in the hospital be diuresed in the hospital to the point where she can get up and take care of herself.  We will give her a dose of diuresis and discussed with social work.  Social work states she is not a candidate for inpatient and not a candidate for nursing home placement at this time.  I have healed out information for increased home health needs.  Family states she has been having thoughts of harming herself, I will discuss with psychiatry. Ulice Dash, MD   Note: This note was generated in part or whole with voice recognition software. Voice recognition is usually quite accurate but there are transcription errors that can and very often do occur. I apologize for any typographical errors that were not detected and corrected.      Emily Filbert, MD 03/05/18 1520    Emily Filbert, MD 03/05/18 1535

## 2018-03-05 NOTE — Discharge Instructions (Addendum)
Please call your primary care doctor tomorrow to arrange follow-up and help with home health, social work and possible home PT.  A case management consultation has been placed in the emergency department they should be contacting you at home.  Please return to the emergency department for any symptoms personally concerning to yourself.

## 2018-03-09 ENCOUNTER — Other Ambulatory Visit: Payer: Self-pay

## 2018-03-09 ENCOUNTER — Encounter: Payer: Self-pay | Admitting: Emergency Medicine

## 2018-03-09 ENCOUNTER — Emergency Department: Payer: Medicare Other

## 2018-03-09 ENCOUNTER — Emergency Department
Admission: EM | Admit: 2018-03-09 | Discharge: 2018-03-09 | Disposition: A | Discharge status: Home / Self Care | Payer: Medicare Other | Attending: Emergency Medicine | Admitting: Emergency Medicine

## 2018-03-09 DIAGNOSIS — I10 Essential (primary) hypertension: Secondary | ICD-10-CM | POA: Diagnosis not present

## 2018-03-09 DIAGNOSIS — R0602 Shortness of breath: Secondary | ICD-10-CM | POA: Insufficient documentation

## 2018-03-09 DIAGNOSIS — Z794 Long term (current) use of insulin: Secondary | ICD-10-CM | POA: Diagnosis not present

## 2018-03-09 DIAGNOSIS — E119 Type 2 diabetes mellitus without complications: Secondary | ICD-10-CM | POA: Diagnosis not present

## 2018-03-09 DIAGNOSIS — J449 Chronic obstructive pulmonary disease, unspecified: Secondary | ICD-10-CM | POA: Diagnosis not present

## 2018-03-09 DIAGNOSIS — Z87891 Personal history of nicotine dependence: Secondary | ICD-10-CM | POA: Insufficient documentation

## 2018-03-09 DIAGNOSIS — Z7901 Long term (current) use of anticoagulants: Secondary | ICD-10-CM | POA: Insufficient documentation

## 2018-03-09 DIAGNOSIS — Z7982 Long term (current) use of aspirin: Secondary | ICD-10-CM | POA: Insufficient documentation

## 2018-03-09 DIAGNOSIS — Z79899 Other long term (current) drug therapy: Secondary | ICD-10-CM | POA: Insufficient documentation

## 2018-03-09 DIAGNOSIS — R609 Edema, unspecified: Secondary | ICD-10-CM

## 2018-03-09 LAB — BASIC METABOLIC PANEL
Anion gap: 8 (ref 5–15)
BUN: 26 mg/dL — ABNORMAL HIGH (ref 6–20)
CALCIUM: 8.5 mg/dL — AB (ref 8.9–10.3)
CHLORIDE: 112 mmol/L — AB (ref 98–111)
CO2: 21 mmol/L — AB (ref 22–32)
CREATININE: 1.5 mg/dL — AB (ref 0.44–1.00)
GFR calc Af Amer: 43 mL/min — ABNORMAL LOW (ref >60)
GFR calc non Af Amer: 37 mL/min — ABNORMAL LOW (ref >60)
GLUCOSE: 96 mg/dL (ref 70–99)
Potassium: 4.1 mmol/L (ref 3.5–5.1)
Sodium: 141 mmol/L (ref 135–145)

## 2018-03-09 LAB — CBC WITH DIFFERENTIAL/PLATELET
Basophils Absolute: 0 10*3/uL (ref 0–0.1)
Basophils Relative: 1 %
EOS ABS: 0.2 10*3/uL (ref 0–0.7)
Eosinophils Relative: 4 %
HEMATOCRIT: 35.3 % (ref 35.0–47.0)
HEMOGLOBIN: 11.4 g/dL — AB (ref 12.0–16.0)
LYMPHS ABS: 1.4 10*3/uL (ref 1.0–3.6)
Lymphocytes Relative: 26 %
MCH: 28.9 pg (ref 26.0–34.0)
MCHC: 32.2 g/dL (ref 32.0–36.0)
MCV: 89.8 fL (ref 80.0–100.0)
MONO ABS: 0.7 10*3/uL (ref 0.2–0.9)
MONOS PCT: 12 %
NEUTROS PCT: 57 %
Neutro Abs: 3.1 10*3/uL (ref 1.4–6.5)
Platelets: 167 10*3/uL (ref 150–440)
RBC: 3.93 MIL/uL (ref 3.80–5.20)
RDW: 14.9 % — AB (ref 11.5–14.5)
WBC: 5.4 10*3/uL (ref 3.6–11.0)

## 2018-03-09 LAB — BRAIN NATRIURETIC PEPTIDE: B Natriuretic Peptide: 16 pg/mL (ref 0.0–100.0)

## 2018-03-09 LAB — TROPONIN I: Troponin I: <0.03 ng/mL (ref <0.03)

## 2018-03-09 MED ORDER — FUROSEMIDE 10 MG/ML IJ SOLN
80.0000 mg | Freq: Once | INTRAMUSCULAR | Status: AC
  Administered 2018-03-09: 80 mg via INTRAVENOUS
  Filled 2018-03-09: qty 8

## 2018-03-09 NOTE — ED Notes (Signed)
Pt back home via EMS, NAD noted. Pt verbalizes d/c instructins and follow up. Pt unable to sign due to singature pad malfnx

## 2018-03-09 NOTE — ED Triage Notes (Signed)
Pt states seen last week and given lasix and is requesting more lasix for her legs. Pt was out most of the day and states the fluid "came on sudden"

## 2018-03-09 NOTE — ED Provider Notes (Signed)
[facility] Emergency Department Provider Note ____________________________________________   First MD Initiated Contact with Patient 03/09/18 1906     (approximate)  I have reviewed the triage vital signs and the nursing notes.   HISTORY  Chief Complaint Foot Swelling    HPI Emily Fry is a 62 y.o. female with PMH as noted below as well as peripheral edema who presents with acutely worsened peripheral edema to her legs over the course of the day today, associate with mild shortness of breath, and similar to prior edema which she states was helped with Lasix.  The patient denies chest pain, rash, fever, or weakness.  She was in the ER several days ago with swelling and states that she was given Lasix and it helped.   Past Medical History:  Diagnosis Date  . A-fib (HCC)   . Chronic respiratory failure (HCC)   . COPD (chronic obstructive pulmonary disease) (HCC)   . Gout   . Hypertension   . Obesity   . Sleep apnea     Patient Active Problem List   Diagnosis Date Noted  . Hyponatremia 08/28/2015  . Hypokalemia 08/28/2015  . 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 TONSILLECTOMY      Prior to Admission medications   Medication Sig Start Date End Date Taking? Authorizing Provider  aspirin EC 81 MG tablet Take 81 mg by mouth daily.    [provider]  CARTIA XT 240 MG 24 hr capsule Take 240 mg by mouth at bedtime. 07/27/15   [provider]  furosemide (LASIX) 20 MG tablet Take 1 tablet (20 mg total) by mouth daily for 7 days. 03/05/18 03/12/18  Minna Antis, MD  insulin aspart (NOVOLOG) 100 UNIT/ML FlexPen Inject 18 Units into the skin 3 (three) times daily with meals. 08/28/15   Katharina Caper, MD  Insulin Glargine (LANTUS) 100 UNIT/ML Solostar Pen Inject 45 Units into the skin daily  at 10 pm. 08/28/15   Katharina Caper, MD  magnesium oxide (MAG-OX) 400 (241.3 Mg) MG tablet Take 1 tablet (400 mg total) by mouth daily. 08/28/15   Katharina Caper, MD  metoprolol tartrate (LOPRESSOR) 25 MG tablet Take 12.5 mg by mouth 2 (two) times daily. 07/25/15   [provider]  pantoprazole (PROTONIX) 40 MG tablet Take 40 mg by mouth daily. 08/18/15   [provider]  simvastatin (ZOCOR) 40 MG tablet Take 40 mg by mouth daily. 06/17/15   [provider]  warfarin (COUMADIN) 6 MG tablet Take 6 mg by mouth daily. 08/19/15   [provider]    Allergies Contrast media [iodinated diagnostic agents]  Family History  Problem Relation Age of Onset  . Cancer Mother        deceased  . Cancer Father        deceased    Social History Social History   Tobacco Use  . Smoking status: Former Games developer  . Smokeless tobacco: Never Used  Substance Use Topics  . Alcohol use: No    Alcohol/week: 0.0 standard drinks  . Drug use: No    Review of Systems  Constitutional: No fever.  Positive for chills. Eyes: No redness. ENT: No sore throat. Cardiovascular: Denies chest pain. Respiratory: Positive for mild shortness of breath. Gastrointestinal: No vomiting.  Genitourinary: Negative for dysuria.  Musculoskeletal: Negative for back pain. Skin: Negative for rash. Neurological: Negative for  headache.   ____________________________________________   PHYSICAL EXAM:  VITAL SIGNS: ED Triage Vitals  Enc Vitals Group     BP 03/09/18 1903 (!) 130/59     Pulse Rate 03/09/18 1903 89     Resp 03/09/18 1905 20     Temp 03/09/18 1905 98.2 F (36.8 C)     Temp src --      SpO2 03/09/18 1903 91 %     Weight 03/09/18 1905 (!) 367 lb 5 oz (166.6 kg)     Height 03/09/18 1905 5\' 4"  (1.626 m)     Head Circumference --      Peak Flow --      Pain Score 03/09/18 1905 0     Pain Loc --      Pain Edu? --      Excl. in GC? --     Constitutional: Alert and oriented.   Obese.  Relatively comfortable appearing and in no acute distress. Eyes: Conjunctivae are normal.  Head: Atraumatic. Nose: No congestion/rhinnorhea. Mouth/Throat: Mucous membranes are moist.   Neck: Normal range of motion.  Cardiovascular: Normal rate, regular rhythm. Grossly normal heart sounds.  Good peripheral circulation. Respiratory: Normal respiratory effort.  No retractions. Lungs CTAB. Gastrointestinal: No distention.  Musculoskeletal: 2+ bilateral lower extremity edema.  Extremities warm and well perfused.  Neurologic:  Normal speech and language. No gross focal neurologic deficits are appreciated.  Skin:  Skin is warm and dry. No rash noted. Psychiatric: Mood and affect are normal. Speech and behavior are normal.  ____________________________________________   LABS (all labs ordered are listed, but only abnormal results are displayed)  Labs Reviewed  BASIC METABOLIC PANEL - Abnormal; Notable for the following components:      Result Value   Chloride 112 (*)    CO2 21 (*)    BUN 26 (*)    Creatinine, Ser 1.50 (*)    Calcium 8.5 (*)    GFR calc non Af Amer 37 (*)    GFR calc Af Amer 43 (*)    All other components within normal limits  CBC WITH DIFFERENTIAL/PLATELET - Abnormal; Notable for the following components:   Hemoglobin 11.4 (*)    RDW 14.9 (*)    All other components within normal limits  TROPONIN I  BRAIN NATRIURETIC PEPTIDE   ____________________________________________  EKG    ____________________________________________  RADIOLOGY  CXR: No edema  ____________________________________________   PROCEDURES  Procedure(s) performed: No  Procedures  Critical Care performed: No ____________________________________________   INITIAL IMPRESSION / ASSESSMENT AND PLAN / ED COURSE  Pertinent labs & imaging results that were available during my care of the patient were reviewed by me and considered in my medical decision making (see chart for  details).  61 year old female with PMH as noted above presents with acute on chronic lower extremity edema which she states worsened today.  It is associated with mild shortness of breath but this is not her primary complaint.  I reviewed the past medical records in Epic; the patient was most recently seen in the ED on 03/05/2018.  Per the notes the patient had requested admission to the hospital but did not meet criteria for inpatient admission.  She was evaluated by social work for possible rehab or placement, although she ended up being discharged home.  On exam, the patient is relatively comfortable appearing.  Her vital signs are normal.  She has chronic appearing edema to bilateral lower extremities.  The remainder of the exam is unremarkable.  Overall presentation is consistent with the patient's ongoing peripheral edema.  We will give a dose of IV Lasix and obtain labs and x-ray to rule out acute CHF or other concerning precipitating cause.  ----------------------------------------- 8:37 PM on 03/09/2018 -----------------------------------------  The lab workup is unremarkable except for minimally elevated creatinine.  The patient is starting to get relief with the lasix.  I will allow her to wait in the ED for another 1-2 hours since she is urinating frequently and has poor mobility, but after that she will be safe for discharge home.  The patient and her family member agree with the plan.  I instructed her to follow up with her regular doctor.  Return precautions given and she expressed understanding.   ____________________________________________   FINAL CLINICAL IMPRESSION(S) / ED DIAGNOSES  Final diagnoses:  Peripheral edema      NEW MEDICATIONS STARTED DURING THIS VISIT:  New Prescriptions   No medications on file     Note:  This document was prepared using Dragon voice recognition software and may include unintentional dictation errors.    Dionne Bucy,  MD 03/09/18 2038

## 2018-03-09 NOTE — ED Notes (Signed)
Iv d/c'd. Pt states needs an ambulance ride back.

## 2018-03-09 NOTE — ED Notes (Signed)
Per doctor pt will be kept for a few hours after the lasix and then discharged. Pt keeps taking off her bp cuff and monitor. Dr aware.

## 2018-03-09 NOTE — Discharge Instructions (Signed)
Continue to take your lasix at home.  Return to the ER for new, worsening or persistent swelling, rash, shortness of breath or any other new or worsening symptoms that concern you.  Follow up with your regular doctor.

## 2018-03-09 NOTE — ED Notes (Signed)
Called ems for transport to home 1108pm

## 2018-04-14 ENCOUNTER — Encounter: Payer: Self-pay | Admitting: *Deleted

## 2018-04-14 ENCOUNTER — Emergency Department: Payer: Medicare Other

## 2018-04-14 ENCOUNTER — Observation Stay
Admission: EM | Admit: 2018-04-14 | Discharge: 2018-04-16 | Disposition: A | Discharge status: Skilled Nursing Facility | Payer: Medicare Other | Attending: Internal Medicine | Admitting: Internal Medicine

## 2018-04-14 ENCOUNTER — Other Ambulatory Visit: Payer: Self-pay

## 2018-04-14 DIAGNOSIS — I110 Hypertensive heart disease with heart failure: Secondary | ICD-10-CM | POA: Diagnosis not present

## 2018-04-14 DIAGNOSIS — Z7982 Long term (current) use of aspirin: Secondary | ICD-10-CM | POA: Diagnosis not present

## 2018-04-14 DIAGNOSIS — Z9981 Dependence on supplemental oxygen: Secondary | ICD-10-CM | POA: Insufficient documentation

## 2018-04-14 DIAGNOSIS — J9621 Acute and chronic respiratory failure with hypoxia: Secondary | ICD-10-CM | POA: Diagnosis not present

## 2018-04-14 DIAGNOSIS — R0902 Hypoxemia: Secondary | ICD-10-CM | POA: Diagnosis present

## 2018-04-14 DIAGNOSIS — Z23 Encounter for immunization: Secondary | ICD-10-CM | POA: Insufficient documentation

## 2018-04-14 DIAGNOSIS — Z6841 Body Mass Index (BMI) 40.0 and over, adult: Secondary | ICD-10-CM | POA: Diagnosis not present

## 2018-04-14 DIAGNOSIS — I4891 Unspecified atrial fibrillation: Secondary | ICD-10-CM | POA: Diagnosis not present

## 2018-04-14 DIAGNOSIS — Z7901 Long term (current) use of anticoagulants: Secondary | ICD-10-CM | POA: Diagnosis not present

## 2018-04-14 DIAGNOSIS — Z87891 Personal history of nicotine dependence: Secondary | ICD-10-CM | POA: Diagnosis not present

## 2018-04-14 DIAGNOSIS — Z79899 Other long term (current) drug therapy: Secondary | ICD-10-CM | POA: Diagnosis not present

## 2018-04-14 DIAGNOSIS — J449 Chronic obstructive pulmonary disease, unspecified: Secondary | ICD-10-CM | POA: Insufficient documentation

## 2018-04-14 DIAGNOSIS — Z794 Long term (current) use of insulin: Secondary | ICD-10-CM | POA: Insufficient documentation

## 2018-04-14 DIAGNOSIS — E119 Type 2 diabetes mellitus without complications: Secondary | ICD-10-CM | POA: Insufficient documentation

## 2018-04-14 DIAGNOSIS — M109 Gout, unspecified: Secondary | ICD-10-CM | POA: Insufficient documentation

## 2018-04-14 DIAGNOSIS — G4730 Sleep apnea, unspecified: Secondary | ICD-10-CM | POA: Diagnosis not present

## 2018-04-14 DIAGNOSIS — I5032 Chronic diastolic (congestive) heart failure: Secondary | ICD-10-CM | POA: Insufficient documentation

## 2018-04-14 DIAGNOSIS — Z880 Allergy status to penicillin: Secondary | ICD-10-CM | POA: Diagnosis not present

## 2018-04-14 LAB — CBC WITH DIFFERENTIAL/PLATELET
Abs Immature Granulocytes: 0.02 10*3/uL (ref 0.00–0.07)
Basophils Absolute: 0 10*3/uL (ref 0.0–0.1)
Basophils Relative: 0 %
EOS PCT: 5 %
Eosinophils Absolute: 0.2 10*3/uL (ref 0.0–0.5)
HCT: 39.8 % (ref 36.0–46.0)
Hemoglobin: 11.4 g/dL — ABNORMAL LOW (ref 12.0–15.0)
Immature Granulocytes: 0 %
LYMPHS PCT: 25 %
Lymphs Abs: 1.2 10*3/uL (ref 0.7–4.0)
MCH: 28.6 pg (ref 26.0–34.0)
MCHC: 28.6 g/dL — AB (ref 30.0–36.0)
MCV: 100 fL (ref 80.0–100.0)
MONO ABS: 0.6 10*3/uL (ref 0.1–1.0)
Monocytes Relative: 11 %
Neutro Abs: 2.9 10*3/uL (ref 1.7–7.7)
Neutrophils Relative %: 59 %
Platelets: 152 10*3/uL (ref 150–400)
RBC: 3.98 MIL/uL (ref 3.87–5.11)
RDW: 14 % (ref 11.5–15.5)
WBC: 5 10*3/uL (ref 4.0–10.5)
nRBC: 0 % (ref 0.0–0.2)

## 2018-04-14 LAB — COMPREHENSIVE METABOLIC PANEL
ALBUMIN: 3.4 g/dL — AB (ref 3.5–5.0)
ALK PHOS: 93 U/L (ref 38–126)
ALT: 25 U/L (ref 0–44)
AST: 26 U/L (ref 15–41)
Anion gap: 5 (ref 5–15)
BUN: 20 mg/dL (ref 6–20)
CALCIUM: 8.7 mg/dL — AB (ref 8.9–10.3)
CO2: 28 mmol/L (ref 22–32)
Chloride: 111 mmol/L (ref 98–111)
Creatinine, Ser: 1.12 mg/dL — ABNORMAL HIGH (ref 0.44–1.00)
GFR calc Af Amer: >60 mL/min (ref >60)
GFR calc non Af Amer: 52 mL/min — ABNORMAL LOW (ref >60)
Glucose, Bld: 108 mg/dL — ABNORMAL HIGH (ref 70–99)
Potassium: 4.2 mmol/L (ref 3.5–5.1)
SODIUM: 144 mmol/L (ref 135–145)
Total Bilirubin: 0.5 mg/dL (ref 0.3–1.2)
Total Protein: 6.6 g/dL (ref 6.5–8.1)

## 2018-04-14 LAB — GLUCOSE, CAPILLARY: Glucose-Capillary: 108 mg/dL — ABNORMAL HIGH (ref 70–99)

## 2018-04-14 LAB — PROTIME-INR
INR: 1.11
Prothrombin Time: 14.2 seconds (ref 11.4–15.2)

## 2018-04-14 LAB — BRAIN NATRIURETIC PEPTIDE: B Natriuretic Peptide: 53 pg/mL (ref 0.0–100.0)

## 2018-04-14 LAB — TROPONIN I: Troponin I: <0.03 ng/mL (ref <0.03)

## 2018-04-14 LAB — APTT: APTT: 36 s (ref 24–36)

## 2018-04-14 MED ORDER — FEBUXOSTAT 40 MG PO TABS
80.0000 mg | ORAL_TABLET | Freq: Every day | ORAL | Status: DC
  Administered 2018-04-15 – 2018-04-16 (×2): 80 mg via ORAL
  Filled 2018-04-14 (×2): qty 2

## 2018-04-14 MED ORDER — ONDANSETRON HCL 4 MG/2ML IJ SOLN: 4.0000 mg | Freq: Four times a day (QID) | INTRAMUSCULAR | Status: DC | PRN

## 2018-04-14 MED ORDER — SPIRONOLACTONE 25 MG PO TABS
25.0000 mg | ORAL_TABLET | Freq: Every day | ORAL | Status: DC
  Filled 2018-04-14: qty 1

## 2018-04-14 MED ORDER — GABAPENTIN 100 MG PO CAPS
100.0000 mg | ORAL_CAPSULE | Freq: Two times a day (BID) | ORAL | Status: DC
  Administered 2018-04-14 – 2018-04-16 (×4): 100 mg via ORAL
  Filled 2018-04-14 (×4): qty 1

## 2018-04-14 MED ORDER — FUROSEMIDE 40 MG PO TABS
80.0000 mg | ORAL_TABLET | Freq: Every day | ORAL | Status: DC
  Administered 2018-04-14: 80 mg via ORAL
  Filled 2018-04-14: qty 2

## 2018-04-14 MED ORDER — ASPIRIN EC 81 MG PO TBEC
81.0000 mg | DELAYED_RELEASE_TABLET | Freq: Every day | ORAL | Status: DC
  Administered 2018-04-15 – 2018-04-16 (×2): 81 mg via ORAL
  Filled 2018-04-14 (×2): qty 1

## 2018-04-14 MED ORDER — MOMETASONE FURO-FORMOTEROL FUM 200-5 MCG/ACT IN AERO
2.0000 | INHALATION_SPRAY | Freq: Two times a day (BID) | RESPIRATORY_TRACT | Status: DC
  Administered 2018-04-15 – 2018-04-16 (×4): 2 via RESPIRATORY_TRACT
  Filled 2018-04-14: qty 8.8

## 2018-04-14 MED ORDER — PANTOPRAZOLE SODIUM 40 MG PO TBEC
40.0000 mg | DELAYED_RELEASE_TABLET | Freq: Every day | ORAL | Status: DC
  Administered 2018-04-16: 40 mg via ORAL
  Filled 2018-04-14 (×2): qty 1

## 2018-04-14 MED ORDER — MAGNESIUM OXIDE 400 (241.3 MG) MG PO TABS
400.0000 mg | ORAL_TABLET | Freq: Every day | ORAL | Status: DC
  Administered 2018-04-16: 400 mg via ORAL
  Filled 2018-04-14: qty 1

## 2018-04-14 MED ORDER — DILTIAZEM HCL ER COATED BEADS 240 MG PO CP24
240.0000 mg | ORAL_CAPSULE | Freq: Every day | ORAL | Status: DC
  Administered 2018-04-14 – 2018-04-15 (×2): 240 mg via ORAL
  Filled 2018-04-14 (×2): qty 1

## 2018-04-14 MED ORDER — INSULIN GLARGINE 100 UNIT/ML ~~LOC~~ SOLN
10.0000 [IU] | Freq: Every day | SUBCUTANEOUS | Status: DC
  Administered 2018-04-15: 10 [IU] via SUBCUTANEOUS
  Filled 2018-04-14 (×2): qty 0.1

## 2018-04-14 MED ORDER — ACETAMINOPHEN 650 MG RE SUPP: 650.0000 mg | Freq: Four times a day (QID) | RECTAL | Status: DC | PRN

## 2018-04-14 MED ORDER — IPRATROPIUM-ALBUTEROL 0.5-2.5 (3) MG/3ML IN SOLN
3.0000 mL | Freq: Once | RESPIRATORY_TRACT | Status: AC
  Administered 2018-04-14: 3 mL via RESPIRATORY_TRACT
  Filled 2018-04-14: qty 3

## 2018-04-14 MED ORDER — INSULIN ASPART 100 UNIT/ML ~~LOC~~ SOLN: 0.0000 [IU] | Freq: Three times a day (TID) | SUBCUTANEOUS | Status: DC

## 2018-04-14 MED ORDER — ACETAZOLAMIDE 250 MG PO TABS
250.0000 mg | ORAL_TABLET | Freq: Two times a day (BID) | ORAL | Status: DC
  Administered 2018-04-15 – 2018-04-16 (×3): 250 mg via ORAL
  Filled 2018-04-14 (×5): qty 1

## 2018-04-14 MED ORDER — WARFARIN SODIUM 6 MG PO TABS: 6.0000 mg | ORAL_TABLET | Freq: Every day | ORAL | Status: DC

## 2018-04-14 MED ORDER — INSULIN GLARGINE 100 UNIT/ML ~~LOC~~ SOLN: 14.0000 [IU] | Freq: Every day | SUBCUTANEOUS | Status: DC

## 2018-04-14 MED ORDER — METOPROLOL TARTRATE 25 MG PO TABS
12.5000 mg | ORAL_TABLET | Freq: Two times a day (BID) | ORAL | Status: DC
  Administered 2018-04-14 – 2018-04-15 (×3): 12.5 mg via ORAL
  Filled 2018-04-14 (×3): qty 1

## 2018-04-14 MED ORDER — ONDANSETRON HCL 4 MG PO TABS: 4.0000 mg | ORAL_TABLET | Freq: Four times a day (QID) | ORAL | Status: DC | PRN

## 2018-04-14 MED ORDER — FUROSEMIDE 10 MG/ML IJ SOLN
20.0000 mg | Freq: Once | INTRAMUSCULAR | Status: AC
  Administered 2018-04-14: 20 mg via INTRAVENOUS
  Filled 2018-04-14: qty 4

## 2018-04-14 MED ORDER — COLCHICINE 0.6 MG PO TABS
0.6000 mg | ORAL_TABLET | Freq: Two times a day (BID) | ORAL | Status: DC
  Administered 2018-04-14 – 2018-04-16 (×4): 0.6 mg via ORAL
  Filled 2018-04-14 (×4): qty 1

## 2018-04-14 MED ORDER — ACETAMINOPHEN 325 MG PO TABS
650.0000 mg | ORAL_TABLET | Freq: Four times a day (QID) | ORAL | Status: DC | PRN
  Administered 2018-04-15 (×2): 650 mg via ORAL
  Filled 2018-04-14 (×2): qty 2

## 2018-04-14 MED ORDER — INSULIN ASPART 100 UNIT/ML FLEXPEN: 8.0000 [IU] | PEN_INJECTOR | Freq: Three times a day (TID) | SUBCUTANEOUS | Status: DC

## 2018-04-14 MED ORDER — INSULIN ASPART 100 UNIT/ML ~~LOC~~ SOLN: 0.0000 [IU] | Freq: Every day | SUBCUTANEOUS | Status: DC

## 2018-04-14 MED ORDER — SIMVASTATIN 20 MG PO TABS
40.0000 mg | ORAL_TABLET | Freq: Every day | ORAL | Status: DC
  Administered 2018-04-15: 40 mg via ORAL
  Filled 2018-04-14: qty 2

## 2018-04-14 MED ORDER — FUROSEMIDE 40 MG PO TABS: 40.0000 mg | ORAL_TABLET | Freq: Every day | ORAL | Status: DC

## 2018-04-14 MED ORDER — POTASSIUM CHLORIDE CRYS ER 20 MEQ PO TBCR
40.0000 meq | EXTENDED_RELEASE_TABLET | Freq: Two times a day (BID) | ORAL | Status: DC
  Administered 2018-04-14 – 2018-04-15 (×3): 40 meq via ORAL
  Filled 2018-04-14 (×3): qty 2

## 2018-04-14 NOTE — ED Notes (Signed)
Report off to kala rn 

## 2018-04-14 NOTE — ED Notes (Signed)
LAB CALLED NEED NEW  GREEN TOP TUBE  AND PURPLE TOP  INFORMED  AMY  COYNE  RN

## 2018-04-14 NOTE — ED Notes (Signed)
Pt alert  Family with pt 

## 2018-04-14 NOTE — ED Provider Notes (Signed)
[facility] Emergency Department Provider Note   ____________________________________________   First MD Initiated Contact with Patient 04/14/18 1545     (approximate)  I have reviewed the triage vital signs and the nursing notes.   HISTORY  Chief Complaint Shortness of Breath    HPI Emily Fry is a 62 y.o. female reports she is been feeling increasingly short of breath since Sunday.  That was yesterday.  She has a dry cough she says she is swollen and she cannot fit in her shoes anymore.  She says [facility] says she retains fluid but does not have CHF.  She is usually on 2 to 3 L of oxygen.  She has an oxygen concentrator at home and takes her short tube and connected to her long tubes if she can get around more.  Past Medical History:  Diagnosis Date  . A-fib (HCC)   . Chronic respiratory failure (HCC)   . COPD (chronic obstructive pulmonary disease) (HCC)   . Gout   . Hypertension   . Obesity   . Sleep apnea   Also diabetes  Patient Active Problem List   Diagnosis Date Noted  . Hyponatremia 08/28/2015  . Hypokalemia 08/28/2015  . 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 TONSILLECTOMY      Prior to Admission medications   Medication Sig Start Date End Date Taking? Authorizing Provider  aspirin EC 81 MG tablet Take 81 mg by mouth daily.    [provider]  CARTIA XT 240 MG 24 hr capsule Take 240 mg by mouth at bedtime. 07/27/15   [provider]  furosemide (LASIX) 20 MG tablet Take 1 tablet (20 mg total) by mouth daily for 7 days. 03/05/18 03/12/18  Minna Antis, MD  insulin aspart (NOVOLOG) 100 UNIT/ML FlexPen Inject 18 Units into the skin 3 (three) times daily with meals. 08/28/15   Katharina Caper, MD  Insulin Glargine (LANTUS) 100 UNIT/ML Solostar Pen Inject 45 Units into the skin  daily at 10 pm. 08/28/15   Katharina Caper, MD  magnesium oxide (MAG-OX) 400 (241.3 Mg) MG tablet Take 1 tablet (400 mg total) by mouth daily. 08/28/15   Katharina Caper, MD  metoprolol tartrate (LOPRESSOR) 25 MG tablet Take 12.5 mg by mouth 2 (two) times daily. 07/25/15   [provider]  pantoprazole (PROTONIX) 40 MG tablet Take 40 mg by mouth daily. 08/18/15   [provider]  simvastatin (ZOCOR) 40 MG tablet Take 40 mg by mouth daily. 06/17/15   [provider]  warfarin (COUMADIN) 6 MG tablet Take 6 mg by mouth daily. 08/19/15   [provider]    Allergies Contrast media [iodinated diagnostic agents]  Family History  Problem Relation Age of Onset  . Cancer Mother        deceased  . Cancer Father        deceased    Social History Social History   Tobacco Use  . Smoking status: Former Games developer  . Smokeless tobacco: Never Used  Substance Use Topics  . Alcohol use: No    Alcohol/week: 0.0 standard drinks  . Drug use: No    Review of Systems  Constitutional: No fever/chills Eyes: No visual changes. ENT: No sore throat. Cardiovascular: Denies chest pain. Respiratory: Denies shortness of breath. Gastrointestinal: No abdominal pain.  No nausea, no vomiting.  No diarrhea.  No constipation. Genitourinary:  Negative for dysuria. Musculoskeletal: Negative for back pain. Skin: Negative for rash. Neurological: Negative for headaches, focal weakness  ____________________________________________   PHYSICAL EXAM:  VITAL SIGNS: ED Triage Vitals  Enc Vitals Group     BP      Pulse      Resp      Temp      Temp src      SpO2      Weight      Height      Head Circumference      Peak Flow      Pain Score      Pain Loc      Pain Edu?      Excl. in GC?     Constitutional: Alert and oriented. Well appearing and in no acute distress. Eyes: Conjunctivae are normal.  Head: Atraumatic. Nose: No congestion/rhinnorhea. Mouth/Throat: Mucous  membranes are moist.  Oropharynx non-erythematous. Neck: No stridor. Cardiovascular: Normal rate, regular rhythm. Grossly normal heart sounds.  Good peripheral circulation. Respiratory: Normal respiratory effort.  No retractions. Lungs CTAB. Gastrointestinal: Soft and nontender. No distention. No abdominal bruits. No CVA tenderness. Musculoskeletal: No lower extremity tenderness trace edema. Neurologic:  Normal speech and language. No gross focal neurologic deficits are appreciated.  Skin:  Skin is warm, dry and intact. No rash noted. Psychiatric: Mood and affect are normal. Speech and behavior are normal.  ____________________________________________   LABS (all labs ordered are listed, but only abnormal results are displayed)  Labs Reviewed  CBC WITH DIFFERENTIAL/PLATELET - Abnormal; Notable for the following components:      Result Value   Hemoglobin 11.4 (*)    MCHC 28.6 (*)    All other components within normal limits  COMPREHENSIVE METABOLIC PANEL - Abnormal; Notable for the following components:   Glucose, Bld 108 (*)    Creatinine, Ser 1.12 (*)    Calcium 8.7 (*)    Albumin 3.4 (*)    GFR calc non Af Amer 52 (*)    All other components within normal limits  BRAIN NATRIURETIC PEPTIDE  TROPONIN I  URINALYSIS, COMPLETE (UACMP) WITH MICROSCOPIC   ____________________________________________  EKG  EKG read interpreted by me shows normal sinus rhythm rate of 90 normal axis decreased amplitude in the chest leads computer is reading minimal ST elevation inferiorly I do not see this. ____________________________________________  RADIOLOGY  ED MD interpretation: Chest x-ray shows some atelectasis only per radiology.  I reviewed the film.  Official radiology report(s): Dg Chest Portable 1 View  Result Date: 04/14/2018 CLINICAL DATA:  Short of breath EXAM: PORTABLE CHEST 1 VIEW COMPARISON:  03/09/2018 FINDINGS: Cardiac enlargement without heart failure. Mild atelectasis in  the lung bases with decreased lung volume compared to the prior study. Negative for pneumonia or effusion IMPRESSION: Hypoventilation with mild bibasilar atelectasis Electronically Signed   By: Marlan Palau M.D.   On: 04/14/2018 16:19    ____________________________________________   PROCEDURES  Procedure(s) performed:   Procedures  Critical Care performed:   ____________________________________________   INITIAL IMPRESSION / ASSESSMENT AND PLAN / ED COURSE  Patient studies are all essentially normal.  She also looks fairly normal.  There is just slight trace of edema.  Her BNP is negative.  I will try and see if her family can bring her oxygen concentrator over here so we can make sure it is working.  Clinical Course as of Apr 14 2036  Mon Apr 14, 2018  1807 Potassium: 4.2 [PM]    Clinical Course  User Index [PM] Arnaldo Natal, MD  Brought the patient's oxygen concentrator in.  It has a red light blinking and it is not producing oxygen.  Her oxygen sats fall down to 88 while she is wearing her oxygen concentrator in turn and is turned on and according to the lights on the front putting out 5 L of O2 which is the most he can put out.  I am currently on the phone with indigent and they cannot help me to make this thing work.  We will plan on putting her in the hospital until we can get this straightened out.   ____________________________________________   FINAL CLINICAL IMPRESSION(S) / ED DIAGNOSES  Final diagnoses:  Hypoxia     ED Discharge Orders    None       Note:  This document was prepared using Dragon voice recognition software and may include unintentional dictation errors.    Arnaldo Natal, MD 04/14/18 2038

## 2018-04-14 NOTE — ED Notes (Signed)
meds given. Pt alert 

## 2018-04-14 NOTE — ED Notes (Signed)
Pt brought in via ems from home with sob.  Pt states PT was at her home this morning and thought pt was more sob than usual and called for ems.  Pt on 3 liters oxygen.  Hx copd.  Pt denies chest pain.  md at bedside.  Iv started and labs sent.  Pt alert  Skin warm and dry  nsr on monitor.

## 2018-04-14 NOTE — ED Triage Notes (Signed)
Pt brought in via ems from home with sob.  No chest pain.  Pt reports she has fluid retention.  Pt is on 3 liters oxygen at home

## 2018-04-14 NOTE — H&P (Signed)
[facility] - [place] at [facility]   PATIENT NAME: Emily Fry    MR#:  [id number]  DATE OF BIRTH:  10/01/1955  DATE OF ADMISSION:  04/14/2018  PRIMARY CARE PHYSICIAN: Lauro Regulus, MD   REQUESTING/REFERRING PHYSICIAN: Dr. Dorothea Glassman  CHIEF COMPLAINT:   Chief Complaint  Patient presents with  . Shortness of Breath    HISTORY OF PRESENT ILLNESS:  Emily Fry  is a 62 y.o. female with a known history of chronic respiratory failure on 2 L home oxygen, COPD, morbid obesity, sleep apnea, hypertension and atrial fibrillation on Coumadin at home presents to hospital secondary to worsening shortness of breath. Patient states at home by herself and her cousin helps her.  She used to ambulate with a walker to the bathroom but not recently.  She has a home health physical therapist who came to check on her today and she complained of worsening shortness of breath they checked her sats and they were noted to be low and so sent to the emergency room.  Also she is noted to have oxygen concentrator at home not functioning today.  Chest x-ray showing bibasilar atelectasis and hypoventilation.  Her sats improved with the dose of IV Lasix and placing her on 2 L oxygen.  And home oxygen concentrator could not be arranged tonight and so she is getting admitted for the same under observation.  PAST MEDICAL HISTORY:   Past Medical History:  Diagnosis Date  . A-fib (HCC)   . Chronic respiratory failure (HCC)    on 2L home o2  . COPD (chronic obstructive pulmonary disease) (HCC)   . Gout   . Hypertension   . Obesity   . Sleep apnea     PAST SURGICAL HISTORY:   Past Surgical History:  Procedure Laterality Date  . TONSILLECTOMY      SOCIAL HISTORY:   Social History   Tobacco Use  . Smoking status: Former Games developer  . Smokeless tobacco: Never Used  . Tobacco comment: stopped 9 years ago  Substance Use Topics  . Alcohol use: No    Alcohol/week: 0.0 standard  drinks    FAMILY HISTORY:   Family History  Problem Relation Age of Onset  . Cancer Mother        deceased  . Throat cancer Mother   . Cancer Father        deceased  . Lung cancer Father     DRUG ALLERGIES:   Allergies  Allergen Reactions  . Contrast Media [Iodinated Diagnostic Agents] Swelling    REVIEW OF SYSTEMS:   Review of Systems  Constitutional: Positive for malaise/fatigue. Negative for chills, fever and weight loss.  HENT: Negative for ear discharge, ear pain, hearing loss, nosebleeds and tinnitus.   Eyes: Negative for blurred vision, double vision and photophobia.  Respiratory: Positive for shortness of breath. Negative for cough, hemoptysis and wheezing.   Cardiovascular: Positive for leg swelling. Negative for chest pain, palpitations and orthopnea.  Gastrointestinal: Negative for abdominal pain, constipation, diarrhea, heartburn, melena, nausea and vomiting.  Genitourinary: Negative for dysuria, frequency, hematuria and urgency.  Musculoskeletal: Positive for myalgias. Negative for back pain and neck pain.  Skin: Negative for rash.  Neurological: Negative for dizziness, tingling, tremors, sensory change, speech change, focal weakness and headaches.  Endo/Heme/Allergies: Does not bruise/bleed easily.  Psychiatric/Behavioral: Negative for depression.    MEDICATIONS AT HOME:   Prior to Admission medications   Medication Sig Start Date End Date Taking? Authorizing Provider  acetaZOLAMIDE (DIAMOX) 250 MG tablet Take 250 mg by mouth 2 (two) times daily. 02/07/18  Yes [provider]  CARTIA XT 240 MG 24 hr capsule Take 240 mg by mouth at bedtime. 07/27/15  Yes [provider]  colchicine 0.6 MG tablet Take 0.6 mg by mouth 2 (two) times daily. 04/04/18  Yes [provider]  Febuxostat 80 MG TABS Take 80 mg by mouth daily. 03/15/18  Yes [provider]  furosemide (LASIX) 80 MG tablet Take 80 mg by mouth at bedtime. 03/24/18  Yes  [provider]  gabapentin (NEURONTIN) 100 MG capsule Take 100 mg by mouth 2 (two) times daily. 03/25/18  Yes [provider]  Insulin Glargine (LANTUS) 100 UNIT/ML Solostar Pen Inject 45 Units into the skin daily at 10 pm. Patient taking differently: Inject 14 Units into the skin daily at 10 pm.  08/28/15  Yes Katharina Caper, MD  metoprolol tartrate (LOPRESSOR) 25 MG tablet Take 12.5 mg by mouth 2 (two) times daily. 07/25/15  Yes [provider]  pantoprazole (PROTONIX) 40 MG tablet Take 40 mg by mouth daily. 08/18/15  Yes [provider]  potassium chloride SA (K-DUR,KLOR-CON) 20 MEQ tablet Take 40 mEq by mouth 2 (two) times daily. 03/17/18  Yes [provider]  simvastatin (ZOCOR) 40 MG tablet Take 40 mg by mouth daily. 06/17/15  Yes [provider]  spironolactone (ALDACTONE) 25 MG tablet Take 25 mg by mouth daily. 04/10/18  Yes [provider]  TRULICITY 1.5 MG/0.5ML SOPN Inject 1.5 mg into the skin once a week. 03/23/18  Yes [provider]  aspirin EC 81 MG tablet Take 81 mg by mouth daily.    [provider]  insulin aspart (NOVOLOG) 100 UNIT/ML FlexPen Inject 18 Units into the skin 3 (three) times daily with meals. 08/28/15   Katharina Caper, MD  magnesium oxide (MAG-OX) 400 (241.3 Mg) MG tablet Take 1 tablet (400 mg total) by mouth daily. 08/28/15   Katharina Caper, MD  warfarin (COUMADIN) 5 MG tablet Take 5 mg by mouth daily.  08/19/15   [provider]      VITAL SIGNS:  Blood pressure 111/66, pulse 89, temperature 98.2 F (36.8 C), temperature source Oral, resp. rate (!) 24, height 5\' 4"  (1.626 m), weight (!) 181.4 kg, SpO2 95 %.  PHYSICAL EXAMINATION:   Physical Exam  GENERAL:  62 y.o.-year-old morbidly  Obese patient lying in the bed with no acute distress.  EYES: Pupils equal, round, reactive to light and accommodation. No scleral icterus. Extraocular muscles intact.  HEENT: Head atraumatic,  normocephalic. Oropharynx and nasopharynx clear.  NECK:  Supple, no jugular venous distention. No thyroid enlargement, no tenderness.  LUNGS: Normal breath sounds bilaterally, no wheezing, rales,rhonchi or crepitation. No use of accessory muscles of respiration. Significantly decreased bibasilar breath sounds CARDIOVASCULAR: S1, S2 normal. No murmurs, rubs, or gallops.  ABDOMEN: Soft, obese abdomen, nontender, nondistended. Bowel sounds present. No organomegaly or mass.  EXTREMITIES: No  cyanosis, or clubbing. 2+ bilateral pedal edema NEUROLOGIC: Cranial nerves II through XII are intact. Muscle strength 3/5 in all extremities- worse in lower extremities. Sensation intact. Gait not checked.  PSYCHIATRIC: The patient is alert and oriented x 3.  SKIN: No obvious rash, lesion, or ulcer.   LABORATORY PANEL:   CBC Recent Labs  Lab 04/14/18 1550  WBC 5.0  HGB 11.4*  HCT 39.8  PLT 152   ------------------------------------------------------------------------------------------------------------------  Chemistries  Recent Labs  Lab 04/14/18 1628  NA 144  K 4.2  CL 111  CO2 28  GLUCOSE 108*  BUN 20  CREATININE 1.12*  CALCIUM 8.7*  AST 26  ALT 25  ALKPHOS 93  BILITOT 0.5   ------------------------------------------------------------------------------------------------------------------  Cardiac Enzymes Recent Labs  Lab 04/14/18 1628  TROPONINI <0.03   ------------------------------------------------------------------------------------------------------------------  RADIOLOGY:  Dg Chest Portable 1 View  Result Date: 04/14/2018 CLINICAL DATA:  Short of breath EXAM: PORTABLE CHEST 1 VIEW COMPARISON:  03/09/2018 FINDINGS: Cardiac enlargement without heart failure. Mild atelectasis in the lung bases with decreased lung volume compared to the prior study. Negative for pneumonia or effusion IMPRESSION: Hypoventilation with mild bibasilar atelectasis Electronically Signed   By:  Marlan Palau M.D.   On: 04/14/2018 16:19    EKG:   Orders placed or performed during the hospital encounter of 04/14/18  . EKG 12-Lead  . EKG 12-Lead  . ED EKG  . ED EKG    IMPRESSION AND PLAN:   Emily Fry  is a 62 y.o. female with a known history of chronic respiratory failure on 2 L home oxygen, COPD, morbid obesity, sleep apnea, hypertension and atrial fibrillation on Coumadin at home presents to hospital secondary to worsening shortness of breath.  1.  Hypoxia-secondary to malfunctioning oxygen concentrator at home. -Sats improved on 2 L oxygen here which is her chronic home O2 -Chest x-ray showing hypoventilation.  Patient likely has obesity hypoventilation syndrome -Care manager consult for setting up home oxygen  2.  Chronic respiratory failure secondary to COPD-stable, no active wheezing.  Continue home inhalers and PRN nebulizers. -Continue CPAP at bedtime for sleep apnea  3.  Chronic diastolic heart failure-stable.  Continue oral Lasix and potassium supplements and Aldactone  4.  Atrial fibrillation-rate controlled.  On Cardizem and on warfarin for anticoagulation  5.  DVT prophylaxis-already on warfarin.  Pharmacy to adjust the dose.  Physical therapy consulted   All the records are reviewed and case discussed with ED provider. Management plans discussed with the patient, family and they are in agreement.  CODE STATUS: Full Code  TOTAL TIME TAKING CARE OF THIS PATIENT: 50 minutes.    Emily Fry M.D on 04/14/2018 at 9:34 PM  Between 7am to 6pm - Pager - [phone]  After 6pm go to [email or web address] - Social research officer, government  [facility]  Office  [phone]  CC: Primary care physician; Lauro Regulus, MD

## 2018-04-15 ENCOUNTER — Other Ambulatory Visit: Payer: Self-pay

## 2018-04-15 DIAGNOSIS — J9621 Acute and chronic respiratory failure with hypoxia: Secondary | ICD-10-CM | POA: Diagnosis not present

## 2018-04-15 LAB — GLUCOSE, CAPILLARY
GLUCOSE-CAPILLARY: 90 mg/dL (ref 70–99)
GLUCOSE-CAPILLARY: 89 mg/dL (ref 70–99)
GLUCOSE-CAPILLARY: 122 mg/dL — AB (ref 70–99)
Glucose-Capillary: 85 mg/dL (ref 70–99)
Glucose-Capillary: 99 mg/dL (ref 70–99)

## 2018-04-15 LAB — CBC
HCT: 37 % (ref 36.0–46.0)
Hemoglobin: 10.6 g/dL — ABNORMAL LOW (ref 12.0–15.0)
MCH: 27.9 pg (ref 26.0–34.0)
MCHC: 28.6 g/dL — ABNORMAL LOW (ref 30.0–36.0)
MCV: 97.4 fL (ref 80.0–100.0)
NRBC: 0 % (ref 0.0–0.2)
PLATELETS: 134 10*3/uL — AB (ref 150–400)
RBC: 3.8 MIL/uL — AB (ref 3.87–5.11)
RDW: 13.8 % (ref 11.5–15.5)
WBC: 5.2 10*3/uL (ref 4.0–10.5)

## 2018-04-15 LAB — BASIC METABOLIC PANEL
ANION GAP: 3 — AB (ref 5–15)
BUN: 17 mg/dL (ref 6–20)
CALCIUM: 8.5 mg/dL — AB (ref 8.9–10.3)
CO2: 30 mmol/L (ref 22–32)
Chloride: 110 mmol/L (ref 98–111)
Creatinine, Ser: 1.21 mg/dL — ABNORMAL HIGH (ref 0.44–1.00)
GFR calc Af Amer: 55 mL/min — ABNORMAL LOW (ref >60)
GFR, EST NON AFRICAN AMERICAN: 48 mL/min — AB (ref >60)
GLUCOSE: 98 mg/dL (ref 70–99)
Potassium: 4 mmol/L (ref 3.5–5.1)
Sodium: 143 mmol/L (ref 135–145)

## 2018-04-15 LAB — PROTIME-INR
INR: 1.21
PROTHROMBIN TIME: 15.2 s (ref 11.4–15.2)

## 2018-04-15 MED ORDER — FUROSEMIDE 40 MG PO TABS
80.0000 mg | ORAL_TABLET | Freq: Every day | ORAL | Status: DC
  Administered 2018-04-15: 80 mg via ORAL
  Filled 2018-04-15: qty 2

## 2018-04-15 MED ORDER — WARFARIN SODIUM 5 MG PO TABS
5.0000 mg | ORAL_TABLET | Freq: Every day | ORAL | Status: DC
  Filled 2018-04-15: qty 1

## 2018-04-15 MED ORDER — WARFARIN SODIUM 4 MG PO TABS
4.0000 mg | ORAL_TABLET | Freq: Every day | ORAL | Status: DC
  Filled 2018-04-15: qty 1

## 2018-04-15 MED ORDER — WARFARIN - PHARMACIST DOSING INPATIENT: Freq: Every day | Status: DC

## 2018-04-15 MED ORDER — PNEUMOCOCCAL VAC POLYVALENT 25 MCG/0.5ML IJ INJ
0.5000 mL | INJECTION | INTRAMUSCULAR | Status: AC
  Administered 2018-04-16: 0.5 mL via INTRAMUSCULAR
  Filled 2018-04-15: qty 0.5

## 2018-04-15 MED ORDER — WARFARIN SODIUM 5 MG PO TABS
5.0000 mg | ORAL_TABLET | Freq: Once | ORAL | Status: AC
  Administered 2018-04-15: 5 mg via ORAL
  Filled 2018-04-15: qty 1

## 2018-04-15 NOTE — NC FL2 (Signed)
[place] MEDICAID FL2 LEVEL OF CARE SCREENING TOOL     IDENTIFICATION  Patient Name: Emily Fry Birthdate: 08-14-1955 Sex: female Admission Date (Current Location): 04/14/2018  [place] and [place] Number:  Chiropodist and Address:  [facility], [address] [place], [place] [address]      Provider Number: [id number]  Attending Physician Name and Address:  Altamese Dilling, *  Relative Name and Phone Number:       Current Level of Care: Hospital Recommended Level of Care: Skilled Nursing Facility Prior Approval Number:    Date Approved/Denied:   PASRR Number: [id number])  Discharge Plan: SNF    Current Diagnoses: Patient Active Problem List   Diagnosis Date Noted  . Hypoxia 04/14/2018  . Hyponatremia 08/28/2015  . Hypokalemia 08/28/2015  . 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 ) Incontinent Weight: (!) 400 lb (181.4 kg) Height:  5\' 4"  (162.6 cm)  BEHAVIORAL SYMPTOMS/MOOD NEUROLOGICAL BOWEL NUTRITION STATUS      Continent Diet(Diet: Heart Healthy/ Carb Modified. )  AMBULATORY STATUS COMMUNICATION OF NEEDS Skin   Extensive Assist Verbally Normal                       Personal Care Assistance Level of Assistance  Bathing, Feeding, Dressing Bathing Assistance: Limited assistance Feeding assistance: Limited assistance Dressing Assistance: Limited assistance     Functional Limitations Info  Sight, Hearing, Speech Sight Info: Adequate Hearing Info: Adequate Speech Info: Adequate    SPECIAL CARE FACTORS FREQUENCY  PT (By licensed PT), OT (By licensed OT)     PT Frequency: (5) OT Frequency: (5)            Contractures      Additional Factors Info  Code Status, Allergies Code Status Info: (Full Code.  ) Allergies Info: (Contrast Media Iodinated Diagnostic Agents, Amoxicillin)           Current Medications (04/15/2018):  This is the current hospital active medication list Current Facility-Administered Medications  Medication Dose Route Frequency Provider Last Rate Last Dose  . acetaminophen (TYLENOL) tablet 650 mg  650 mg Oral Q6H PRN Enid Baas, MD   650 mg at 04/15/18 4128   Or  . acetaminophen (TYLENOL) suppository 650 mg  650 mg Rectal Q6H PRN Enid Baas, MD      . acetaZOLAMIDE (DIAMOX) tablet 250 mg  250 mg Oral BID Enid Baas, MD   250 mg at 04/15/18 0924  . aspirin EC tablet 81 mg  81 mg Oral Daily Enid Baas, MD   81 mg at 04/15/18 0919  . colchicine tablet 0.6 mg  0.6 mg Oral BID Enid Baas, MD   0.6 mg at 04/15/18 0919  . diltiazem (CARDIZEM CD) 24 hr capsule 240 mg  240 mg Oral QHS Enid Baas, MD   240 mg at 04/14/18 2332  . febuxostat (ULORIC) tablet 80 mg  80 mg Oral Daily Enid Baas, MD   80 mg at 04/15/18 0923  . furosemide (LASIX) tablet 80 mg  80 mg Oral Daily Altamese Dilling, MD   80 mg at 04/15/18 1126  . gabapentin (NEURONTIN) capsule 100 mg  100 mg Oral BID Enid Baas, MD   100 mg at 04/15/18 0919  . insulin aspart (novoLOG) injection 0-5 Units  0-5 Units Subcutaneous QHS  Enid Baas, MD      . insulin aspart (novoLOG) injection 0-9 Units  0-9 Units Subcutaneous TID WC Enid Baas, MD      . insulin glargine (LANTUS) injection 10 Units  10 Units Subcutaneous QHS Oralia Manis, MD      . magnesium oxide (MAG-OX) tablet 400 mg  400 mg Oral Daily Enid Baas, MD      . metoprolol tartrate (LOPRESSOR) tablet 12.5 mg  12.5 mg Oral BID Enid Baas, MD   12.5 mg at 04/15/18 0921  . mometasone-formoterol (DULERA) 200-5 MCG/ACT inhaler 2 puff  2 puff Inhalation BID Enid Baas, MD   2 puff at 04/15/18 0919  . ondansetron (ZOFRAN) tablet 4 mg  4 mg Oral Q6H PRN Enid Baas, MD       Or  . ondansetron (ZOFRAN) injection 4 mg  4 mg Intravenous Q6H PRN Enid Baas, MD      . pantoprazole (PROTONIX) EC tablet 40 mg  40 mg Oral Daily Enid Baas, MD      . Melene Muller ON 04/16/2018] pneumococcal 23 valent vaccine (PNU-IMMUNE) injection 0.5 mL  0.5 mL Intramuscular Tomorrow-1000 Altamese Dilling, MD      . potassium chloride SA (K-DUR,KLOR-CON) CR tablet 40 mEq  40 mEq Oral BID Enid Baas, MD   40 mEq at 04/15/18 0921  . simvastatin (ZOCOR) tablet 40 mg  40 mg Oral Daily Enid Baas, MD      . spironolactone (ALDACTONE) tablet 25 mg  25 mg Oral Daily Enid Baas, MD      . Melene Muller ON 04/16/2018] warfarin (COUMADIN) tablet 5 mg  5 mg Oral q1800 Ronnald Ramp, [facility]      . Warfarin - Pharmacist Dosing Inpatient   Does not apply q1800 Ronnald Ramp [facility]         Discharge Medications: Please see discharge summary for a list of discharge medications.  Relevant Imaging Results:  Relevant Lab Results:   Additional Information (SSN: [id number])  Chastin Garlitz, Darleen Crocker, LCSW

## 2018-04-15 NOTE — Care Management Obs Status (Signed)
MEDICARE OBSERVATION STATUS NOTIFICATION   Patient Details  Name: Natayla Tussing MRN: [id number] Date of Birth: 31-May-1956   Medicare Observation Status Notification Given:       Marily Memos, RN 04/15/2018, 9:33 AM

## 2018-04-15 NOTE — Care Management Note (Signed)
Case Management Note  Patient Details  Name: Emily Fry MRN: [id number] Date of Birth: 10-Dec-1955  Subjective/Objective:  Met with patient at bedside to deliver observation letter. Questions answered and copy provided.  Patient active with [facility] for HHPT. Sent to the ED for hypoxia after her home O2 tank wouldn't work and she became short of breath. She reports she uses 2 L chronically through [employer] but her concentrator and portable tank is broken. TC to [employer] while in room. Patient explained to [employer] representative that her concentrator and portable tank were both broken. Patient is to receive her new concentrator next Thursday and her portable tank here in the hospital tomorrow. Patient would like to speak with CSW regarding private pay at a SNF. CSW made aware. Should she go home they prefer to resume care with [facility].                   Action/Plan:   Expected Discharge Date:                  Expected Discharge Plan:  [place]  In-House Referral:  Clinical Social Work  Discharge planning Services  CM Consult  Post Acute Care Choice:  Home Health, Resumption of Svcs/PTA Provider Choice offered to:  Patient  DME Arranged:    DME Agency:     HH Arranged:  PT [place]:  [place]  Status of Service:  In process, will continue to follow  If discussed at Long Length of Stay Meetings, dates discussed:    Additional Comments:  Jolly Mango, RN 04/15/2018, 10:16 AM

## 2018-04-15 NOTE — Evaluation (Signed)
Physical Therapy Evaluation Patient Details Name: Emily Fry MRN: [id number] DOB: Mar 08, 1956 Today's Date: 04/15/2018   History of Present Illness  62 y/o female here with shortness of breath/hypoxia; apparently had issues with O2 compressor at home (on 2 liters at baseline) and HHPT checked sats and encouraged her to come to ED.  History of chronic respiratory failure on 2 L home oxygen, COPD, morbid obesity (400 lbs), sleep apnea, hypertension and atrial fibrillation.  Clinical Impression  Pt initially hesitant to do much, especially with recent weakness and inability to do any WBing/walking.  She did get to standing with +2 assist and she was unable to even attempt weight shift to shuffle/step even with much encouragement and +2 assist.  Pt is not safe to return home and has been struggling at home for some time now.  Poor tolerance to mobility tasks during and apart from exam as well as difficulty with even light exercises, pt will need increased assistance regardless of d/c disposition.     Follow Up Recommendations SNF;Supervision/Assistance - 24 hour    Equipment Recommendations  None recommended by PT    Recommendations for Other Services       Precautions / Restrictions Precautions Precautions: Fall Restrictions Weight Bearing Restrictions: No      Mobility  Bed Mobility Overal bed mobility: Needs Assistance Bed Mobility: Supine to Sit;Sit to Supine     Supine to sit: Mod assist Sit to supine: Max assist   General bed mobility comments: Pt struggled to get to sitting at EOB and despite good effort needed considerable assist with all mobility   Transfers Overall transfer level: Needs assistance Equipment used: Rolling walker (2 wheeled) Transfers: Sit to/from Stand Sit to Stand: +2 physical assistance;Mod assist         General transfer comment: Pt hesitant to get to standing and then showed poor tolerance and anxiety about maintaining standing stating "my  knees are going to buckle, I can't"  PT trying to encourage her to try again with +2 assist but is not confident or truly phyiscally able to do more  Ambulation/Gait             General Gait Details: unable to take steps, no unweighting of either side  Stairs            Wheelchair Mobility    Modified Rankin (Stroke Patients Only)       Balance Overall balance assessment: Needs assistance Sitting-balance support: Bilateral upper extremity supported Sitting balance-Leahy Scale: Fair     Standing balance support: Bilateral upper extremity supported Standing balance-Leahy Scale: Poor Standing balance comment: Pt feeling as though she could not trust knees in standing, highly reliant on walker                             Pertinent Vitals/Pain Pain Assessment: No/denies pain    Home Living Family/patient expects to be discharged to:: Skilled nursing facility Living Arrangements: Alone Available Help at Discharge: Family(cousin in regularly and assists with most tasks)           Home Equipment: Wheelchair - [employer] - 2 wheels      Prior Function Level of Independence: Needs assistance   Gait / Transfers Assistance Needed: apparently she has not walked in 2 months, "needs knee replacements but can't get them" and very much struggles to much mobility     Comments: Pt highly reliant, apparently lays on chucks in bed, does not  go to the bathroom and needs regular in-bed clean ups, etc     Hand Dominance        Extremity/Trunk Assessment   Upper Extremity Assessment Upper Extremity Assessment: Generalized weakness;Overall WFL for tasks assessed    Lower Extremity Assessment Lower Extremity Assessment: Generalized weakness(limited ROM secondary to size, weakness t/o LEs)       Communication   Communication: No difficulties  Cognition Arousal/Alertness: Awake/alert Behavior During Therapy: Anxious Overall Cognitive Status: Within  Functional Limits for tasks assessed                                        General Comments      Exercises General Exercises - Lower Extremity Ankle Circles/Pumps: AROM;5 reps Heel Slides: Strengthening;5 reps Hip ABduction/ADduction: Strengthening;5 reps   Assessment/Plan    PT Assessment Patient needs continued PT services  PT Problem List Decreased strength;Decreased range of motion;Decreased activity tolerance;Decreased balance;Decreased mobility;Decreased coordination;Decreased safety awareness;Decreased knowledge of use of DME;Obesity;Pain       PT Treatment Interventions DME instruction;Gait training;Stair training;Functional mobility training;Therapeutic activities;Therapeutic exercise;Balance training;Neuromuscular re-education;Patient/family education    PT Goals (Current goals can be found in the Care Plan section)  Acute Rehab PT Goals Patient Stated Goal: Pt wants to go to rehab to get back to doing some mobility/walking PT Goal Formulation: With patient Time For Goal Achievement: 04/29/18 Potential to Achieve Goals: Fair    Frequency Min 2X/week   Barriers to discharge        Co-evaluation               AM-PAC PT "6 Clicks" Daily Activity  Outcome Measure Difficulty turning over in bed (including adjusting bedclothes, sheets and blankets)?: Unable Difficulty moving from lying on back to sitting on the side of the bed? : Unable Difficulty sitting down on and standing up from a chair with arms (e.g., wheelchair, bedside commode, etc,.)?: Unable Help needed moving to and from a bed to chair (including a wheelchair)?: Total Help needed walking in hospital room?: Total Help needed climbing 3-5 steps with a railing? : Total 6 Click Score: 6    End of Session Equipment Utilized During Treatment: Gait belt Activity Tolerance: Patient limited by fatigue(anxious about WBing) Patient left: with bed alarm set;with call bell/phone within  reach Nurse Communication: Mobility status PT Visit Diagnosis: Muscle weakness (generalized) (M62.81);Difficulty in walking, not elsewhere classified (R26.2)    Time: 1610-9604 PT Time Calculation (min) (ACUTE ONLY): 29 min   Charges:   PT Evaluation $PT Eval Low Complexity: 1 Low          Malachi Pro, DPT 04/15/2018, 10:55 AM

## 2018-04-15 NOTE — Progress Notes (Signed)
Per [facility] admissions coordinator at [facility] they can make a bed offer and have a new bed that will accommodate patient. Per Lambert Mody patient will have to pay $7,390 up front to the facility before she can come. Clinical Social Worker (CSW) contacted patient's sister Annice Pih and made her aware of above. Sister accepted bed offer and stated that she would bring the check to the facility. Per Annice Pih she will get a family member to bring patient's cpap from home to [facility] tonight so it can go with her to the facility. Per RN case manager patient's portable oxygen tank will be delivered to patient tomorrow and patient's sister Annice Pih will make sure a family member picks it up and takes it home. Plan is for patient to D/C to [facility] tomorrow. Patient is aware of above. RN aware of above.   [employer], LCSW [phone]

## 2018-04-15 NOTE — Progress Notes (Signed)
ANTICOAGULATION CONSULT NOTE - Initial Consult  Pharmacy Consult for warfarin monitoring/dosing Indication: atrial fibrillation  Allergies  Allergen Reactions  . Contrast Media [Iodinated Diagnostic Agents] Swelling  . Amoxicillin Diarrhea, Itching and Other (See Comments)    Has patient had a PCN reaction causing immediate rash, facial/tongue/throat swelling, SOB or lightheadedness with hypotension: No Has patient had a PCN reaction causing severe rash involving mucus membranes or skin necrosis: No Has patient had a PCN reaction that required hospitalization: No Has patient had a PCN reaction occurring within the last 10 years: Yes If all of the above answers are "NO", then may proceed with Cephalosporin use.     Patient Measurements: Height: 5\' 4"  (162.6 cm) Weight: (!) 400 lb (181.4 kg) IBW/kg (Calculated) : 54.7  Vital Signs: Temp: 98.1 F (36.7 C) (11/05 0758) Temp Source: Oral (11/05 0758) BP: 122/59 (11/05 0758) Pulse Rate: 75 (11/05 0758)  Labs: Recent Labs    04/14/18 1550 04/14/18 1628 04/14/18 2326 04/15/18 0457  HGB 11.4*  --   --  10.6*  HCT 39.8  --   --  37.0  PLT 152  --   --  134*  APTT  --   --  36  --   LABPROT  --   --  14.2 15.2  INR  --   --  1.11 1.21  CREATININE  --  1.12*  --  1.21*  TROPONINI  --  <0.03  --   --     Estimated Creatinine Clearance: 82.3 mL/min (A) (by C-G formula based on SCr of 1.21 mg/dL (H)).   Medical History: Past Medical History:  Diagnosis Date  . A-fib (HCC)   . Chronic respiratory failure (HCC)    on 2L home o2  . COPD (chronic obstructive pulmonary disease) (HCC)   . Gout   . Hypertension   . Obesity   . Sleep apnea     Medications:  Scheduled:  . acetaZOLAMIDE  250 mg Oral BID  . aspirin EC  81 mg Oral Daily  . colchicine  0.6 mg Oral BID  . diltiazem  240 mg Oral QHS  . febuxostat  80 mg Oral Daily  . furosemide  80 mg Oral Daily  . gabapentin  100 mg Oral BID  . insulin aspart  0-5 Units  Subcutaneous QHS  . insulin aspart  0-9 Units Subcutaneous TID WC  . insulin glargine  10 Units Subcutaneous QHS  . magnesium oxide  400 mg Oral Daily  . metoprolol tartrate  12.5 mg Oral BID  . mometasone-formoterol  2 puff Inhalation BID  . pantoprazole  40 mg Oral Daily  . [START ON 04/16/2018] pneumococcal 23 valent vaccine  0.5 mL Intramuscular Tomorrow-1000  . potassium chloride SA  40 mEq Oral BID  . simvastatin  40 mg Oral Daily  . spironolactone  25 mg Oral Daily  . warfarin  4 mg Oral q1800    Assessment: Patient admitted for SOB s/t COPD exacerbation w/ h/o afib anticoagulated w/ warfarin (not currently in afib) EKG 11/4 shows sinus rhythm. PTA Warfarin: 5 mg daily at bedtime  11/4 2330 INR 1.11 subtherapeutic 11/5 0457 INR 1.21 subtherapeutic   Goal of Therapy:  INR 2-3 Monitor platelets by anticoagulation protocol: Yes   Plan:  Patient takes 5 mg daily at home. Gave warfarin 5 mg @0616  on 11/5 and will schedule 5 mg daily. Daily INR ordered. Monitor CBC daily.   [name], PharmD Clinical Pharmacist 04/15/2018

## 2018-04-15 NOTE — Clinical Social Work Note (Signed)
Clinical Social Work Assessment  Patient Details  Name: Emily Fry MRN: [id number] Date of Birth: 1955-11-14  Date of referral:  04/15/18               Reason for consult:  Facility Placement                Permission sought to share information with:  Chartered certified accountant granted to share information::  Yes, Verbal Permission Granted  Name::      [place]::   [place]   Relationship::     Contact Information:     Housing/Transportation Living arrangements for the past 2 months:  [place] of Information:  Patient, Siblings Patient Interpreter Needed:  None Criminal Activity/Legal Involvement Pertinent to Current Situation/Hospitalization:  No - Comment as needed Significant Relationships:  Siblings Lives with:  Self Do you feel safe going back to the place where you live?  Yes Need for family participation in patient care:  Yes (Comment)  Care giving concerns:  Patient lives in [place] alone.    Social Worker assessment / plan:  Holiday representative (CSW) received verbal consult from PT that recommendation is SNF. CSW met with patient and her aunt Emily Fry was at bedside. Patient was alert and oriented X4 and was laying in the bed. Patient called her sister Emily Fry [phone] and put her on speaker phone while CSW was in the room. Per patient she lives alone in [place] and has her own cpap machine at home. CSW explained that medicare will not pay for SNF because patient is under observation. Per patient's sister they can pay privately for SNF. Patient and sister requested SNF referral to be sent to [facility] and [facility] in [place]. FL2 complete and faxed out. CSW explained that patient's bed offers will be limited because of her need for bariatric equipment. Patient and sister verbalized their understanding. CSW will continue to follow and assist as needed.   Employment  status:  Disabled (Comment on whether or not currently receiving Disability), Retired Forensic scientist:  Medicare PT Recommendations:  [place] / Referral to community resources:  [place]  Patient/Family's Response to care:  Patient and her sister are agreeable to private pay for SNF.   Patient/Family's Understanding of and Emotional Response to Diagnosis, Current Treatment, and Prognosis:  Patient and her sister were very pleasant and thanked CSW for assistance.   Emotional Assessment Appearance:  Appears stated age Attitude/Demeanor/Rapport:    Affect (typically observed):  Accepting, Adaptable, Pleasant Orientation:  Oriented to Self, Oriented to Place, Oriented to  Time, Oriented to Situation Alcohol / Substance use:  Not Applicable Psych involvement (Current and /or in the community):  No (Comment)  Discharge Needs  Concerns to be addressed:  Discharge Planning Concerns Readmission within the last 30 days:  No Current discharge risk:  Dependent with Mobility Barriers to Discharge:  Continued Medical Work up   [employer], Emily Beets, LCSW 04/15/2018, 3:01 PM

## 2018-04-15 NOTE — Clinical Social Work Placement (Signed)
   CLINICAL SOCIAL WORK PLACEMENT  NOTE  Date:  04/15/2018  Patient Details  Name: Emily Fry MRN: [id number] Date of Birth: Oct 30, 1955  Clinical Social Work is seeking post-discharge placement for this patient at the Skilled  Nursing Facility level of care (*CSW will initial, date and re-position this form in  chart as items are completed):  Yes   Patient/family provided with [facility] Clinical Social Work Department's list of facilities offering this level of care within the geographic area requested by the patient (or if unable, by the patient's family).  Yes   Patient/family informed of their freedom to choose among providers that offer the needed level of care, that participate in Medicare, Medicaid or managed care program needed by the patient, have an available bed and are willing to accept the patient.  Yes   Patient/family informed of [facility]'s ownership interest in [facility] and [facility], as well as of the fact that they are under no obligation to receive care at these facilities.  PASRR submitted to EDS on       PASRR number received on       Existing PASRR number confirmed on 04/15/18     FL2 transmitted to all facilities in geographic area requested by pt/family on 04/15/18     FL2 transmitted to all facilities within larger geographic area on       Patient informed that his/her managed care company has contracts with or will negotiate with certain facilities, including the following:            Patient/family informed of bed offers received.  Patient chooses bed at       Physician recommends and patient chooses bed at      Patient to be transferred to   on  .  Patient to be transferred to facility by       Patient family notified on   of transfer.  Name of family member notified:        PHYSICIAN       Additional Comment:    _______________________________________________ Rayhaan Huster, Darleen Crocker, LCSW 04/15/2018, 3:00 PM

## 2018-04-15 NOTE — Progress Notes (Signed)
ANTICOAGULATION CONSULT NOTE - Initial Consult  Pharmacy Consult for warfarin monitoring/dosing Indication: atrial fibrillation  Allergies  Allergen Reactions  . Contrast Media [Iodinated Diagnostic Agents] Swelling  . Amoxicillin Diarrhea, Itching and Other (See Comments)    Has patient had a PCN reaction causing immediate rash, facial/tongue/throat swelling, SOB or lightheadedness with hypotension: No Has patient had a PCN reaction causing severe rash involving mucus membranes or skin necrosis: No Has patient had a PCN reaction that required hospitalization: No Has patient had a PCN reaction occurring within the last 10 years: Yes If all of the above answers are "NO", then may proceed with Cephalosporin use.     Patient Measurements: Height: 5\' 4"  (162.6 cm) Weight: (!) 400 lb (181.4 kg) IBW/kg (Calculated) : 54.7  Vital Signs: Temp: 98.3 F (36.8 C) (11/04 2253) Temp Source: Oral (11/04 2253) BP: 126/54 (11/04 2253) Pulse Rate: 93 (11/04 2253)  Labs: Recent Labs    04/14/18 1550 04/14/18 1628 04/14/18 2326  HGB 11.4*  --   --   HCT 39.8  --   --   PLT 152  --   --   APTT  --   --  36  LABPROT  --   --  14.2  INR  --   --  1.11  CREATININE  --  1.12*  --   TROPONINI  --  <0.03  --     Estimated Creatinine Clearance: 88.9 mL/min (A) (by C-G formula based on SCr of 1.12 mg/dL (H)).   Medical History: Past Medical History:  Diagnosis Date  . A-fib (HCC)   . Chronic respiratory failure (HCC)    on 2L home o2  . COPD (chronic obstructive pulmonary disease) (HCC)   . Gout   . Hypertension   . Obesity   . Sleep apnea     Medications:  Scheduled:  . acetaZOLAMIDE  250 mg Oral BID  . aspirin EC  81 mg Oral Daily  . colchicine  0.6 mg Oral BID  . diltiazem  240 mg Oral QHS  . febuxostat  80 mg Oral Daily  . furosemide  80 mg Oral QHS  . gabapentin  100 mg Oral BID  . insulin aspart  0-5 Units Subcutaneous QHS  . insulin aspart  0-9 Units Subcutaneous TID  WC  . insulin glargine  10 Units Subcutaneous QHS  . magnesium oxide  400 mg Oral Daily  . metoprolol tartrate  12.5 mg Oral BID  . mometasone-formoterol  2 puff Inhalation BID  . pantoprazole  40 mg Oral Daily  . potassium chloride SA  40 mEq Oral BID  . simvastatin  40 mg Oral Daily  . spironolactone  25 mg Oral Daily  . warfarin  5 mg Oral Once    Assessment: Patient admitted for SOB s/t COPD exacerbation w/ h/o afib anticoagulated w/ warfarin (not currently in afib) EKG 11/4 shows sinus rhythm. PTA Warfarin: 5 mg daily at bedtime  11/4 2330 INR 1.11 subtherapeutic  Goal of Therapy:  INR 2-3 Monitor platelets by anticoagulation protocol: Yes   Plan:  Will give patient warfarin 5 mg PO x 1 Will start patient on warfarin 4 mg daily @ 1800. Will monitor daily INRs and adjust per trend Will monitor daily CBC and for s/sx of bleeding.  Thomasene Ripple, PharmD, BCPS Clinical Pharmacist 04/15/2018

## 2018-04-15 NOTE — Progress Notes (Signed)
Pt. Declined CPAP. States she doesn't use her home CPAP. Only wears O2 via nasal cannula at home.

## 2018-04-15 NOTE — Progress Notes (Signed)
[facility] - [place] [facility]   PATIENT NAME: Emily Fry    MR#:  [id number]  DATE OF BIRTH:  June 16, 1955  SUBJECTIVE:  CHIEF COMPLAINT:   Chief Complaint  Patient presents with  . Shortness of Breath   Her oxygen delivery machine broke down and she was hypoxic so came to hospital.  Also feeling significantly weak. REVIEW OF SYSTEMS:  CONSTITUTIONAL: No fever,have fatigue or weakness.  EYES: No blurred or double vision.  EARS, NOSE, AND THROAT: No tinnitus or ear pain.  RESPIRATORY: No cough, shortness of breath, wheezing or hemoptysis.  CARDIOVASCULAR: No chest pain, orthopnea, edema.  GASTROINTESTINAL: No nausea, vomiting, diarrhea or abdominal pain.  GENITOURINARY: No dysuria, hematuria.  ENDOCRINE: No polyuria, nocturia,  HEMATOLOGY: No anemia, easy bruising or bleeding SKIN: No rash or lesion. MUSCULOSKELETAL: No joint pain or arthritis.   NEUROLOGIC: No tingling, numbness, weakness.  PSYCHIATRY: No anxiety or depression.   ROS  DRUG ALLERGIES:   Allergies  Allergen Reactions  . Contrast Media [Iodinated Diagnostic Agents] Swelling  . Amoxicillin Diarrhea, Itching and Other (See Comments)    Has patient had a PCN reaction causing immediate rash, facial/tongue/throat swelling, SOB or lightheadedness with hypotension: No Has patient had a PCN reaction causing severe rash involving mucus membranes or skin necrosis: No Has patient had a PCN reaction that required hospitalization: No Has patient had a PCN reaction occurring within the last 10 years: Yes If all of the above answers are "NO", then may proceed with Cephalosporin use.     VITALS:  Blood pressure 116/66, pulse 79, temperature 98.7 F (37.1 C), temperature source Oral, resp. rate 16, height 5\' 4"  (1.626 m), weight (!) 181.4 kg, SpO2 95 %.  PHYSICAL EXAMINATION:  GENERAL:  62 y.o.-year-old morbidly obese patient lying in the bed with no acute distress.  EYES: Pupils equal, round,  reactive to light and accommodation. No scleral icterus. Extraocular muscles intact.  HEENT: Head atraumatic, normocephalic. Oropharynx and nasopharynx clear.  NECK:  Supple, no jugular venous distention. No thyroid enlargement, no tenderness.  LUNGS: Normal breath sounds bilaterally, no wheezing, rales,rhonchi or crepitation. No use of accessory muscles of respiration.  CARDIOVASCULAR: S1, S2 normal. No murmurs, rubs, or gallops.  ABDOMEN: Soft, nontender, nondistended. Bowel sounds present. No organomegaly or mass.  EXTREMITIES: No pedal edema, cyanosis, or clubbing.  NEUROLOGIC: Cranial nerves II through XII are intact. Muscle strength 3/5 in all extremities. Sensation intact. Gait not checked.  PSYCHIATRIC: The patient is alert and oriented x 3.  SKIN: No obvious rash, lesion, or ulcer.   Physical Exam LABORATORY PANEL:   CBC Recent Labs  Lab 04/15/18 0457  WBC 5.2  HGB 10.6*  HCT 37.0  PLT 134*   ------------------------------------------------------------------------------------------------------------------  Chemistries  Recent Labs  Lab 04/14/18 1628 04/15/18 0457  NA 144 143  K 4.2 4.0  CL 111 110  CO2 28 30  GLUCOSE 108* 98  BUN 20 17  CREATININE 1.12* 1.21*  CALCIUM 8.7* 8.5*  AST 26  --   ALT 25  --   ALKPHOS 93  --   BILITOT 0.5  --    ------------------------------------------------------------------------------------------------------------------  Cardiac Enzymes Recent Labs  Lab 04/14/18 1628  TROPONINI <0.03   ------------------------------------------------------------------------------------------------------------------  RADIOLOGY:  Dg Chest Portable 1 View  Result Date: 04/14/2018 CLINICAL DATA:  Short of breath EXAM: PORTABLE CHEST 1 VIEW COMPARISON:  03/09/2018 FINDINGS: Cardiac enlargement without heart failure. Mild atelectasis in the lung bases with decreased lung volume compared to the  prior study. Negative for pneumonia or effusion  IMPRESSION: Hypoventilation with mild bibasilar atelectasis Electronically Signed   By: Marlan Palau M.D.   On: 04/14/2018 16:19    ASSESSMENT AND PLAN:   Active Problems:   Hypoxia  1.    Acute on chronic respiratory failure with hypoxia-secondary to malfunctioning oxygen concentrator at home. -Sats improved on 2 L oxygen here which is her chronic home O2 -Chest x-ray showing hypoventilation.  Patient likely has obesity hypoventilation syndrome -Care manager consult for setting up home oxygen  2.  Chronic respiratory failure secondary to COPD-stable, no active wheezing.  Continue home inhalers and PRN nebulizers. -Continue CPAP at bedtime for sleep apnea.  3.  Chronic diastolic heart failure-stable.  Continue oral Lasix and potassium supplements Patient states she was not taking Aldactone, stopped that.  4.  Atrial fibrillation-rate controlled.  On Cardizem and on warfarin for anticoagulation  5.  DVT prophylaxis-already on warfarin.  Pharmacy to adjust the dose.  6.  Morbid obesity Advised dietary control and regular exercise, follow-up with primary care physician.   All the records are reviewed and case discussed with Care Management/Social Workerr. Management plans discussed with the patient, family and they are in agreement.  CODE STATUS: Full.  TOTAL TIME TAKING CARE OF THIS PATIENT: 35 minutes.     POSSIBLE D/C IN 1-2 DAYS, DEPENDING ON CLINICAL CONDITION.   Altamese Dilling M.D on 04/15/2018   Between 7am to 6pm - Pager - [phone]  After 6pm go to [email or web address] - Social research officer, government  [facility]  Office  [phone]  CC: Primary care physician; Lauro Regulus, MD  Note: This dictation was prepared with Dragon dictation along with smaller phrase technology. Any transcriptional errors that result from this process are unintentional.

## 2018-04-16 DIAGNOSIS — J9621 Acute and chronic respiratory failure with hypoxia: Secondary | ICD-10-CM | POA: Diagnosis not present

## 2018-04-16 LAB — GLUCOSE, CAPILLARY
GLUCOSE-CAPILLARY: 76 mg/dL (ref 70–99)
GLUCOSE-CAPILLARY: 110 mg/dL — AB (ref 70–99)

## 2018-04-16 LAB — PROTIME-INR
INR: 1.11
Prothrombin Time: 14.2 seconds (ref 11.4–15.2)

## 2018-04-16 LAB — HIV ANTIBODY (ROUTINE TESTING W REFLEX): HIV Screen 4th Generation wRfx: NONREACTIVE

## 2018-04-16 MED ORDER — MAGNESIUM OXIDE 400 (241.3 MG) MG PO TABS: 400.0000 mg | ORAL_TABLET | Freq: Every day | ORAL | 5 refills | Status: DC

## 2018-04-16 MED ORDER — WARFARIN SODIUM 6 MG PO TABS: 6.0000 mg | ORAL_TABLET | Freq: Every day | ORAL | 0 refills | Status: DC

## 2018-04-16 NOTE — Progress Notes (Signed)
Patient was discharged with EMS. IV removed by Ricki Rodriguez, NT. On 2L O2. Report called. Packet sent with patient. Belongings along with pillow sent with patient. Family aware of transport.

## 2018-04-16 NOTE — Discharge Summary (Signed)
[facility] [place] [facility]   PATIENT NAME: Emily Fry    MR#:  [id number]  DATE OF BIRTH:  08-16-1962  DATE OF ADMISSION:  04/14/2018 ADMITTING PHYSICIAN: Enid Baas, MD  DATE OF DISCHARGE: 04/16/2018   PRIMARY CARE PHYSICIAN: Lauro Regulus, MD    ADMISSION DIAGNOSIS:  Hypoxia [R09.02]  DISCHARGE DIAGNOSIS:  Active Problems:   Hypoxia   SECONDARY DIAGNOSIS:   Past Medical History:  Diagnosis Date  . A-fib (HCC)   . Chronic respiratory failure (HCC)    on 2L home o2  . COPD (chronic obstructive pulmonary disease) (HCC)   . Gout   . Hypertension   . Obesity   . Sleep apnea     HOSPITAL COURSE:   1.   Acute on chronic respiratory failure with hypoxia-secondary to malfunctioning oxygen concentrator at home. -Sats improved on 2 L oxygen here which is her chronic home O2 -Chest x-ray showing hypoventilation. Patient likely has obesity hypoventilation syndrome -Care manager consult for setting up home oxygen  2. Chronic respiratory failure secondary to COPD-stable, no active wheezing. Continue home inhalers and PRN nebulizers. -Continue CPAP at bedtime for sleep apnea.  3. Chronic diastolic heart failure-stable. Continue oral Lasix and potassium supplements Patient states she was not taking Aldactone, stopped that.  4. Atrial fibrillation-rate controlled. On Cardizem and on warfarin for anticoagulation  INR is low, changed dose, Follow INR in next 4-5 days.  5.DVT prophylaxis-already on warfarin. Pharmacy to adjust the dose.  6.  Morbid obesity Advised dietary control and regular exercise, follow-up with primary care physician.  Due to pt's generalized weakness and needing 2+ assist on standing up and not able walk during PT eval, she will need to go to rehab.  SW is arranging for placement.  DISCHARGE CONDITIONS:   Stable.  CONSULTS OBTAINED:    DRUG ALLERGIES:   Allergies  Allergen  Reactions  . Contrast Media [Iodinated Diagnostic Agents] Swelling  . Amoxicillin Diarrhea, Itching and Other (See Comments)    Has patient had a PCN reaction causing immediate rash, facial/tongue/throat swelling, SOB or lightheadedness with hypotension: No Has patient had a PCN reaction causing severe rash involving mucus membranes or skin necrosis: No Has patient had a PCN reaction that required hospitalization: No Has patient had a PCN reaction occurring within the last 10 years: Yes If all of the above answers are "NO", then may proceed with Cephalosporin use.     DISCHARGE MEDICATIONS:   Allergies as of 04/16/2018      Reactions   Contrast Media [iodinated Diagnostic Agents] Swelling   Amoxicillin Diarrhea, Itching, Other (See Comments)   Has patient had a PCN reaction causing immediate rash, facial/tongue/throat swelling, SOB or lightheadedness with hypotension: No Has patient had a PCN reaction causing severe rash involving mucus membranes or skin necrosis: No Has patient had a PCN reaction that required hospitalization: No Has patient had a PCN reaction occurring within the last 10 years: Yes If all of the above answers are "NO", then may proceed with Cephalosporin use.      Medication List    TAKE these medications   acetaZOLAMIDE 250 MG tablet Commonly known as:  DIAMOX Take 250 mg by mouth 2 (two) times daily.   aspirin EC 81 MG tablet Take 81 mg by mouth daily.   CARTIA XT 240 MG 24 hr capsule Generic drug:  diltiazem Take 240 mg by mouth at bedtime.   colchicine 0.6 MG tablet Take 0.6 mg by  mouth 2 (two) times daily.   Febuxostat 80 MG Tabs Take 80 mg by mouth daily.   furosemide 80 MG tablet Commonly known as:  LASIX Take 80 mg by mouth at bedtime.   gabapentin 100 MG capsule Commonly known as:  NEURONTIN Take 100 mg by mouth 2 (two) times daily.   Insulin Glargine 100 UNIT/ML Solostar Pen Commonly known as:  LANTUS Inject 45 Units into the skin  daily at 10 pm. What changed:  how much to take   magnesium oxide 400 (241.3 Mg) MG tablet Commonly known as:  MAG-OX Take 1 tablet (400 mg total) by mouth daily.   metoprolol tartrate 25 MG tablet Commonly known as:  LOPRESSOR Take 25 mg by mouth at bedtime.   pantoprazole 40 MG tablet Commonly known as:  PROTONIX Take 40 mg by mouth daily.   potassium chloride SA 20 MEQ tablet Commonly known as:  K-DUR,KLOR-CON Take 60 mEq by mouth at bedtime.   simvastatin 40 MG tablet Commonly known as:  ZOCOR Take 40 mg by mouth at bedtime.   TRULICITY 1.5 MG/0.5ML Sopn Generic drug:  Dulaglutide Inject 1.5 mg into the skin once a week. On saturday   warfarin 6 MG tablet Commonly known as:  COUMADIN Take 1 tablet (6 mg total) by mouth daily at 6 PM for 10 doses. Need to check INR in 4-5 days. What changed:    medication strength  how much to take  when to take this  additional instructions        DISCHARGE INSTRUCTIONS:    Follow with PMD in 1-2 weeks.  If you experience worsening of your admission symptoms, develop shortness of breath, life threatening emergency, suicidal or homicidal thoughts you must seek medical attention immediately by calling 911 or calling your MD immediately  if symptoms less severe.  You Must read complete instructions/literature along with all the possible adverse reactions/side effects for all the Medicines you take and that have been prescribed to you. Take any new Medicines after you have completely understood and accept all the possible adverse reactions/side effects.   Please note  You were cared for by a hospitalist during your hospital stay. If you have any questions about your discharge medications or the care you received while you were in the hospital after you are discharged, you can call the unit and asked to speak with the hospitalist on call if the hospitalist that took care of you is not available. Once you are discharged, your  primary care physician will handle any further medical issues. Please note that NO REFILLS for any discharge medications will be authorized once you are discharged, as it is imperative that you return to your primary care physician (or establish a relationship with a primary care physician if you do not have one) for your aftercare needs so that they can reassess your need for medications and monitor your lab values.    Today   CHIEF COMPLAINT:   Chief Complaint  Patient presents with  . Shortness of Breath    HISTORY OF PRESENT ILLNESS:  Emily Fry  is a 62 y.o. female with a known history of chronic respiratory failure on 2 L home oxygen, COPD, morbid obesity, sleep apnea, hypertension and atrial fibrillation on Coumadin at home presents to hospital secondary to worsening shortness of breath. Patient states at home by herself and her cousin helps her.  She used to ambulate with a walker to the bathroom but not recently.  She has a  home health physical therapist who came to check on her today and she complained of worsening shortness of breath they checked her sats and they were noted to be low and so sent to the emergency room.  Also she is noted to have oxygen concentrator at home not functioning today.  Chest x-ray showing bibasilar atelectasis and hypoventilation.  Her sats improved with the dose of IV Lasix and placing her on 2 L oxygen.  And home oxygen concentrator could not be arranged tonight and so she is getting admitted for the same under observation.   VITAL SIGNS:  Blood pressure 112/61, pulse 82, temperature 98.2 F (36.8 C), temperature source Oral, resp. rate 19, height 5\' 4"  (1.626 m), weight (!) 181.4 kg, SpO2 96 %.  I/O:    Intake/Output Summary (Last 24 hours) at 04/16/2018 1009 Last data filed at 04/15/2018 2335 Gross per 24 hour  Intake -  Output 1600 ml  Net -1600 ml    PHYSICAL EXAMINATION:   GENERAL:  62 y.o.-year-old morbidly obese patient lying in the  bed with no acute distress.  EYES: Pupils equal, round, reactive to light and accommodation. No scleral icterus. Extraocular muscles intact.  HEENT: Head atraumatic, normocephalic. Oropharynx and nasopharynx clear.  NECK:  Supple, no jugular venous distention. No thyroid enlargement, no tenderness.  LUNGS: Normal breath sounds bilaterally, no wheezing, rales,rhonchi or crepitation. No use of accessory muscles of respiration.  CARDIOVASCULAR: S1, S2 normal. No murmurs, rubs, or gallops.  ABDOMEN: Soft, nontender, nondistended. Bowel sounds present. No organomegaly or mass.  EXTREMITIES: No pedal edema, cyanosis, or clubbing.  NEUROLOGIC: Cranial nerves II through XII are intact. Muscle strength 2-3/5 in all extremities. Sensation intact. Gait not checked.  PSYCHIATRIC: The patient is alert and oriented x 3.  SKIN: No obvious rash, lesion, or ulcer.   DATA REVIEW:   CBC Recent Labs  Lab 04/15/18 0457  WBC 5.2  HGB 10.6*  HCT 37.0  PLT 134*    Chemistries  Recent Labs  Lab 04/14/18 1628 04/15/18 0457  NA 144 143  K 4.2 4.0  CL 111 110  CO2 28 30  GLUCOSE 108* 98  BUN 20 17  CREATININE 1.12* 1.21*  CALCIUM 8.7* 8.5*  AST 26  --   ALT 25  --   ALKPHOS 93  --   BILITOT 0.5  --     Cardiac Enzymes Recent Labs  Lab 04/14/18 1628  TROPONINI <0.03    Microbiology Results  Results for orders placed or performed during the hospital encounter of 08/25/15  MRSA PCR Screening     Status: None   Collection Time: 08/25/15  8:50 AM  Result Value Ref Range Status   MRSA by PCR NEGATIVE NEGATIVE Final    Comment:        The GeneXpert MRSA Assay (FDA approved for NASAL specimens only), is one component of a comprehensive MRSA colonization surveillance program. It is not intended to diagnose MRSA infection nor to guide or monitor treatment for MRSA infections.     RADIOLOGY:  Dg Chest Portable 1 View  Result Date: 04/14/2018 CLINICAL DATA:  Short of breath EXAM:  PORTABLE CHEST 1 VIEW COMPARISON:  03/09/2018 FINDINGS: Cardiac enlargement without heart failure. Mild atelectasis in the lung bases with decreased lung volume compared to the prior study. Negative for pneumonia or effusion IMPRESSION: Hypoventilation with mild bibasilar atelectasis Electronically Signed   By: Marlan Palau M.D.   On: 04/14/2018 16:19    EKG:   Orders  placed or performed during the hospital encounter of 04/14/18  . EKG 12-Lead  . EKG 12-Lead  . ED EKG  . ED EKG  . EKG      Management plans discussed with the patient, family and they are in agreement.  CODE STATUS: full;    Code Status Orders  (From admission, onward)         Start     Ordered   04/14/18 2247  Full code  Continuous     04/14/18 2246        Code Status History    Date Active Date Inactive Code Status Order ID Comments User Context   08/25/2015 0914 08/28/2015 2013 Full Code [id number]  Ihor Austin, MD Inpatient      TOTAL TIME TAKING CARE OF THIS PATIENT: 35 minutes.    Altamese Dilling M.D on 04/16/2018 at 10:09 AM  Between 7am to 6pm - Pager - [phone]  After 6pm go to [email or web address] - Social research officer, government  [facility]  Office  [phone]  CC: Primary care physician; Lauro Regulus, MD   Note: This dictation was prepared with Dragon dictation along with smaller phrase technology. Any transcriptional errors that result from this process are unintentional.

## 2018-04-16 NOTE — Discharge Instructions (Signed)
Coumadin dose changed, check INR in 4-5 days. Cont using CPAP at night as home use.

## 2018-04-16 NOTE — Progress Notes (Signed)
ANTICOAGULATION CONSULT NOTE - Initial Consult  Pharmacy Consult for warfarin monitoring/dosing Indication: atrial fibrillation  Allergies  Allergen Reactions  . Contrast Media [Iodinated Diagnostic Agents] Swelling  . Amoxicillin Diarrhea, Itching and Other (See Comments)    Has patient had a PCN reaction causing immediate rash, facial/tongue/throat swelling, SOB or lightheadedness with hypotension: No Has patient had a PCN reaction causing severe rash involving mucus membranes or skin necrosis: No Has patient had a PCN reaction that required hospitalization: No Has patient had a PCN reaction occurring within the last 10 years: Yes If all of the above answers are "NO", then may proceed with Cephalosporin use.     Patient Measurements: Height: 5\' 4"  (162.6 cm) Weight: (!) 400 lb (181.4 kg) IBW/kg (Calculated) : 54.7  Vital Signs: Temp: 98.2 F (36.8 C) (11/06 0748) Temp Source: Oral (11/06 0748) BP: 112/61 (11/06 0748) Pulse Rate: 82 (11/06 0748)  Labs: Recent Labs    04/14/18 1550 04/14/18 1628 04/14/18 2326 04/15/18 0457 04/16/18 0459  HGB 11.4*  --   --  10.6*  --   HCT 39.8  --   --  37.0  --   PLT 152  --   --  134*  --   APTT  --   --  36  --   --   LABPROT  --   --  14.2 15.2 14.2  INR  --   --  1.11 1.21 1.11  CREATININE  --  1.12*  --  1.21*  --   TROPONINI  --  <0.03  --   --   --     Estimated Creatinine Clearance: 82.3 mL/min (A) (by C-G formula based on SCr of 1.21 mg/dL (H)).   Medical History: Past Medical History:  Diagnosis Date  . A-fib (HCC)   . Chronic respiratory failure (HCC)    on 2L home o2  . COPD (chronic obstructive pulmonary disease) (HCC)   . Gout   . Hypertension   . Obesity   . Sleep apnea     Medications:  Scheduled:  . acetaZOLAMIDE  250 mg Oral BID  . aspirin EC  81 mg Oral Daily  . colchicine  0.6 mg Oral BID  . diltiazem  240 mg Oral QHS  . febuxostat  80 mg Oral Daily  . furosemide  80 mg Oral Daily  .  gabapentin  100 mg Oral BID  . insulin aspart  0-5 Units Subcutaneous QHS  . insulin aspart  0-9 Units Subcutaneous TID WC  . insulin glargine  10 Units Subcutaneous QHS  . magnesium oxide  400 mg Oral Daily  . metoprolol tartrate  12.5 mg Oral BID  . mometasone-formoterol  2 puff Inhalation BID  . pantoprazole  40 mg Oral Daily  . pneumococcal 23 valent vaccine  0.5 mL Intramuscular Tomorrow-1000  . potassium chloride SA  40 mEq Oral BID  . simvastatin  40 mg Oral Daily  . warfarin  5 mg Oral q1800  . Warfarin - Pharmacist Dosing Inpatient   Does not apply q1800    Assessment: Patient admitted for SOB s/t COPD exacerbation w/ h/o afib anticoagulated w/ warfarin (not currently in afib) EKG 11/4 shows sinus rhythm. PTA Warfarin: 5 mg daily at bedtime:  Date INR Warfarin Dose  11/4 1.11   11/5 1.21 5 mg  11/6 1.11 5 mg (ordered)    Goal of Therapy:  INR 2-3 Monitor platelets by anticoagulation protocol: Yes   Plan:  Patient takes 5  mg daily at home. Gave warfarin 5 mg @0616  on 11/5 and will schedule 5 mg daily. Daily INR ordered. Monitor CBC daily.   [name], PharmD Clinical Pharmacist 04/16/2018

## 2018-04-16 NOTE — Progress Notes (Signed)
Patient is medically stable for D/C to [facility] today. Per Tracie admissions coordinator at [employer] patient's sister Annice Pih is mailing a check to [facility]. Per Tracie patient can come today to room 607. RN will call report and arrange EMS for transport. Clinical Child psychotherapist (CSW) sent D/C orders to [facility] via HUB. CSW contacted patient's sister Annice Pih and made her aware of above. Per sister a family member brought patient's cpap to the facility already. Per Annice Pih she will send a family member to pick up patient's portable oxygen tank when it gets delivered to [facility] today. Patient is aware of above. Nursing secretary has agreed to call Annice Pih when the portable tank arrives at [facility]. RN case manager aware of above. Please reconsult if future social work needs arise. CSW signing off.   [employer], LCSW [phone]

## 2018-04-16 NOTE — Clinical Social Work Placement (Signed)
   CLINICAL SOCIAL WORK PLACEMENT  NOTE  Date:  04/16/2018  Patient Details  Name: Emily Fry MRN: [id number] Date of Birth: [birth year]  Clinical Social Work is seeking post-discharge placement for this patient at the Skilled  Nursing Facility level of care (*CSW will initial, date and re-position this form in  chart as items are completed):  Yes   Patient/family provided with [place] Clinical Social Work Department's list of facilities offering this level of care within the geographic area requested by the patient (or if unable, by the patient's family).  Yes   Patient/family informed of their freedom to choose among providers that offer the needed level of care, that participate in Medicare, Medicaid or managed care program needed by the patient, have an available bed and are willing to accept the patient.  Yes   Patient/family informed of [facility]'s ownership interest in [facility] and [facility], as well as of the fact that they are under no obligation to receive care at these facilities.  PASRR submitted to EDS on       PASRR number received on       Existing PASRR number confirmed on 04/15/18     FL2 transmitted to all facilities in geographic area requested by pt/family on 04/15/18     FL2 transmitted to all facilities within larger geographic area on       Patient informed that his/her managed care company has contracts with or will negotiate with certain facilities, including the following:        Yes   Patient/family informed of bed offers received.  Patient chooses bed at [facility] )     Physician recommends and patient chooses bed at      Patient to be transferred to ([facility] ) on 04/16/18.  Patient to be transferred to facility by [facility] )     Patient family notified on 04/16/18 of transfer.  Name of family member notified:  (Patient's sister Emily Fry is aware of D/C today. )     PHYSICIAN       Additional  Comment:    _______________________________________________ Emily Fry, Darleen Crocker, LCSW 04/16/2018, 11:11 AM

## 2018-10-07 ENCOUNTER — Emergency Department (HOSPITAL_COMMUNITY): Payer: Medicare Other

## 2018-10-07 ENCOUNTER — Encounter (HOSPITAL_COMMUNITY): Payer: Self-pay | Admitting: Emergency Medicine

## 2018-10-07 ENCOUNTER — Other Ambulatory Visit: Payer: Self-pay

## 2018-10-07 ENCOUNTER — Inpatient Hospital Stay (HOSPITAL_COMMUNITY)
Admission: EM | Admit: 2018-10-07 | Discharge: 2018-10-09 | DRG: 190 | Disposition: A | Discharge status: Skilled Nursing Facility | Payer: Medicare Other | Attending: Family Medicine | Admitting: Family Medicine

## 2018-10-07 DIAGNOSIS — I5032 Chronic diastolic (congestive) heart failure: Secondary | ICD-10-CM | POA: Diagnosis present

## 2018-10-07 DIAGNOSIS — M109 Gout, unspecified: Secondary | ICD-10-CM

## 2018-10-07 DIAGNOSIS — E872 Acidosis: Secondary | ICD-10-CM | POA: Diagnosis present

## 2018-10-07 DIAGNOSIS — Z20828 Contact with and (suspected) exposure to other viral communicable diseases: Secondary | ICD-10-CM | POA: Diagnosis present

## 2018-10-07 DIAGNOSIS — Z6841 Body Mass Index (BMI) 40.0 and over, adult: Secondary | ICD-10-CM | POA: Diagnosis not present

## 2018-10-07 DIAGNOSIS — J441 Chronic obstructive pulmonary disease with (acute) exacerbation: Secondary | ICD-10-CM | POA: Diagnosis present

## 2018-10-07 DIAGNOSIS — Y95 Nosocomial condition: Secondary | ICD-10-CM | POA: Diagnosis present

## 2018-10-07 DIAGNOSIS — N183 Chronic kidney disease, stage 3 (moderate): Secondary | ICD-10-CM | POA: Diagnosis present

## 2018-10-07 DIAGNOSIS — J9621 Acute and chronic respiratory failure with hypoxia: Secondary | ICD-10-CM | POA: Diagnosis present

## 2018-10-07 DIAGNOSIS — R4182 Altered mental status, unspecified: Secondary | ICD-10-CM | POA: Diagnosis present

## 2018-10-07 DIAGNOSIS — J440 Chronic obstructive pulmonary disease with acute lower respiratory infection: Secondary | ICD-10-CM | POA: Diagnosis present

## 2018-10-07 DIAGNOSIS — J181 Lobar pneumonia, unspecified organism: Secondary | ICD-10-CM | POA: Diagnosis not present

## 2018-10-07 DIAGNOSIS — E1122 Type 2 diabetes mellitus with diabetic chronic kidney disease: Secondary | ICD-10-CM | POA: Diagnosis present

## 2018-10-07 DIAGNOSIS — I130 Hypertensive heart and chronic kidney disease with heart failure and stage 1 through stage 4 chronic kidney disease, or unspecified chronic kidney disease: Secondary | ICD-10-CM | POA: Diagnosis present

## 2018-10-07 DIAGNOSIS — J411 Mucopurulent chronic bronchitis: Secondary | ICD-10-CM | POA: Diagnosis not present

## 2018-10-07 DIAGNOSIS — Z7901 Long term (current) use of anticoagulants: Secondary | ICD-10-CM

## 2018-10-07 DIAGNOSIS — K219 Gastro-esophageal reflux disease without esophagitis: Secondary | ICD-10-CM | POA: Diagnosis present

## 2018-10-07 DIAGNOSIS — Z91018 Allergy to other foods: Secondary | ICD-10-CM

## 2018-10-07 DIAGNOSIS — R251 Tremor, unspecified: Secondary | ICD-10-CM | POA: Diagnosis present

## 2018-10-07 DIAGNOSIS — Z79899 Other long term (current) drug therapy: Secondary | ICD-10-CM

## 2018-10-07 DIAGNOSIS — Z9981 Dependence on supplemental oxygen: Secondary | ICD-10-CM

## 2018-10-07 DIAGNOSIS — I5030 Unspecified diastolic (congestive) heart failure: Secondary | ICD-10-CM

## 2018-10-07 DIAGNOSIS — J309 Allergic rhinitis, unspecified: Secondary | ICD-10-CM | POA: Diagnosis present

## 2018-10-07 DIAGNOSIS — Z87891 Personal history of nicotine dependence: Secondary | ICD-10-CM

## 2018-10-07 DIAGNOSIS — M1A09X1 Idiopathic chronic gout, multiple sites, with tophus (tophi): Secondary | ICD-10-CM | POA: Diagnosis not present

## 2018-10-07 DIAGNOSIS — I10 Essential (primary) hypertension: Secondary | ICD-10-CM

## 2018-10-07 DIAGNOSIS — I2510 Atherosclerotic heart disease of native coronary artery without angina pectoris: Secondary | ICD-10-CM | POA: Diagnosis present

## 2018-10-07 DIAGNOSIS — J9622 Acute and chronic respiratory failure with hypercapnia: Secondary | ICD-10-CM | POA: Diagnosis present

## 2018-10-07 DIAGNOSIS — I4891 Unspecified atrial fibrillation: Secondary | ICD-10-CM

## 2018-10-07 DIAGNOSIS — J9611 Chronic respiratory failure with hypoxia: Secondary | ICD-10-CM | POA: Diagnosis not present

## 2018-10-07 DIAGNOSIS — R400 Somnolence: Secondary | ICD-10-CM | POA: Diagnosis not present

## 2018-10-07 DIAGNOSIS — R0689 Other abnormalities of breathing: Secondary | ICD-10-CM | POA: Diagnosis not present

## 2018-10-07 DIAGNOSIS — I252 Old myocardial infarction: Secondary | ICD-10-CM

## 2018-10-07 DIAGNOSIS — M170 Bilateral primary osteoarthritis of knee: Secondary | ICD-10-CM | POA: Diagnosis present

## 2018-10-07 DIAGNOSIS — Z7982 Long term (current) use of aspirin: Secondary | ICD-10-CM

## 2018-10-07 DIAGNOSIS — G4733 Obstructive sleep apnea (adult) (pediatric): Secondary | ICD-10-CM | POA: Diagnosis present

## 2018-10-07 DIAGNOSIS — J189 Pneumonia, unspecified organism: Secondary | ICD-10-CM

## 2018-10-07 DIAGNOSIS — Z91041 Radiographic dye allergy status: Secondary | ICD-10-CM

## 2018-10-07 DIAGNOSIS — Z794 Long term (current) use of insulin: Secondary | ICD-10-CM

## 2018-10-07 DIAGNOSIS — Z880 Allergy status to penicillin: Secondary | ICD-10-CM

## 2018-10-07 DIAGNOSIS — J449 Chronic obstructive pulmonary disease, unspecified: Secondary | ICD-10-CM

## 2018-10-07 HISTORY — DX: Gastro-esophageal reflux disease without esophagitis: K21.9

## 2018-10-07 HISTORY — DX: Bilateral primary osteoarthritis of knee: M17.0

## 2018-10-07 HISTORY — DX: Unspecified diastolic (congestive) heart failure: I50.30

## 2018-10-07 HISTORY — DX: Allergic rhinitis, unspecified: J30.9

## 2018-10-07 LAB — COMPREHENSIVE METABOLIC PANEL
ALT: 20 U/L (ref 0–44)
AST: 20 U/L (ref 15–41)
Albumin: 3.1 g/dL — ABNORMAL LOW (ref 3.5–5.0)
Alkaline Phosphatase: 141 U/L — ABNORMAL HIGH (ref 38–126)
Anion gap: 8 (ref 5–15)
BUN: 28 mg/dL — ABNORMAL HIGH (ref 6–20)
CO2: 35 mmol/L — ABNORMAL HIGH (ref 22–32)
Calcium: 9.4 mg/dL (ref 8.9–10.3)
Chloride: 101 mmol/L (ref 98–111)
Creatinine, Ser: 1.18 mg/dL — ABNORMAL HIGH (ref 0.44–1.00)
GFR calc Af Amer: 58 mL/min — ABNORMAL LOW (ref >60)
GFR calc non Af Amer: 50 mL/min — ABNORMAL LOW (ref >60)
Glucose, Bld: 140 mg/dL — ABNORMAL HIGH (ref 70–99)
Potassium: 4.5 mmol/L (ref 3.5–5.1)
Sodium: 144 mmol/L (ref 135–145)
Total Bilirubin: 0.4 mg/dL (ref 0.3–1.2)
Total Protein: 6.6 g/dL (ref 6.5–8.1)

## 2018-10-07 LAB — CBC WITH DIFFERENTIAL/PLATELET
Abs Immature Granulocytes: 0.05 10*3/uL (ref 0.00–0.07)
Basophils Absolute: 0 10*3/uL (ref 0.0–0.1)
Basophils Relative: 0 %
Eosinophils Absolute: 0.2 10*3/uL (ref 0.0–0.5)
Eosinophils Relative: 2 %
HCT: 41.8 % (ref 36.0–46.0)
Hemoglobin: 12 g/dL (ref 12.0–15.0)
Immature Granulocytes: 1 %
Lymphocytes Relative: 13 %
Lymphs Abs: 1.1 10*3/uL (ref 0.7–4.0)
MCH: 29.1 pg (ref 26.0–34.0)
MCHC: 28.7 g/dL — ABNORMAL LOW (ref 30.0–36.0)
MCV: 101.5 fL — ABNORMAL HIGH (ref 80.0–100.0)
Monocytes Absolute: 0.5 10*3/uL (ref 0.1–1.0)
Monocytes Relative: 6 %
Neutro Abs: 6.8 10*3/uL (ref 1.7–7.7)
Neutrophils Relative %: 78 %
Platelets: 184 10*3/uL (ref 150–400)
RBC: 4.12 MIL/uL (ref 3.87–5.11)
RDW: 14.5 % (ref 11.5–15.5)
WBC: 8.6 10*3/uL (ref 4.0–10.5)
nRBC: 0.2 % (ref 0.0–0.2)

## 2018-10-07 LAB — POCT I-STAT EG7
Acid-Base Excess: 5 mmol/L — ABNORMAL HIGH (ref 0.0–2.0)
Bicarbonate: 35.7 mmol/L — ABNORMAL HIGH (ref 20.0–28.0)
Calcium, Ion: 1.21 mmol/L (ref 1.15–1.40)
HCT: 38 % (ref 36.0–46.0)
Hemoglobin: 12.9 g/dL (ref 12.0–15.0)
O2 Saturation: 68 %
Potassium: 4.4 mmol/L (ref 3.5–5.1)
Sodium: 143 mmol/L (ref 135–145)
TCO2: 38 mmol/L — ABNORMAL HIGH (ref 22–32)
pCO2, Ven: 88.2 mmHg (ref 44.0–60.0)
pH, Ven: 7.215 — ABNORMAL LOW (ref 7.250–7.430)
pO2, Ven: 45 mmHg (ref 32.0–45.0)

## 2018-10-07 LAB — BLOOD GAS, ARTERIAL
Acid-Base Excess: 7.9 mmol/L — ABNORMAL HIGH (ref 0.0–2.0)
Bicarbonate: 36.1 mmol/L — ABNORMAL HIGH (ref 20.0–28.0)
Drawn by: 313941
O2 Content: 2 L/min
O2 Saturation: 92.9 %
Patient temperature: 98.6
pCO2 arterial: 101 mmHg (ref 32.0–48.0)
pH, Arterial: 7.179 — CL (ref 7.350–7.450)
pO2, Arterial: 73.4 mmHg — ABNORMAL LOW (ref 83.0–108.0)

## 2018-10-07 LAB — PROTIME-INR
INR: 2.3 — ABNORMAL HIGH (ref 0.8–1.2)
Prothrombin Time: 24.7 seconds — ABNORMAL HIGH (ref 11.4–15.2)

## 2018-10-07 LAB — MAGNESIUM: Magnesium: 1.9 mg/dL (ref 1.7–2.4)

## 2018-10-07 LAB — SARS CORONAVIRUS 2 BY RT PCR (HOSPITAL ORDER, PERFORMED IN ~~LOC~~ HOSPITAL LAB): SARS Coronavirus 2: NEGATIVE

## 2018-10-07 LAB — CBG MONITORING, ED: Glucose-Capillary: 117 mg/dL — ABNORMAL HIGH (ref 70–99)

## 2018-10-07 MED ORDER — PHENYLEPHRINE 40 MCG/ML (10ML) SYRINGE FOR IV PUSH (FOR BLOOD PRESSURE SUPPORT): 40.0000 ug | PREFILLED_SYRINGE | Freq: Once | INTRAVENOUS | Status: DC

## 2018-10-07 MED ORDER — VANCOMYCIN HCL 10 G IV SOLR
1500.0000 mg | INTRAVENOUS | Status: DC
  Filled 2018-10-07: qty 1500

## 2018-10-07 MED ORDER — VANCOMYCIN HCL 10 G IV SOLR
2500.0000 mg | Freq: Once | INTRAVENOUS | Status: AC
  Administered 2018-10-08: 2500 mg via INTRAVENOUS
  Filled 2018-10-07 (×2): qty 2500

## 2018-10-07 MED ORDER — SODIUM CHLORIDE 0.9 % IV SOLN
2.0000 g | Freq: Once | INTRAVENOUS | Status: AC
  Administered 2018-10-07: 2 g via INTRAVENOUS
  Filled 2018-10-07: qty 2

## 2018-10-07 NOTE — Consult Note (Signed)
NAME:  Emily Fry, MRN:  [id number], DOB:  [birth year], LOS: 0 ADMISSION DATE:  10/07/2018, CONSULTATION DATE:  4/28 REFERRING MD:  Dr. McDiarmid, CHIEF COMPLAINT:  AMS, Resp failure  Brief History   63 year old female resident of [facility] presenting with altered mental status and respiratory failure as a COVID rule out.   History of present illness   63 year old female with PMH as below, which is significant for COPD on home O2, OSA (not on CPAP), Atrial fibrillation, and HTN. She resides in [facility], where there have been documented COVID-19 cases. She has tested negative there as an outpatient. She presented to [facility] ED 4/28 with complaints of upper extremity tremor x 1 month. Also noted to have cough in ED and was mentating well upon arrival. CXR was done demonstrating possible consolidation. She was started on ABX and planned for admission to FPTS. However, her mental status worsened and ABG demonstrated severe respiratory acidosis. CT head did not describe any acute issue. PCCM consulted for further management.   Past Medical History   has a past medical history of A-fib (HCC), Allergic rhinitis, Bilateral primary osteoarthritis of knee, Chronic respiratory failure (HCC), COPD (chronic obstructive pulmonary disease) (HCC), Diastolic heart failure (HCC), GERD (gastroesophageal reflux disease), Gout, Hypertension, Obesity, and Sleep apnea.  Significant Hospital Events   4/28 admitted to [facility]   Consults:  PCCM  Procedures:    Significant Diagnostic Tests:    Micro Data:  COVID-19 4/19 > negative COVID-19 4/28 > negative Blood cx 4/28 > Urine cx 4/28 > Sputum 4/28 > RVP 4/28 > Urine strep 4/28 > Urine legionella 4/28 >  Antimicrobials:  Aztreonam 4/28 > Vancomycin 4/28 >  Interim history/subjective:  No complaints  Objective   Blood pressure (!) 116/46, pulse 84, temperature 98.7 F (37.1 C), temperature source Oral, resp. rate 15, height  (1.626 m),  weight (!) 150.6 kg, SpO2 97 %.        Intake/Output Summary (Last 24 hours) at 10/07/2018 2309 Last data filed at 10/07/2018 2226 Gross per 24 hour  Intake 100 ml  Output 450 ml  Net -350 ml   Filed Weights   10/07/18 1608  Weight: (!) 150.6 kg    Examination: General: Morbidly obese female HENT: [place]/AT, PERRL, no JVD Lungs: Clear bilateral breath sounds Cardiovascular: IRIR, no MRG Abdomen: Soft, non-tender. Obese.  Extremities: No acute deformity. No obvious edema.  Neuro: Alert, oriented x 3. Non-focal.  Snappy responses.  Marland Kitchen   Resolved Hospital Problem list     Assessment & Plan:  Acute on chronic hypercarbic and hypoxemic respiratory failure: Likely in the setting of HCAP. Respiratory acidosis on ABG seems out of proportion to neuro exam. I wonder if this is a spurious result. Has a negative COVID test from 4/19 and 4/28. Has been at [facility] so at risk exposure.  HCAP OSA not on CPAP - Admit to PCU under FPTS - ABX per FPTS - Follow cultures, RVP, COVID-19, urinary antigens.  - Supplemental O2. Decrease to home 2L to target SpO2 88-95% - Repeat ABG only if work of breathing changes/O2 desaturations/worseneing encephalopathy.   COPD without acute exacerbation (on 2L O2 outpatient). I do not see any bronchodilators on her home med list.  - No role steroids - O2 as above - Proair PRN  PCCM will follow/up in AM or sooner if needed.   Best practice:  Diet: Per primary Pain/Anxiety/Delirium protocol (if indicated): N/a VAP protocol (if indicated): N/a  DVT prophylaxis: Per primary GI prophylaxis: Per primary Glucose control: Per primary Mobility: BR Code Status: FULL Family Communication: Patient updated.  Disposition: PCU.   Labs   CBC: Recent Labs  Lab 10/07/18 1642 10/07/18 1658  WBC 8.6  --   NEUTROABS 6.8  --   HGB 12.0 12.9  HCT 41.8 38.0  MCV 101.5*  --   PLT 184  --     Basic Metabolic Panel: Recent Labs  Lab 10/07/18 1642 10/07/18 1658   NA 144 143  K 4.5 4.4  CL 101  --   CO2 35*  --   GLUCOSE 140*  --   BUN 28*  --   CREATININE 1.18*  --   CALCIUM 9.4  --   MG 1.9  --    GFR: Estimated Creatinine Clearance: 74.5 mL/min (A) (by C-G formula based on SCr of 1.18 mg/dL (H)). Recent Labs  Lab 10/07/18 1642  WBC 8.6    Liver Function Tests: Recent Labs  Lab 10/07/18 1642  AST 20  ALT 20  ALKPHOS 141*  BILITOT 0.4  PROT 6.6  ALBUMIN 3.1*   No results for input(s): LIPASE, AMYLASE in the last 168 hours. No results for input(s): AMMONIA in the last 168 hours.  ABG    Component Value Date/Time   PHART 7.179 (LL) 10/07/2018 2200   PCO2ART 101 (HH) 10/07/2018 2200   PO2ART 73.4 (L) 10/07/2018 2200   HCO3 36.1 (H) 10/07/2018 2200   TCO2 38 (H) 10/07/2018 1658   O2SAT 92.9 10/07/2018 2200     Coagulation Profile: Recent Labs  Lab 10/07/18 1642  INR 2.3*    Cardiac Enzymes: No results for input(s): CKTOTAL, CKMB, CKMBINDEX, TROPONINI in the last 168 hours.  HbA1C: Hgb A1c MFr Bld  Date/Time Value Ref Range Status  08/25/2015 02:14 AM 11.9 (H) 4.0 - 6.0 % Final    CBG: Recent Labs  Lab 10/07/18 1639  GLUCAP 117*    Review of Systems:   Bolds are positive  Constitutional: weight loss, gain, night sweats, Fevers (for years), chills (for years), fatigue .  HEENT: headaches, Sore throat, sneezing, nasal congestion, post nasal drip, Difficulty swallowing, Tooth/dental problems, visual complaints visual changes, ear ache CV:  chest pain, radiates:,Orthopnea, PND, swelling in lower extremities, dizziness, palpitations, syncope.  GI  heartburn, indigestion, abdominal pain, nausea, vomiting, diarrhea, change in bowel habits, loss of appetite, bloody stools.  Resp: cough, productive:, hemoptysis, dyspnea, chest pain, pleuritic.  Skin: rash or itching or icterus GU: dysuria, change in color of urine, urgency or frequency. flank pain, hematuria  MS: joint pain or swelling. decreased range of motion   Psych: change in mood or affect. depression or anxiety.  Neuro: difficulty with speech, weakness, numbness, ataxia    Past Medical History  She,  has a past medical history of A-fib (HCC), Allergic rhinitis, Bilateral primary osteoarthritis of knee, Chronic respiratory failure (HCC), COPD (chronic obstructive pulmonary disease) (HCC), Diastolic heart failure (HCC), GERD (gastroesophageal reflux disease), Gout, Hypertension, Obesity, and Sleep apnea.   Surgical History    Past Surgical History:  Procedure Laterality Date  . TONSILLECTOMY       Social History   reports that she has quit smoking. She has never used smokeless tobacco. She reports that she does not drink alcohol or use drugs.   Family History   Her family history includes Cancer in her father and mother; Lung cancer in her father; Throat cancer in her mother.   Allergies Allergies  Allergen Reactions  . Contrast Media [Iodinated Diagnostic Agents] Swelling  . Tomato Other (See Comments)    unknow per MAR  . Amoxicillin Diarrhea, Itching and Other (See Comments)    Has patient had a PCN reaction causing immediate rash, facial/tongue/throat swelling, SOB or lightheadedness with hypotension: No Has patient had a PCN reaction causing severe rash involving mucus membranes or skin necrosis: No Has patient had a PCN reaction that required hospitalization: No Has patient had a PCN reaction occurring within the last 10 years: Yes If all of the above answers are "NO", then may proceed with Cephalosporin use.      Home Medications  Prior to Admission medications   Medication Sig Start Date End Date Taking? Authorizing Provider  acetaminophen (TYLENOL) 325 MG tablet Take 650 mg by mouth daily.   Yes [provider]  acetaminophen (TYLENOL) 325 MG tablet Take 650 mg by mouth every 4 (four) hours as needed for fever.   Yes [provider]  acetaZOLAMIDE (DIAMOX) 250 MG tablet Take 250 mg by mouth 2 (two) times  daily. 02/07/18  Yes [provider]  aspirin EC 81 MG tablet Take 81 mg by mouth daily.   Yes [provider]  CARTIA XT 240 MG 24 hr capsule Take 240 mg by mouth at bedtime. 07/27/15  Yes [provider]  Cholecalciferol 25 MCG (1000 UT) tablet Take 2,000 Units by mouth daily.   Yes [provider]  Colchicine 0.6 MG CAPS Take 0.6 mg by mouth daily as needed (gout).  04/04/18  Yes [provider]  diclofenac sodium (VOLTAREN) 1 % GEL Apply 2 g topically every 12 (twelve) hours as needed (for gout apply bilateral knees for pain).   Yes [provider]  febuxostat (ULORIC) 40 MG tablet Take 40 mg by mouth daily.    Yes [provider]  furosemide (LASIX) 80 MG tablet Take 80 mg by mouth daily. afternoon   Yes [provider]  Insulin Glargine (LANTUS) 100 UNIT/ML Solostar Pen Inject 45 Units into the skin daily at 10 pm. Patient taking differently: Inject 10 Units into the skin at bedtime.  08/28/15  Yes Katharina Caper, MD  metoprolol tartrate (LOPRESSOR) 25 MG tablet Take 25 mg by mouth at bedtime.  07/25/15  Yes [provider]  nystatin (MYCOSTATIN/NYSTOP) powder Apply topically every 12 (twelve) hours as needed (yeast). Under breast   Yes [provider]  pantoprazole (PROTONIX) 40 MG tablet Take 40 mg by mouth daily. 08/18/15  Yes [provider]  potassium chloride SA (K-DUR,KLOR-CON) 20 MEQ tablet Take 60 mEq by mouth daily.  03/17/18  Yes [provider]  senna (SENOKOT) 8.6 MG tablet Take 2 tablets by mouth daily as needed for constipation.   Yes [provider]  traMADol (ULTRAM) 50 MG tablet Take 50 mg by mouth every 6 (six) hours as needed for moderate pain.   Yes [provider]  TRULICITY 1.5 MG/0.5ML SOPN Inject 1.5 mg into the skin once a week. On saturday 03/23/18  Yes [provider]  warfarin (COUMADIN) 5 MG tablet Take 5 mg by mouth See admin  instructions. Take 1 tablet by mouth at bedtime every Tuesday, Thursday, Saturday and Sunday 10/05/18  Yes [provider]  warfarin (COUMADIN) 6 MG tablet Take 6 mg by mouth See admin instructions. Take 1 tablet by mouth at bedtime every Monday, Wednesday, Friday for Afib for 2 weeks. 10/06/18  Yes [provider]  isosorbide mononitrate (IMDUR)  30 MG 24 hr tablet Take 30 mg by mouth daily.    [provider]     Critical care time:      Joneen Roach, AGACNP-BC [facility]/Critical Care Pager [phone] or [phone]  10/08/2018 12:05 AM

## 2018-10-07 NOTE — ED Notes (Signed)
Notified pharmacy I still have not received 2,500mg  bag of vancomycin for patient due at 21:15

## 2018-10-07 NOTE — ED Notes (Signed)
Purewick placed. Pt changed of soiled clothing.

## 2018-10-07 NOTE — H&P (Addendum)
Family Medicine Teaching [facility] Admission History and Physical Service Pager: [phone]  Patient name: Emily Fry Medical record number: [id number] Date of birth: [birth year] Age: 63 y.o. Gender: female  Primary Care Provider: Lauro Regulus, MD Consultants: Gentry Fitz admission  Code Status: Full Code (confirmed by [facility] and over the telephone with sister) Preferred Emergency Contact : Tarra Bowermaster (sister) [phone]    Chief Complaint: hand twitching   Assessment and Plan: Emily Fry is a 63 y.o. female admitted for tremor, cough, from [facility] where patient has lived for the last year. Her chronic conditions include DM II, OSA, obesity, HTN, Gout, COPD w/ chronic respiratory failure (on 2L O2), and a fib on Coumadin.   #Altered Mental Status #New BL Upper Extremity Tremors Patient presents with altered mental status described as decreased responsiveness, abnormal eye movements, and bilateral upper extremity tremors.  The symptoms developed acutely this evening.  Patient's risk factors for altered mental status include A. fib on Coumadin, chronic hypercarbic respiratory failure, new left-sided pneumonia and UTI given patient's limited mobility.  Given unusual eye movements + jerking movements can consider seizure with post ictal state, if further w/u is negative can consider seizure w/u and neurology consult. Acute hemorrhagic and ischemic stroke ruled out with CT without on admission which showed no acute abnormalities.  On arrival to the ED, stat VBG was notable for pH 7.215, PCO2 88.2, bicarb 35.  Repeat ABG returned at pH 7.17, PCO2 101, PO2 73, bicarb 36.1.  This finding makes hypercapnic respiratory failure the most likely cause of patient's altered mental status.  Likely due to baseline chronic respiratory failure wit with COPD exacerbated by new onset left-sided pneumonia.  In the ED, labs were otherwise significant for white count 8.6, hemoglobin  12.0, creatinine 1.18, BUN 28 with otherwise normal chemistries and electrolytes. Patient was afebrile with stable RR. Patient's COVID-19 rapid testing in the ED was negative however, patient remains person under interest for COVID-19 as she :1) arrives from a nursing facility with active COVID positive patient's, 2) unclear sensitivity and specificity of rapid testing, with known previous false negatives, and 3) despite patient's decreased responsiveness, she is able to voice that she has a new onset cough and documented respiratory decline given worsening ABG.  COVID biomarkers and supportive labs were ordered while patient was still in the ED.  As patient remains PUI, we will be unable to give her BiPAP for hypercapnea at this time.  CCM was consulted for intubation in the setting of patient's worsening ABG. Spoke to Dr. Marchelle Gearing directly who stated he will evaluate patient and determine if intubation is necessary.  . Admit to FMTS, attending Dr. McDiarmid. Level of care: Progressive vs ICU.  Marland Kitchen Vitals per unit routine, continuous O2, continuous pulse ox . Continuous cardiac monitoring . Droplet and contact precautions  . F/u COVID biomarkers and supportive labs  . Follow-up CCM and ED attending recommendations . Neuro checks every 4 hours . Update patient's emergency contact patient admitted to floor . AMS w/u including TSH, ammonia, and UDS . Urinalysis and urine culture  . Can consider neurology consult   # COPD # Chronic Respiratory Failure  #HCAP On 2 L of oxygen at baseline, unclear if this is all day or just qhs.  Patient does not have any controller medications on her medication list. Worsening respiratory status likely 2/2 new pneumonia, patient in healthcare facility ([facility]). S/p aztrenoam and vancomycin in ED.   Continuous O2  Continuous pulse ox  vanc (4/28-) and cefepime (4/29-) per pharm  #HFpEF # HTN Last EF listed in 2010 was >55% at [facility]. At home, patient on Lasix 80  mg, metoprolol 25 mg, Imdur 30 mg, Cartia XT 240 mg.  Continue home metoprolol and Cartia for continued rate control  Holding Imdur and Lasix as pt is normotensive/bordlerine hypotensive  #A fib Patient's heart rate in 80s.  EKG was not yet obtained in the ED.  On Coumadin at home.  INR 2.3 in ED.  Continue Cartia and metoprolol for rate control  Coumadin per pharmacy  Follow-up EKG  Daily EKG  Continuous cardiac monitoring  #CAD #Hx of MI  Continue home ASA  # OSA Does not wear CPAP at home, but does wear O2 at night.    Continue night O2  # Gout At home, on februxustat scheduled and PRN colchicine    Discontinue daily febuxostat given new black box warning  # OA BL Knees At home, tramadol  q 6 hours PRN pain    Continue tramadol 50 mg every 6 hours as needed pain  Hold tramadol if patient with altered mental status  #DM II On Lantus 45 units and Trulicity at home q Saturday, but did refuse this last Saturday per SNF due to swelling in her right abdomen as well as right breast, which she thought was due to trulicity.  Glucose on admission 140.  As patient is n.p.o., will do moderate sliding scale and CBG every 4 hours.  CBG every 4 hours  Moderate sliding scale 3 times daily  No basal insulin  Titrate insulins when advancing diet  # CKD 3 Cr 1.18. Baseline GFR is 50s and creatinine 1.2.  Patient at baseline kidney function  Hold nephrotoxic medications  #Morbid Obesity, BMI 56.99 Limited mobility   OOB with assistance only   Can consider PT/OT with improvement in respiratory status  #FEN/GI:  . Fluids: KVO . Electrolytes: Within normal limits . Nutrition: N.p.o..  Access: L PIV VTE prophylaxis: Coumadin  Disposition: Admit to progressive with continuous cardiac monitoring.  ============================================================================= HPI [name] is a 63 y.o. female with past medical history significant for A. fib  on Coumadin, chronic diastolic heart failure, COPD, chronic respiratory failure on 2 L of oxygen at baseline, gout, CKD 3, OSA, CAD, history of MI, who presents with altered mental status.  Due to patient's mental status, she is unable to provide much valuable information about presenting illness. She is able to express that she has a new cough that just started.  Was able to call nursing home and speak to nurse provider who reports that at baseline, patient is alert and oriented x4, though due to her morbid obesity, she has limited mobility.  Per transfer note from nursing facility, patient was having jerking movements and not acting right.  She continued to have involuntary jerking and had a blank stare when her name was called.  She would only respond after several seconds to her name.  There was also note of her eyes moving in usual manner.  During this time, vital signs remained within normal limits. Patient last COVID test was negative.  The nursing home does have positive covered cases at this time.  Was also able to get ancillary information from sister, Guillermo Difrancesco.  She reports that patient does have some confusion at baseline which is mostly seen when patient does not answer questions immediately.  For example, if Annice Pih asks her what she had for breakfast this morning, patient  will pause for several seconds before answering the question.  But she will answer the question eventually.  Sister also reports that she has been previously hospitalized with confusion and does remember that she had a high carbon dioxide in her blood at that time.  Sister also reports that she does not wear her CPAP at night but does wear 2 L of oxygen which is her baseline.  Sister reports that she has not seen patient in over 4 weeks due to visiting restrictions secondary to COVID-19.  In the ED, Initial stat VBG showed pH 7.215, CO2 88.2, PO2 45.0, bicarb 35.7.  CT head without contrast was negative for any acute  abnormalities.  Afebrile. Chest x-ray was significant for new infiltrate at left lung base with minimal atelectasis in the right upper lobe.  Rapid COVID test was negative.  Patient was started on IV aztreonam 2 g. Other abnormal labs listed below.   Abnormal Labs Reviewed  CBC WITH DIFFERENTIAL/PLATELET - Abnormal; Notable for the following components:      Result Value   MCV 101.5 (*)    MCHC 28.7 (*)    All other components within normal limits  COMPREHENSIVE METABOLIC PANEL - Abnormal; Notable for the following components:   CO2 35 (*)    Glucose, Bld 140 (*)    BUN 28 (*)    Creatinine, Ser 1.18 (*)    Albumin 3.1 (*)    Alkaline Phosphatase 141 (*)    GFR calc non Af Amer 50 (*)    GFR calc Af Amer 58 (*)    All other components within normal limits  PROTIME-INR - Abnormal; Notable for the following components:   Prothrombin Time 24.7 (*)    INR 2.3 (*)    All other components within normal limits  BLOOD GAS, ARTERIAL - Abnormal; Notable for the following components:   pH, Arterial 7.179 (*)    pCO2 arterial 101 (*)    pO2, Arterial 73.4 (*)    Bicarbonate 36.1 (*)    Acid-Base Excess 7.9 (*)    All other components within normal limits  CBG MONITORING, ED - Abnormal; Notable for the following components:   Glucose-Capillary 117 (*)    All other components within normal limits  POCT I-STAT EG7 - Abnormal; Notable for the following components:   pH, Ven 7.215 (*)    pCO2, Ven 88.2 (*)    Bicarbonate 35.7 (*)    TCO2 38 (*)    Acid-Base Excess 5.0 (*)    All other components within normal limits   Review Of Systems: Review of Systems  Unable to perform ROS: Mental acuity   Patient Active Problem List   Diagnosis Date Noted  . Altered mental state 10/07/2018  . A-fib (HCC) 10/07/2018  . COPD (chronic obstructive pulmonary disease) (HCC) 10/07/2018  . Gout 10/07/2018  . Benign essential HTN 10/07/2018  . OSA (obstructive sleep apnea) 10/07/2018  . Diastolic heart  failure (HCC) 40/98/1191  . GERD (gastroesophageal reflux disease) 10/07/2018  . Hypoxia 04/14/2018  . Hyponatremia 08/28/2015  . Hypokalemia 08/28/2015  . 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 A-fib (HCC)   . Chronic respiratory failure (HCC)    on 2L home o2  . COPD (chronic obstructive pulmonary disease) (HCC)   . Gout   . Hypertension   . Obesity   . Sleep apnea  Past Surgical History: Past Surgical History:  Procedure Laterality Date  . TONSILLECTOMY     Family History: family history includes Cancer in her father and mother; Lung cancer in her father; Throat cancer in her mother.   Social History:  Miyani reports that she has quit smoking. She has never used smokeless tobacco. She reports that she does not drink alcohol or use drugs.  Allergies and Medications: Allergies  Allergen Reactions  . Contrast Media [Iodinated Diagnostic Agents] Swelling  . Tomato Other (See Comments)    unknow per MAR  . Amoxicillin Diarrhea, Itching and Other (See Comments)    Has patient had a PCN reaction causing immediate rash, facial/tongue/throat swelling, SOB or lightheadedness with hypotension: No Has patient had a PCN reaction causing severe rash involving mucus membranes or skin necrosis: No Has patient had a PCN reaction that required hospitalization: No Has patient had a PCN reaction occurring within the last 10 years: Yes If all of the above answers are "NO", then may proceed with Cephalosporin use.    Current Meds  Medication Sig  . acetaminophen (TYLENOL) 325 MG tablet Take 650 mg by mouth daily.  Marland Kitchen acetaminophen (TYLENOL) 325 MG tablet Take 650 mg by mouth every 4 (four) hours as needed for fever.  Marland Kitchen acetaZOLAMIDE (DIAMOX) 250 MG tablet Take 250 mg by mouth 2 (two) times daily.  Marland Kitchen aspirin EC 81 MG tablet Take 81 mg by  mouth daily.  Marland Kitchen CARTIA XT 240 MG 24 hr capsule Take 240 mg by mouth at bedtime.  . Cholecalciferol 25 MCG (1000 UT) tablet Take 2,000 Units by mouth daily.  . Colchicine 0.6 MG CAPS Take 0.6 mg by mouth daily as needed (gout).   Marland Kitchen diclofenac sodium (VOLTAREN) 1 % GEL Apply 2 g topically every 12 (twelve) hours as needed (for gout apply bilateral knees for pain).  . febuxostat (ULORIC) 40 MG tablet Take 40 mg by mouth daily.   . furosemide (LASIX) 80 MG tablet Take 80 mg by mouth daily. afternoon  . Insulin Glargine (LANTUS) 100 UNIT/ML Solostar Pen Inject 45 Units into the skin daily at 10 pm. (Patient taking differently: Inject 10 Units into the skin at bedtime. )  . metoprolol tartrate (LOPRESSOR) 25 MG tablet Take 25 mg by mouth at bedtime.   Marland Kitchen nystatin (MYCOSTATIN/NYSTOP) powder Apply topically every 12 (twelve) hours as needed (yeast). Under breast  . pantoprazole (PROTONIX) 40 MG tablet Take 40 mg by mouth daily.  . potassium chloride SA (K-DUR,KLOR-CON) 20 MEQ tablet Take 60 mEq by mouth daily.   Marland Kitchen senna (SENOKOT) 8.6 MG tablet Take 2 tablets by mouth daily as needed for constipation.  . traMADol (ULTRAM) 50 MG tablet Take 50 mg by mouth every 6 (six) hours as needed for moderate pain.  . TRULICITY 1.5 MG/0.5ML SOPN Inject 1.5 mg into the skin once a week. On saturday  . warfarin (COUMADIN) 5 MG tablet Take 5 mg by mouth See admin instructions. Take 1 tablet by mouth at bedtime every Tuesday, Thursday, Saturday and Sunday  . warfarin (COUMADIN) 6 MG tablet Take 6 mg by mouth See admin instructions. Take 1 tablet by mouth at bedtime every Monday, Wednesday, Friday for Afib for 2 weeks.    Objective: BP (!) 116/46   Pulse 84   Temp 98.7 F (37.1 C) (Oral)   Resp 15   Ht 5\' 4"  (1.626 m)   Wt (!) 150.6 kg   SpO2 97%   BMI 56.99 kg/m  Exam: Physical Exam Constitutional:      Appearance: She is obese.     Comments: Oriented to place and time, initially not oriented to person but  was able to state "Marylu LundJanet" after several attempts Eyes wide, unable to follow commands, only answering some questions  HENT:     Head: Normocephalic.     Nose: Nose normal.     Mouth/Throat:     Mouth: Mucous membranes are dry.  Eyes:     Conjunctiva/sclera: Conjunctivae normal.     Pupils: Pupils are equal, round, and reactive to light.     Comments: Unable to determine EOM as patient did not follow commands  Neck:     Musculoskeletal: Neck supple. No muscular tenderness.  Cardiovascular:     Rate and Rhythm: Normal rate and regular rhythm.     Heart sounds: No murmur.  Pulmonary:     Breath sounds: No wheezing or rales.     Comments: Difficult exam 2/2 body habitus, taking deep breaths Abdominal:     General: Bowel sounds are normal. There is no distension.     Tenderness: There is no abdominal tenderness.  Musculoskeletal:        General: No swelling or tenderness.     Comments: Unable to follow all commands, normal grip strength, 3/5 muscle strength in lower extremities, would not lift upper extremities   Skin:    General: Skin is warm.  Neurological:     Mental Status: She is disoriented.     Comments: Difficult to assess as patient  Unable to follow commands. Uvula midline.        Labs and Imaging: I have personally reviewed following labs and imaging studies CBC: Recent Labs  Lab 10/07/18 1642 10/07/18 1658  WBC 8.6  --   NEUTROABS 6.8  --   HGB 12.0 12.9  HCT 41.8 38.0  MCV 101.5*  --   PLT 184  --    CMP: Recent Labs  Lab 10/07/18 1642 10/07/18 1658  NA 144 143  K 4.5 4.4  CL 101  --   CO2 35*  --   GLUCOSE 140*  --   BUN 28*  --   CREATININE 1.18*  --   CALCIUM 9.4  --   MG 1.9  --   ALBUMIN 3.1*  --      GFR: Estimated Creatinine Clearance: 74.5 mL/min (A) (by C-G formula based on SCr of 1.18 mg/dL (H)).  Liver Function Tests: Recent Labs  Lab 10/07/18 1642  AST 20  ALT 20  ALKPHOS 141*  BILITOT 0.4  PROT 6.6  ALBUMIN 3.1*    Coagulation Profile: Recent Labs  Lab 10/07/18 1642  INR 2.3*   CBG: Recent Labs  Lab 10/07/18 1639  GLUCAP 117*   Recent Labs    10/07/18 1658 10/07/18 2200  PHVEN 7.215*  --   PCO2VEN 88.2*  --   HCO3 35.7* 36.1*     Imaging/Diagnostic Tests: Ct Head Wo Contrast Result Date: 10/07/2018 IMPRESSION: No acute intracranial abnormality Exophthalmos.   Dg Chest Portable 1 View Result Date: 10/07/2018 IMPRESSION: Area of new infiltrate at the left lung base. Minimal atelectasis in the right upper lobe.   Melene PlanKim, Rachel E, M.D. 10/07/2018, 10:34 PM PGY-1, [facility] FPTS Intern pager: [phone], text pages welcome   FPTS Upper-Level Resident Addendum   I have independently interviewed and examined the patient. I have discussed the above with the original author and agree with their documentation. My  edits for correction/addition/clarification are in blue. Please see also any attending notes.   Oralia Manis, DO PGY-2, [facility] 10/07/2018 11:51 PM  FPTS Service pager: [phone] (text pages welcome through AMION)

## 2018-10-07 NOTE — Progress Notes (Signed)
Critical ABG results called to Dr. Selena Batten. Patient coming from a nursing home with several COVID positive patients, therefore, patient is under investigation to verify COVID results with a send out test per MD. RN made aware. Will continue to monitor patient.

## 2018-10-07 NOTE — Progress Notes (Signed)
Pharmacy Antibiotic Note  Emily Fry is a 63 y.o. female admitted on 10/07/2018 with pneumonia.  Outpatient neg for covid and repeat in house neg  Plan: Aztreonam x 1 Vanc 2500 mg x 1 then  mg q24h Monitor renal fx cx vanc lvls prn F/u additional gram neg coverage  Height: 5\' 4"  (162.6 cm) Weight: (!) 332 lb (150.6 kg) IBW/kg (Calculated) : 54.7  Temp (24hrs), Avg:98.7 F (37.1 C), Min:98.7 F (37.1 C), Max:98.7 F (37.1 C)  Recent Labs  Lab 10/07/18 1642  WBC 8.6  CREATININE 1.18*    Estimated Creatinine Clearance: 74.5 mL/min (A) (by C-G formula based on SCr of 1.18 mg/dL (H)).   Vancomycin 1500 mg IV Q 24 hrs. Goal AUC 400-550. Expected AUC: 490 SCr used: 1.18  Isaac Bliss, PharmD, BCPS, BCCCP Clinical Pharmacist [phone]  Please check AMION for all [facility] numbers  10/07/2018 9:06 PM

## 2018-10-07 NOTE — ED Triage Notes (Signed)
Pt arrives via EMS from [facility] with reports of bilateral hand twitching. Also reports episodes where she is talking then would go out for a few seconds. Reports a negative covid test from the 19th. Pt had chest xray this AM due to cough when the twitching was noted. Pt reports she has had the twitching for about a month and has reported it to staff.

## 2018-10-07 NOTE — ED Notes (Signed)
Called patients sister Annice Pih and updated her over the phone, patient plan to be admitted to the hospital. Annice Pih thankful for the update.

## 2018-10-07 NOTE — ED Notes (Signed)
This RN spoke with pt and asked if I could updated any family/friends concerning medical info. Pt gave permission for this RN to speak with her sister, Annice Pih. Annice Pih has been updated and was very thankful for the information.

## 2018-10-07 NOTE — ED Provider Notes (Addendum)
[facility] EMERGENCY DEPARTMENT Provider Note   CSN: [id number] Arrival date & time: 10/07/18  1603    History   Chief Complaint Chief Complaint  Patient presents with   hand twitching    HPI Emily Fry is a 63 y.o. female.     HPI Patient presents to the emergency room for evaluation of tremors.  Patient is a resident of a nursing facility.  Patient states she has been there for about a year.  Patient states she has had intermittent tremors for maybe a month where she started having symptoms today.  Staff at the facility noticed her arms shaking and sent her to the ED for further evaluation.  Patient states the tremors only involve her upper extremities.  She denies any weakness in her lower extremities.  Patient denies any headache.  No fevers or chills.  According to the nursing home notes the patient has also had a cough.  They recently did a chest x-ray and she had a negative outpatient COVID  Test. 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      Home Medications    Prior to Admission medications   Medication Sig Start Date End Date Taking? Authorizing Provider  acetaZOLAMIDE (DIAMOX) 250 MG tablet Take 250 mg by mouth 2 (two) times daily. 02/07/18   [provider]  aspirin EC 81 MG tablet Take 81 mg by mouth daily.    [provider]  CARTIA XT 240 MG 24 hr capsule Take  240 mg by mouth at bedtime. 07/27/15   [provider]  colchicine 0.6 MG tablet Take 0.6 mg by mouth 2 (two) times daily. 04/04/18   [provider]  Febuxostat 80 MG TABS Take 80 mg by mouth daily. 03/15/18   [provider]  furosemide (LASIX) 80 MG tablet Take 80 mg by mouth at bedtime. 03/24/18   [provider]  gabapentin (NEURONTIN) 100 MG capsule Take 100 mg by mouth 2 (two) times daily. 03/25/18   [provider]  Insulin Glargine (LANTUS) 100 UNIT/ML Solostar Pen Inject 45 Units into the skin daily at 10 pm. Patient taking differently: Inject 10 Units into the skin daily at 10 pm.  08/28/15   Katharina Caper, MD  magnesium oxide (MAG-OX) 400 (241.3 Mg) MG tablet Take 1 tablet (400 mg total) by mouth daily. 04/16/18   Altamese Dilling, MD  metoprolol tartrate (LOPRESSOR) 25 MG tablet Take 25 mg by mouth at bedtime.  07/25/15   [provider]  pantoprazole (PROTONIX) 40 MG tablet Take 40 mg by mouth daily. 08/18/15   [provider]  potassium chloride SA (K-DUR,KLOR-CON) 20 MEQ tablet Take 60 mEq by mouth at bedtime.  03/17/18   [provider]  simvastatin (ZOCOR) 40 MG tablet Take 40 mg by mouth at bedtime.  06/17/15  [provider]  TRULICITY 1.5 MG/0.5ML SOPN Inject 1.5 mg into the skin once a week. On saturday 03/23/18   [provider]  warfarin (COUMADIN) 6 MG tablet Take 1 tablet (6 mg total) by mouth daily at 6 PM for 10 doses. Need to check INR in 4-5 days. 04/16/18 04/26/18  Altamese Dilling, MD    Family History Family History  Problem Relation Age of Onset   Cancer Mother        deceased   Throat cancer Mother    Cancer Father        deceased   Lung cancer Father     Social History Social History   Tobacco Use   Smoking status: Former Smoker   Smokeless tobacco: Never Used   Tobacco comment: stopped 9 years ago  Substance Use Topics   Alcohol use: No     Alcohol/week: 0.0 standard drinks   Drug use: No     Allergies   Contrast media [iodinated diagnostic agents] and Amoxicillin   Review of Systems Review of Systems  All other systems reviewed and are negative.    Physical Exam Updated Vital Signs BP (!) 149/102 (BP Location: Right Wrist)    Pulse 93    Temp 98.7 F (37.1 C) (Oral)    Resp 16    Ht 1.626 m (5\' 4" )    Wt (!) 150.6 kg    SpO2 93%    BMI 56.99 kg/m   Physical Exam Vitals signs and nursing note reviewed.  Constitutional:      General: She is not in acute distress.    Appearance: She is well-developed. She is obese.     Comments: Morbidly obese  HENT:     Head: Normocephalic and atraumatic.     Right Ear: External ear normal.     Left Ear: External ear normal.  Eyes:     General: No scleral icterus.       Right eye: No discharge.        Left eye: No discharge.     Conjunctiva/sclera: Conjunctivae normal.  Neck:     Musculoskeletal: Neck supple.     Trachea: No tracheal deviation.  Cardiovascular:     Rate and Rhythm: Normal rate and regular rhythm.  Pulmonary:     Effort: Pulmonary effort is normal. No respiratory distress.     Breath sounds: Normal breath sounds. No stridor. No wheezing or rales.  Abdominal:     General: Bowel sounds are normal. There is no distension.     Palpations: Abdomen is soft.     Tenderness: There is no abdominal tenderness. There is no guarding or rebound.  Musculoskeletal:        General: No tenderness.  Skin:    General: Skin is warm and dry.     Findings: No rash.  Neurological:     Mental Status: She is alert.     GCS: GCS eye subscore is 4. GCS verbal subscore is 5. GCS motor subscore is 6.     Cranial Nerves: No cranial nerve deficit (no facial droop, extraocular movements intact, no slurred speech).     Sensory: No sensory deficit.     Motor: Weakness and tremor present. No abnormal muscle tone or seizure activity.     Coordination: Coordination normal.      Comments: Generalized weakness, patient has difficulty lifting her legs off the bed, she does states she normally is bedridden and has not been able to get out  of the bed for a while.  She is able to lift both arms but does have a tremor at rest as well as with movement      ED Treatments / Results  Labs (all labs ordered are listed, but only abnormal results are displayed) Labs Reviewed  CBC WITH DIFFERENTIAL/PLATELET - Abnormal; Notable for the following components:      Result Value   MCV 101.5 (*)    MCHC 28.7 (*)    All other components within normal limits  COMPREHENSIVE METABOLIC PANEL - Abnormal; Notable for the following components:   CO2 35 (*)    Glucose, Bld 140 (*)    BUN 28 (*)    Creatinine, Ser 1.18 (*)    Albumin 3.1 (*)    Alkaline Phosphatase 141 (*)    GFR calc non Af Amer 50 (*)    GFR calc Af Amer 58 (*)    All other components within normal limits  PROTIME-INR - Abnormal; Notable for the following components:   Prothrombin Time 24.7 (*)    INR 2.3 (*)    All other components within normal limits  CBG MONITORING, ED - Abnormal; Notable for the following components:   Glucose-Capillary 117 (*)    All other components within normal limits  POCT I-STAT EG7 - Abnormal; Notable for the following components:   pH, Ven 7.215 (*)    pCO2, Ven 88.2 (*)    Bicarbonate 35.7 (*)    TCO2 38 (*)    Acid-Base Excess 5.0 (*)    All other components within normal limits  MAGNESIUM  I-STAT VENOUS BLOOD GAS, ED    EKG None  Radiology Ct Head Wo Contrast  Result Date: 10/07/2018 CLINICAL DATA:  Seizure.  Bilateral hand twitching EXAM: CT HEAD WITHOUT CONTRAST TECHNIQUE: Contiguous axial images were obtained from the base of the skull through the vertex without intravenous contrast. COMPARISON:  None. FINDINGS: Brain: Ventricle size and cerebral volume normal. Negative for acute infarct. Negative for hemorrhage or mass. Vascular: Negative for hyperdense vessel Skull:  Negative Sinuses/Orbits: Bilateral exophthalmos. No orbital mass. Paranasal sinuses clear. Other: None IMPRESSION: No acute intracranial abnormality Exophthalmos. Electronically Signed   By: Marlan Palau M.D.   On: 10/07/2018 17:10   Dg Chest Portable 1 View  Result Date: 10/07/2018 CLINICAL DATA:  Tremor.  Cough. EXAM: PORTABLE CHEST 1 VIEW COMPARISON:  04/14/2018 and 05/17/2008 FINDINGS: There is abnormal increased density at the left lung base, new since the prior study. Heart size and pulmonary vascularity are normal. There is a new small focal area of linear atelectasis in the right upper lobe. No effusions. IMPRESSION: Area of new infiltrate at the left lung base. Minimal atelectasis in the right upper lobe. Electronically Signed   By: Francene Boyers M.D.   On: 10/07/2018 17:14    Procedures Procedures (including critical care time)  Medications Ordered in ED Medications - No data to display   Initial Impression / Assessment and Plan / ED Course  I have reviewed the triage vital signs and the nursing notes.  Pertinent labs & imaging results that were available during my care of the patient were reviewed by me and considered in my medical decision making (see chart for details).  Clinical Course as of Oct 07 1822  Tue Oct 07, 2018  1728 Labs show a slight decrease in the patient's venous pH.  She has an elevated in his PCO2 and bicarb level.  This is consistent with a chronic respiratory  acidosis and compensatory metabolic alkalosis.   [JK]  1728 CHest x-ray does show possible new pneumonia.   [JK]  1729 Will start pt on abx.  Admit for further treatment   [JK]  1813 Reviewed outpatient labs.  Covid negative on 4/19.      [JK]    Clinical Course User Index [JK] Linwood Dibbles, MD     Patient's primary complaint was tremulousness.  This seemed to be global tremors of her upper extremities.  The patient is also having some generalized weakness.  Patient has been coughing.  She does  have chronic lung disease and is normally on oxygen.  Chest x-ray shows the possibility of pneumonia.  Patient is afebrile here he does not have a leukocytosis but infection is a concern.  She did have a recent covid test that was negative.  Patient's ABG is consistent with chronic respiratory acidosis although there may be an acute component with her slight decrease in her pH.  She is still mentating well.  I think is reasonable start her on antibiotics and bring her in for overnight observation.  Lively Behar was evaluated in Emergency Department on 10/07/2018 for the symptoms described in the history of present illness. She was evaluated in the context of the global COVID-19 pandemic, which necessitated consideration that the patient might be at risk for infection with the SARS-CoV-2 virus that causes COVID-19. Institutional protocols and algorithms that pertain to the evaluation of patients at risk for COVID-19 are in a state of rapid change based on information released by regulatory bodies including the CDC and federal and state organizations. These policies and algorithms were followed during the patient's care in the ED.  Final Clinical Impressions(s) / ED Diagnoses   Final diagnoses:  HCAP (healthcare-associated pneumonia)  Tremor      Linwood Dibbles, MD 10/07/18 1831  Notified by FP service.  ABG is worse.  Pt has had two negative Covid tests but she is being admitted as a PUI.  Pt is still awake and alert. She is requesting something to eat.  I think BIPAP would be a reasonable intervention rather than intubation.  Per policy, pt cannot be on BIPAP as she is a PUI.  I will try contact critical care service.   Linwood Dibbles, MD 10/07/18 2320

## 2018-10-08 ENCOUNTER — Other Ambulatory Visit: Payer: Self-pay

## 2018-10-08 ENCOUNTER — Encounter (HOSPITAL_COMMUNITY): Payer: Self-pay

## 2018-10-08 DIAGNOSIS — J411 Mucopurulent chronic bronchitis: Secondary | ICD-10-CM

## 2018-10-08 DIAGNOSIS — R400 Somnolence: Secondary | ICD-10-CM

## 2018-10-08 DIAGNOSIS — I4891 Unspecified atrial fibrillation: Secondary | ICD-10-CM

## 2018-10-08 DIAGNOSIS — G4733 Obstructive sleep apnea (adult) (pediatric): Secondary | ICD-10-CM

## 2018-10-08 DIAGNOSIS — J181 Lobar pneumonia, unspecified organism: Secondary | ICD-10-CM

## 2018-10-08 DIAGNOSIS — E872 Acidosis: Secondary | ICD-10-CM

## 2018-10-08 DIAGNOSIS — R0689 Other abnormalities of breathing: Secondary | ICD-10-CM

## 2018-10-08 DIAGNOSIS — J9611 Chronic respiratory failure with hypoxia: Secondary | ICD-10-CM

## 2018-10-08 DIAGNOSIS — M1A09X1 Idiopathic chronic gout, multiple sites, with tophus (tophi): Secondary | ICD-10-CM

## 2018-10-08 LAB — CBC WITH DIFFERENTIAL/PLATELET
Abs Immature Granulocytes: 0.03 10*3/uL (ref 0.00–0.07)
Basophils Absolute: 0 10*3/uL (ref 0.0–0.1)
Basophils Relative: 0 %
Eosinophils Absolute: 0.2 10*3/uL (ref 0.0–0.5)
Eosinophils Relative: 3 %
HCT: 38.9 % (ref 36.0–46.0)
Hemoglobin: 10.9 g/dL — ABNORMAL LOW (ref 12.0–15.0)
Immature Granulocytes: 0 %
Lymphocytes Relative: 14 %
Lymphs Abs: 1 10*3/uL (ref 0.7–4.0)
MCH: 27.9 pg (ref 26.0–34.0)
MCHC: 28 g/dL — ABNORMAL LOW (ref 30.0–36.0)
MCV: 99.7 fL (ref 80.0–100.0)
Monocytes Absolute: 0.5 10*3/uL (ref 0.1–1.0)
Monocytes Relative: 7 %
Neutro Abs: 5 10*3/uL (ref 1.7–7.7)
Neutrophils Relative %: 76 %
Platelets: 149 10*3/uL — ABNORMAL LOW (ref 150–400)
RBC: 3.9 MIL/uL (ref 3.87–5.11)
RDW: 14.6 % (ref 11.5–15.5)
WBC: 6.8 10*3/uL (ref 4.0–10.5)
nRBC: 0 % (ref 0.0–0.2)

## 2018-10-08 LAB — TROPONIN I: Troponin I: <0.03 ng/mL (ref <0.03)

## 2018-10-08 LAB — GLUCOSE, CAPILLARY
Glucose-Capillary: 105 mg/dL — ABNORMAL HIGH (ref 70–99)
Glucose-Capillary: 100 mg/dL — ABNORMAL HIGH (ref 70–99)
Glucose-Capillary: 105 mg/dL — ABNORMAL HIGH (ref 70–99)
Glucose-Capillary: 108 mg/dL — ABNORMAL HIGH (ref 70–99)
Glucose-Capillary: 105 mg/dL — ABNORMAL HIGH (ref 70–99)
Glucose-Capillary: 97 mg/dL (ref 70–99)

## 2018-10-08 LAB — COMPREHENSIVE METABOLIC PANEL
ALT: 17 U/L (ref 0–44)
AST: 18 U/L (ref 15–41)
Albumin: 2.8 g/dL — ABNORMAL LOW (ref 3.5–5.0)
Alkaline Phosphatase: 119 U/L (ref 38–126)
Anion gap: 8 (ref 5–15)
BUN: 27 mg/dL — ABNORMAL HIGH (ref 6–20)
CO2: 30 mmol/L (ref 22–32)
Calcium: 8.9 mg/dL (ref 8.9–10.3)
Chloride: 104 mmol/L (ref 98–111)
Creatinine, Ser: 1.08 mg/dL — ABNORMAL HIGH (ref 0.44–1.00)
GFR calc Af Amer: >60 mL/min (ref >60)
GFR calc non Af Amer: 56 mL/min — ABNORMAL LOW (ref >60)
Glucose, Bld: 124 mg/dL — ABNORMAL HIGH (ref 70–99)
Potassium: 4.3 mmol/L (ref 3.5–5.1)
Sodium: 142 mmol/L (ref 135–145)
Total Bilirubin: 0.6 mg/dL (ref 0.3–1.2)
Total Protein: 5.9 g/dL — ABNORMAL LOW (ref 6.5–8.1)

## 2018-10-08 LAB — PROTIME-INR
INR: 2.4 — ABNORMAL HIGH (ref 0.8–1.2)
Prothrombin Time: 25.5 seconds — ABNORMAL HIGH (ref 11.4–15.2)

## 2018-10-08 LAB — RESPIRATORY PANEL BY PCR

## 2018-10-08 LAB — D-DIMER, QUANTITATIVE: D-Dimer, Quant: 0.4 ug/mL-FEU (ref 0.00–0.50)

## 2018-10-08 LAB — PROCALCITONIN
Procalcitonin: <0.1 ng/mL
Procalcitonin: <0.1 ng/mL

## 2018-10-08 LAB — TRIGLYCERIDES: Triglycerides: 83 mg/dL (ref <150)

## 2018-10-08 LAB — MRSA PCR SCREENING: MRSA by PCR: NEGATIVE

## 2018-10-08 LAB — SEDIMENTATION RATE: Sed Rate: 30 mm/hr — ABNORMAL HIGH (ref 0–22)

## 2018-10-08 LAB — FERRITIN: Ferritin: 42 ng/mL (ref 11–307)

## 2018-10-08 LAB — AMMONIA: Ammonia: 38 umol/L — ABNORMAL HIGH (ref 9–35)

## 2018-10-08 LAB — C-REACTIVE PROTEIN: CRP: <0.8 mg/dL (ref <1.0)

## 2018-10-08 LAB — BRAIN NATRIURETIC PEPTIDE: B Natriuretic Peptide: 97.9 pg/mL (ref 0.0–100.0)

## 2018-10-08 LAB — STREP PNEUMONIAE URINARY ANTIGEN: Strep Pneumo Urinary Antigen: NEGATIVE

## 2018-10-08 LAB — FIBRINOGEN: Fibrinogen: 617 mg/dL — ABNORMAL HIGH (ref 210–475)

## 2018-10-08 LAB — LACTATE DEHYDROGENASE: LDH: 178 U/L (ref 98–192)

## 2018-10-08 LAB — TSH: TSH: 1.172 u[IU]/mL (ref 0.350–4.500)

## 2018-10-08 MED ORDER — DILTIAZEM HCL ER COATED BEADS 240 MG PO CP24
240.0000 mg | ORAL_CAPSULE | Freq: Every day | ORAL | Status: DC
  Administered 2018-10-08: 240 mg via ORAL
  Filled 2018-10-08 (×2): qty 1

## 2018-10-08 MED ORDER — MAGNESIUM SULFATE 2 GM/50ML IV SOLN
2.0000 g | Freq: Once | INTRAVENOUS | Status: AC
  Administered 2018-10-08: 2 g via INTRAVENOUS
  Filled 2018-10-08: qty 50

## 2018-10-08 MED ORDER — SENNA 8.6 MG PO TABS
2.0000 | ORAL_TABLET | Freq: Every day | ORAL | Status: DC | PRN
  Administered 2018-10-08: 17.2 mg via ORAL
  Filled 2018-10-08: qty 2

## 2018-10-08 MED ORDER — ALBUTEROL SULFATE HFA 108 (90 BASE) MCG/ACT IN AERS: 1.0000 | INHALATION_SPRAY | RESPIRATORY_TRACT | Status: DC | PRN

## 2018-10-08 MED ORDER — INSULIN ASPART 100 UNIT/ML ~~LOC~~ SOLN: 0.0000 [IU] | Freq: Every day | SUBCUTANEOUS | Status: DC

## 2018-10-08 MED ORDER — WARFARIN SODIUM 3 MG PO TABS
6.0000 mg | ORAL_TABLET | ORAL | Status: DC
  Administered 2018-10-08: 6 mg via ORAL
  Filled 2018-10-08: qty 2

## 2018-10-08 MED ORDER — METOPROLOL TARTRATE 25 MG PO TABS
25.0000 mg | ORAL_TABLET | Freq: Every day | ORAL | Status: DC
  Filled 2018-10-08: qty 1

## 2018-10-08 MED ORDER — INSULIN GLARGINE 100 UNIT/ML ~~LOC~~ SOLN
10.0000 [IU] | Freq: Every day | SUBCUTANEOUS | Status: DC
  Administered 2018-10-09: 10 [IU] via SUBCUTANEOUS
  Filled 2018-10-08 (×2): qty 0.1

## 2018-10-08 MED ORDER — WARFARIN SODIUM 5 MG PO TABS: 5.0000 mg | ORAL_TABLET | ORAL | Status: DC

## 2018-10-08 MED ORDER — FUROSEMIDE 80 MG PO TABS
80.0000 mg | ORAL_TABLET | Freq: Every day | ORAL | Status: DC
  Administered 2018-10-08 – 2018-10-09 (×2): 80 mg via ORAL
  Filled 2018-10-08 (×2): qty 1

## 2018-10-08 MED ORDER — ACETAMINOPHEN 325 MG PO TABS: 650.0000 mg | ORAL_TABLET | Freq: Four times a day (QID) | ORAL | Status: DC | PRN

## 2018-10-08 MED ORDER — INSULIN ASPART 100 UNIT/ML ~~LOC~~ SOLN: 0.0000 [IU] | Freq: Three times a day (TID) | SUBCUTANEOUS | Status: DC

## 2018-10-08 MED ORDER — COLCHICINE 0.6 MG PO TABS
0.6000 mg | ORAL_TABLET | Freq: Every day | ORAL | Status: DC | PRN
  Administered 2018-10-08: 0.6 mg via ORAL
  Filled 2018-10-08: qty 1

## 2018-10-08 MED ORDER — TIOTROPIUM BROMIDE MONOHYDRATE 18 MCG IN CAPS: 18.0000 ug | ORAL_CAPSULE | Freq: Every day | RESPIRATORY_TRACT | Status: DC

## 2018-10-08 MED ORDER — DOXYCYCLINE HYCLATE 100 MG PO TABS
100.0000 mg | ORAL_TABLET | Freq: Two times a day (BID) | ORAL | Status: DC
  Administered 2018-10-08 – 2018-10-09 (×3): 100 mg via ORAL
  Filled 2018-10-08 (×3): qty 1

## 2018-10-08 MED ORDER — SODIUM CHLORIDE 0.9 % IV SOLN
1.0000 g | INTRAVENOUS | Status: DC
  Administered 2018-10-08: 1 g via INTRAVENOUS
  Filled 2018-10-08 (×3): qty 10

## 2018-10-08 MED ORDER — UMECLIDINIUM BROMIDE 62.5 MCG/INH IN AEPB
1.0000 | INHALATION_SPRAY | Freq: Every day | RESPIRATORY_TRACT | Status: DC
  Administered 2018-10-09: 1 via RESPIRATORY_TRACT
  Filled 2018-10-08 (×2): qty 7

## 2018-10-08 MED ORDER — MOMETASONE FURO-FORMOTEROL FUM 200-5 MCG/ACT IN AERO
2.0000 | INHALATION_SPRAY | Freq: Two times a day (BID) | RESPIRATORY_TRACT | Status: DC
  Administered 2018-10-08 – 2018-10-09 (×2): 2 via RESPIRATORY_TRACT
  Filled 2018-10-08: qty 8.8

## 2018-10-08 MED ORDER — PANTOPRAZOLE SODIUM 40 MG PO TBEC
40.0000 mg | DELAYED_RELEASE_TABLET | Freq: Every day | ORAL | Status: DC
  Administered 2018-10-08 – 2018-10-09 (×2): 40 mg via ORAL
  Filled 2018-10-08 (×2): qty 1

## 2018-10-08 MED ORDER — ACETAZOLAMIDE 250 MG PO TABS
250.0000 mg | ORAL_TABLET | Freq: Two times a day (BID) | ORAL | Status: DC
  Administered 2018-10-08: 250 mg via ORAL
  Filled 2018-10-08 (×3): qty 1

## 2018-10-08 MED ORDER — ALBUTEROL SULFATE HFA 108 (90 BASE) MCG/ACT IN AERS
1.0000 | INHALATION_SPRAY | RESPIRATORY_TRACT | Status: DC | PRN
  Filled 2018-10-08: qty 6.7

## 2018-10-08 MED ORDER — METOPROLOL TARTRATE 25 MG PO TABS
25.0000 mg | ORAL_TABLET | Freq: Two times a day (BID) | ORAL | Status: DC
  Administered 2018-10-08 – 2018-10-09 (×2): 25 mg via ORAL
  Filled 2018-10-08 (×2): qty 1

## 2018-10-08 MED ORDER — FEBUXOSTAT 40 MG PO TABS: 40.0000 mg | ORAL_TABLET | Freq: Every day | ORAL | Status: DC

## 2018-10-08 MED ORDER — ASPIRIN EC 81 MG PO TBEC
81.0000 mg | DELAYED_RELEASE_TABLET | Freq: Every day | ORAL | Status: DC
  Administered 2018-10-08 – 2018-10-09 (×2): 81 mg via ORAL
  Filled 2018-10-08 (×2): qty 1

## 2018-10-08 MED ORDER — INSULIN GLARGINE 100 UNIT/ML ~~LOC~~ SOLN
23.0000 [IU] | Freq: Every day | SUBCUTANEOUS | Status: DC
  Administered 2018-10-08: 23 [IU] via SUBCUTANEOUS
  Filled 2018-10-08 (×2): qty 0.23

## 2018-10-08 MED ORDER — SODIUM CHLORIDE 0.9 % IV SOLN
2.0000 g | Freq: Three times a day (TID) | INTRAVENOUS | Status: DC
  Administered 2018-10-08: 2 g via INTRAVENOUS
  Filled 2018-10-08 (×5): qty 2

## 2018-10-08 MED ORDER — WARFARIN - PHARMACIST DOSING INPATIENT: Freq: Every day | Status: DC

## 2018-10-08 MED ORDER — TRAMADOL HCL 50 MG PO TABS
50.0000 mg | ORAL_TABLET | Freq: Four times a day (QID) | ORAL | Status: DC | PRN
  Administered 2018-10-08: 50 mg via ORAL
  Filled 2018-10-08: qty 1

## 2018-10-08 MED ORDER — WARFARIN SODIUM 3 MG PO TABS: 6.0000 mg | ORAL_TABLET | ORAL | Status: DC

## 2018-10-08 NOTE — Discharge Summary (Signed)
Family Medicine Teaching [facility] Discharge Summary  Patient name: Emily Fry Medical record number: [id number] Date of birth: February 03, 1956 Age: 63 y.o. Gender: female Date of Admission: 10/07/2018  Date of Discharge: 10/09/2018 Admitting Physician: Leighton Roach McDiarmid, MD  Primary Care Provider: Lauro Regulus, MD Consultants: None  Indication for Hospitalization: Acute Hypercarbia Respiratory Failure  Discharge Diagnoses/Problem List:  Altered mental status secondary to acute hypercarbic respiratory failure: Resolved PUI for COVID-19: Negative x2 COPD Chronic respiratory failure OSA CAP HFpEF Hypertension Atrial fibrillation CAD, history of MI OSA Gout OA bilateral knees Type 2 diabetes CKD 3 Morbid obesity   Disposition: back to SNF  Discharge Condition: stable  Discharge Exam:   Physical Exam: General: 63 y.o. female in NAD Cardio: irregularly irregular Lungs: CTAB, no increased WOB on 3L per [place] Abdomen: Soft, non-tender to palpation, positive bowel sounds Skin: warm and dry Extremities: No edema  Brief Hospital Course:  Emily Fry a 63 y.o.femaleadmitted for tremor, cough, from [facility] where patient has lived for the last year.Herchronic conditions include DM II, OSA, obesity, HTN, Gout, COPD w/ chronic respiratory failure (on 2L O2), and a fib on Coumadin.   Patient presented with altered mental status.  She was found to be acidotic with pH of 7.1 and CO2 greater than 100.  Patient quickly resolved the following a.m.  She was back to baseline with 2 L nasal cannula.  Patient uses CPAP at skilled nursing facility.  However she was not getting this.  It was felt that her altered mental status was secondary to hypercarbia from CO2 retention.  CT head negative for acute bleed. Patient was PUI for COVID-19.  Rapid COVID negative, supplemental labs mixed.  Send-out COVID was also negative.  Patient had newfound left lobe opacity  concerning for community acquired pneumonia.  Initially started with aztreonam and vancomycin.  Then was transitioned to cefepime and vancomycin.  Then reports narrowed to ceftriaxone and doxycycline.  She remained afebrile with improving respiratory status on home 2 L and WBC within normal limits, therefore ceftriaxone was transitioned to p.o. cefdinir.  She will complete a total 7-day course of cefdinir and doxycycline finishing on 5/5.  Patient initially had some right arm twitching which is likely secondary to her hypercarbia, which resolved by the time of discharge with improvement in her respiratory status.  Issues for Follow Up:  1. Uloric was discontinued due to patient's history of CAD.  Consider allopurinol for gout prophylaxis. 2. Patient was on metoprolol tartrate daily, changed to twice daily.  Significant Procedures: None  Significant Labs and Imaging:  Recent Labs  Lab 10/07/18 1642 10/07/18 1658 10/08/18 0636 10/09/18 0803  WBC 8.6  --  6.8 5.4  HGB 12.0 12.9 10.9* 10.8*  HCT 41.8 38.0 38.9 37.0  PLT 184  --  149* 159   Recent Labs  Lab 10/07/18 1642 10/07/18 1658 10/08/18 0636 10/09/18 0803  NA 144 143 142 141  K 4.5 4.4 4.3 3.8  CL 101  --  104 99  CO2 35*  --  30 37*  GLUCOSE 140*  --  124* 100*  BUN 28*  --  27* 24*  CREATININE 1.18*  --  1.08* 0.99  CALCIUM 9.4  --  8.9 8.8*  MG 1.9  --   --   --   ALKPHOS 141*  --  119  --   AST 20  --  18  --   ALT 20  --  17  --  ALBUMIN 3.1*  --  2.8*  --     Ct Head Wo Contrast  Result Date: 10/07/2018 CLINICAL DATA:  Seizure.  Bilateral hand twitching EXAM: CT HEAD WITHOUT CONTRAST TECHNIQUE: Contiguous axial images were obtained from the base of the skull through the vertex without intravenous contrast. COMPARISON:  None. FINDINGS: Brain: Ventricle size and cerebral volume normal. Negative for acute infarct. Negative for hemorrhage or mass. Vascular: Negative for hyperdense vessel Skull: Negative  Sinuses/Orbits: Bilateral exophthalmos. No orbital mass. Paranasal sinuses clear. Other: None IMPRESSION: No acute intracranial abnormality Exophthalmos. Electronically Signed   By: Marlan Palau M.D.   On: 10/07/2018 17:10   Dg Chest Portable 1 View  Result Date: 10/07/2018 CLINICAL DATA:  Tremor.  Cough. EXAM: PORTABLE CHEST 1 VIEW COMPARISON:  04/14/2018 and 05/17/2008 FINDINGS: There is abnormal increased density at the left lung base, new since the prior study. Heart size and pulmonary vascularity are normal. There is a new small focal area of linear atelectasis in the right upper lobe. No effusions. IMPRESSION: Area of new infiltrate at the left lung base. Minimal atelectasis in the right upper lobe. Electronically Signed   By: Francene Boyers M.D.   On: 10/07/2018 17:14   Results/Tests Pending at Time of Discharge: None  Discharge Medications:  Allergies as of 10/09/2018      Reactions   Contrast Media [iodinated Diagnostic Agents] Swelling   Tomato Other (See Comments)   unknow per MAR   Amoxicillin Diarrhea, Itching, Other (See Comments)   Has patient had a PCN reaction causing immediate rash, facial/tongue/throat swelling, SOB or lightheadedness with hypotension: No Has patient had a PCN reaction causing severe rash involving mucus membranes or skin necrosis: No Has patient had a PCN reaction that required hospitalization: No Has patient had a PCN reaction occurring within the last 10 years: Yes If all of the above answers are "NO", then may proceed with Cephalosporin use.      Medication List    STOP taking these medications   febuxostat 40 MG tablet Commonly known as:  ULORIC   potassium chloride SA 20 MEQ tablet Commonly known as:  K-DUR   traMADol 50 MG tablet Commonly known as:  ULTRAM     TAKE these medications   acetaminophen 325 MG tablet Commonly known as:  TYLENOL Take 650 mg by mouth every 4 (four) hours as needed for fever. What changed:  Another medication  with the same name was removed. Continue taking this medication, and follow the directions you see here.   acetaZOLAMIDE 250 MG tablet Commonly known as:  DIAMOX Take 250 mg by mouth 2 (two) times daily.   albuterol 108 (90 Base) MCG/ACT inhaler Commonly known as:  VENTOLIN HFA Inhale 1-2 puffs into the lungs every 3 (three) hours as needed for wheezing or shortness of breath.   aspirin EC 81 MG tablet Take 81 mg by mouth daily.   Cartia XT 240 MG 24 hr capsule Generic drug:  diltiazem Take 240 mg by mouth at bedtime.   cefdinir 300 MG capsule Commonly known as:  OMNICEF Take 1 capsule (300 mg total) by mouth every 12 (twelve) hours for 11 doses.   Cholecalciferol 25 MCG (1000 UT) tablet Take 2,000 Units by mouth daily.   Colchicine 0.6 MG Caps Take 0.6 mg by mouth daily as needed (gout).   diclofenac sodium 1 % Gel Commonly known as:  VOLTAREN Apply 2 g topically every 12 (twelve) hours as needed (for gout apply bilateral knees  for pain).   doxycycline 100 MG tablet Commonly known as:  VIBRA-TABS Take 1 tablet (100 mg total) by mouth every 12 (twelve) hours for 11 doses.   furosemide 80 MG tablet Commonly known as:  LASIX Take 80 mg by mouth daily. afternoon   Insulin Glargine 100 UNIT/ML Solostar Pen Commonly known as:  LANTUS Inject 10 Units into the skin at bedtime.   isosorbide mononitrate 30 MG 24 hr tablet Commonly known as:  IMDUR Take 30 mg by mouth daily.   metoprolol tartrate 25 MG tablet Commonly known as:  LOPRESSOR Take 1 tablet (25 mg total) by mouth 2 (two) times daily. What changed:  when to take this   mometasone-formoterol 200-5 MCG/ACT Aero Commonly known as:  DULERA Inhale 2 puffs into the lungs 2 (two) times daily.   nystatin powder Commonly known as:  MYCOSTATIN/NYSTOP Apply topically every 12 (twelve) hours as needed (yeast). Under breast   pantoprazole 40 MG tablet Commonly known as:  PROTONIX Take 40 mg by mouth daily.   senna  8.6 MG tablet Commonly known as:  SENOKOT Take 2 tablets by mouth daily as needed for constipation.   Trulicity 1.5 MG/0.5ML Sopn Generic drug:  Dulaglutide Inject 1.5 mg into the skin once a week. On saturday   umeclidinium bromide 62.5 MCG/INH Aepb Commonly known as:  INCRUSE ELLIPTA Inhale 1 puff into the lungs daily. Start taking on:  Oct 10, 2018   warfarin 5 MG tablet Commonly known as:  COUMADIN Take 5 mg by mouth See admin instructions. Take 1 tablet by mouth at bedtime every Tuesday, Thursday, Saturday and Sunday   warfarin 6 MG tablet Commonly known as:  COUMADIN Take 6 mg by mouth See admin instructions. Take 1 tablet by mouth at bedtime every Monday, Wednesday, Friday for Afib for 2 weeks.       Discharge Instructions: Please refer to Patient Instructions section of EMR for full details.  Patient was counseled important signs and symptoms that should prompt return to medical care, changes in medications, dietary instructions, activity restrictions, and follow up appointments.   Follow-Up Appointments: Follow-up Information    Lauro Regulus, MD. Schedule an appointment as soon as possible for a visit in 1 week(s).   Specialty:  Internal Medicine Contact information: [address] [facility] [place] [address] [phone]           Unknown Jim, DO 10/09/2018, 12:44 PM PGY-1, [facility]

## 2018-10-08 NOTE — Progress Notes (Addendum)
Family Medicine Teaching Service Daily Progress Note Intern Pager: [phone]  Patient name: Emily Fry Medical record number: [id number] Date of birth: 1956-03-05 Age: 63 y.o. Gender: female  Primary Care Provider: Lauro Regulus, MD Consultants: CCM (In ED) Code Status: Full  Pt Overview and Major Events to Date:  Emily Fry is a 63 y.o. female admitted for tremor, cough, from [facility] where patient has lived for the last year. Her chronic conditions include DM II, OSA, obesity, HTN, Gout, COPD w/ chronic respiratory failure (on 2L O2), and a fib on Coumadin  Assessment and Plan:  #AMS secondary to Acute Hypercarbic Respiratory Failure (improved) AMS resolved this morning.  Patient reports inability to use CPAP at SNF.  This likely led to CO2 retention as evidenced by admission ABG w/ pH 7.1 and pCO2 > 100. Patient alert and oriented x4 this a.m.  Hypercarbia could also explain patient's newfound arm "twitching".  Seizures much less likely given this information, but cannot r/o. Will call SNF to inquire more about frequency of AMS episodes.   Consider EEG to r/o siezure   #PUI for COVID-19 Initially concerning due to altered mental status and findings on chest x-ray.  However given rapid improvement with supplemental oxygen, greatly reduces likelihood of any infection, including COVID-19.  Initial COVID-19 was negative.  Supplemental COVID-19 labs pending.  Signout COVID lab pending.  # COPD # Chronic Respiratory Failure  # OSA #?CAP On 2 L of oxygen at baseline. Wears CPAP qhs, normally, but has been w/o due to SNF facility  Patient does not have any controller medications on her medication list. Patient also new left lung opacity on CXR. Plan to narrow antibiotics. After COVID r/o, restart CPAP qhs.   Continuous O2  Continuous pulse ox  D/c antibiotics vanc (4/28-) and cefepime (4/29-) per pharm  Narrow to CTX (4/29-) and doxycycline  #HFpEF #  HTN Last EF listed in 2010 was >55% at [facility]. At home, patient on Lasix 80 mg, metoprolol 25 mg, Imdur 30 mg, Cartia XT 240 mg.  Continue home metoprolol and Cartia for continued rate control  Plan to restart lasix   In outpatient setting, consider d/c'ing diltiazem if can tolerate due to increased mortality associated with, if ejection fraction worsens  #A fib Patient's heart rate in 80s. On Coumadin at home.  INR 2.3 in ED.  Continue Cartia and metoprolol for rate control  Coumadin per pharmacy  Continuous cardiac monitoring  #CAD #Hx of MI  Continue home ASA  # OSA Does not wear CPAP at home, but does wear O2 at night.    Continue night O2  # Gout At home, on februxustat scheduled and PRN colchicine    Discontinue daily febuxostat given new black box warning  Colchicine prophylaxis  # OA BL Knees At home, tramadol 50mg  q 6 hours PRN pain    Continue tramadol 50 mg every 6 hours as needed pain  Hold tramadol if patient with altered mental status  #DM II Patient tolerating PO this AM. Will restart Lantus at 1/2 home dose.  CBG every 4 hours  SSIr + CBGs TID w/ meals  QHS insulin sliding scale  # CKD 3 Cr 1.18. Baseline GFR is 50s and creatinine 1.2.  Patient at baseline kidney function  Hold nephrotoxic medications  Monitor Cr  #Morbid Obesity, BMI 56.99 Limited mobility   OOB with assistance only   PT/OT  #FEN/GI:   Fluids: KVO  Electrolytes: Within normal limits  Nutrition: HHD/Carb  modified  Access: L PIV VTE prophylaxis: Coumadin  Disposition: Transfer to med-surg  Subjective:  Please see attending attestation.   Objective: Temp:  [98.2 F (36.8 C)-98.7 F (37.1 C)] 98.2 F (36.8 C) (04/29 0348) Pulse Rate:  [82-93] 93 (04/29 0348) Resp:  [13-32] 18 (04/29 0348) BP: (93-149)/(46-102) 97/64 (04/29 0348) SpO2:  [93 %-99 %] 94 % (04/29 0348) Weight:  [150.6 kg] 150.6 kg (04/28 1608) Physical Exam: Please see  attending attestation.   Laboratory: Recent Labs  Lab 10/07/18 1642 10/07/18 1658 10/08/18 0636  WBC 8.6  --  6.8  HGB 12.0 12.9 10.9*  HCT 41.8 38.0 38.9  PLT 184  --  149*   Recent Labs  Lab 10/07/18 1642 10/07/18 1658 10/08/18 0636  NA 144 143 142  K 4.5 4.4 4.3  CL 101  --  104  CO2 35*  --  30  BUN 28*  --  27*  CREATININE 1.18*  --  1.08*  CALCIUM 9.4  --  8.9  PROT 6.6  --  5.9*  BILITOT 0.4  --  0.6  ALKPHOS 141*  --  119  ALT 20  --  17  AST 20  --  18  GLUCOSE 140*  --  124*    Imaging/Diagnostic Tests: Ct Head Wo Contrast  Result Date: 10/07/2018 CLINICAL DATA:  Seizure.  Bilateral hand twitching EXAM: CT HEAD WITHOUT CONTRAST TECHNIQUE: Contiguous axial images were obtained from the base of the skull through the vertex without intravenous contrast. COMPARISON:  None. FINDINGS: Brain: Ventricle size and cerebral volume normal. Negative for acute infarct. Negative for hemorrhage or mass. Vascular: Negative for hyperdense vessel Skull: Negative Sinuses/Orbits: Bilateral exophthalmos. No orbital mass. Paranasal sinuses clear. Other: None IMPRESSION: No acute intracranial abnormality Exophthalmos. Electronically Signed   By: Marlan Palau M.D.   On: 10/07/2018 17:10   Dg Chest Portable 1 View  Result Date: 10/07/2018 CLINICAL DATA:  Tremor.  Cough. EXAM: PORTABLE CHEST 1 VIEW COMPARISON:  04/14/2018 and 05/17/2008 FINDINGS: There is abnormal increased density at the left lung base, new since the prior study. Heart size and pulmonary vascularity are normal. There is a new small focal area of linear atelectasis in the right upper lobe. No effusions. IMPRESSION: Area of new infiltrate at the left lung base. Minimal atelectasis in the right upper lobe. Electronically Signed   By: Francene Boyers M.D.   On: 10/07/2018 17:14    Garnette Gunner, MD 10/08/2018, 8:12 AM PGY-2, [facility] FPTS Intern pager: [phone], text pages welcome

## 2018-10-08 NOTE — Progress Notes (Signed)
ANTICOAGULATION CONSULT NOTE  Pharmacy Consult for Coumadin Indication: atrial fibrillation  Allergies  Allergen Reactions  . Contrast Media [Iodinated Diagnostic Agents] Swelling  . Tomato Other (See Comments)    unknow per MAR  . Amoxicillin Diarrhea, Itching and Other (See Comments)    Has patient had a PCN reaction causing immediate rash, facial/tongue/throat swelling, SOB or lightheadedness with hypotension: No Has patient had a PCN reaction causing severe rash involving mucus membranes or skin necrosis: No Has patient had a PCN reaction that required hospitalization: No Has patient had a PCN reaction occurring within the last 10 years: Yes If all of the above answers are "NO", then may proceed with Cephalosporin use.     Patient Measurements: Height: 5\' 4"  (162.6 cm) Weight: (!) 332 lb (150.6 kg) IBW/kg (Calculated) : 54.7  Vital Signs: Temp: 97.5 F (36.4 C) (04/29 0800) Temp Source: Oral (04/29 0800) BP: 103/46 (04/29 1100) Pulse Rate: 102 (04/29 1100)  Labs: Recent Labs    10/07/18 1642 10/07/18 1658 10/08/18 0001 10/08/18 0636  HGB 12.0 12.9  --  10.9*  HCT 41.8 38.0  --  38.9  PLT 184  --   --  149*  LABPROT 24.7*  --   --  25.5*  INR 2.3*  --   --  2.4*  CREATININE 1.18*  --   --  1.08*  TROPONINI  --   --  <0.03  --     Estimated Creatinine Clearance: 81.4 mL/min (A) (by C-G formula based on SCr of 1.08 mg/dL (H)).   Medical History: Past Medical History:  Diagnosis Date  . A-fib (HCC)   . Allergic rhinitis   . Bilateral primary osteoarthritis of knee   . Chronic respiratory failure (HCC)    on 2L home o2  . COPD (chronic obstructive pulmonary disease) (HCC)   . Diastolic heart failure (HCC)   . GERD (gastroesophageal reflux disease)   . Gout   . Hypertension   . Obesity   . Sleep apnea     Medications:  Medications Prior to Admission  Medication Sig Dispense Refill Last Dose  . acetaminophen (TYLENOL) 325 MG tablet Take 650 mg by  mouth daily.   10/07/2018 at 0900  . acetaminophen (TYLENOL) 325 MG tablet Take 650 mg by mouth every 4 (four) hours as needed for fever.   unk  . acetaZOLAMIDE (DIAMOX) 250 MG tablet Take 250 mg by mouth 2 (two) times daily.  1 10/07/2018 at 0900  . aspirin EC 81 MG tablet Take 81 mg by mouth daily.   10/07/2018 at 0900  . CARTIA XT 240 MG 24 hr capsule Take 240 mg by mouth at bedtime.  0 10/06/2018 at 2100  . Cholecalciferol 25 MCG (1000 UT) tablet Take 2,000 Units by mouth daily.   10/07/2018 at 0900  . Colchicine 0.6 MG CAPS Take 0.6 mg by mouth daily as needed (gout).   0 unk  . diclofenac sodium (VOLTAREN) 1 % GEL Apply 2 g topically every 12 (twelve) hours as needed (for gout apply bilateral knees for pain).   unk  . febuxostat (ULORIC) 40 MG tablet Take 40 mg by mouth daily.   1 10/07/2018 at 0900  . furosemide (LASIX) 80 MG tablet Take 80 mg by mouth daily. afternoon  1 10/07/2018 at 1200  . Insulin Glargine (LANTUS) 100 UNIT/ML Solostar Pen Inject 45 Units into the skin daily at 10 pm. (Patient taking differently: Inject 10 Units into the skin at bedtime. ) 15  mL 11 10/06/2018 at 2100  . metoprolol tartrate (LOPRESSOR) 25 MG tablet Take 25 mg by mouth at bedtime.   0 10/06/2018 at 2100  . nystatin (MYCOSTATIN/NYSTOP) powder Apply topically every 12 (twelve) hours as needed (yeast). Under breast   unk  . pantoprazole (PROTONIX) 40 MG tablet Take 40 mg by mouth daily.  11 10/07/2018 at 0630  . potassium chloride SA (K-DUR,KLOR-CON) 20 MEQ tablet Take 60 mEq by mouth daily.   0 10/07/2018 at 0900  . senna (SENOKOT) 8.6 MG tablet Take 2 tablets by mouth daily as needed for constipation.   09/20/2018  . traMADol (ULTRAM) 50 MG tablet Take 50 mg by mouth every 6 (six) hours as needed for moderate pain.   09/13/2018  . TRULICITY 1.5 MG/0.5ML SOPN Inject 1.5 mg into the skin once a week. On saturday  11 09/27/2018  . warfarin (COUMADIN) 5 MG tablet Take 5 mg by mouth See admin instructions. Take 1 tablet by  mouth at bedtime every Tuesday, Thursday, Saturday and Sunday   10/04/2018 at 2100  . warfarin (COUMADIN) 6 MG tablet Take 6 mg by mouth See admin instructions. Take 1 tablet by mouth at bedtime every Monday, Wednesday, Friday for Afib for 2 weeks.   10/06/2018 at 2100  . isosorbide mononitrate (IMDUR) 30 MG 24 hr tablet Take 30 mg by mouth daily.   10/07/2018 at 1130    Assessment: 63 y.o. F admitted with PNA. Pt on Coumadin PTA for afib. Admission INR 2.3 (therapeutic).  Home dose: 5mg T/T/S/S and 6mg M/W/F. Last dose 4/27 PM.  INR remains therapeutic today at 2.4. Hgb down from 12.9 to 10.9 today and platelets down too, but no bleeding noted on exam per MD. Noted that patient was started on doxycycline today for PNA which can increase INR. Will adjust Coumadin as needed and continue with home Coumadin dose for now. Of note, patient did not get Coumadin dose 4/28.  Goal of Therapy:  INR 2-3 Monitor platelets by anticoagulation protocol: Yes   Plan:  Continue home coumadin 5mg T/T/S/S and 6mg M/W/F Daily INR  Jacquette Canales, PharmD PGY1 Pharmacy Resident Phone [phone] 10/08/2018     12 :08 PM

## 2018-10-08 NOTE — Progress Notes (Signed)
Spoke with [facility] facility. They said that they did not have any documented episode of repeat episodes of AMS 48 other than the one that lead to admission. VSS w/ pulse ox 90+% with O2.

## 2018-10-08 NOTE — Progress Notes (Signed)
Pharmacy Antibiotic Note  Emily Fry is a 63 y.o. female admitted on 10/07/2018 with pneumonia.  Outpatient neg for covid and repeat in house neg. Consulted for Cefepime. Already on vancomycin   Plan: Add cefepime 2g IV q8h Already on vancomycin  Trend WBC, temp, renal function.   Height: 5\' 4"  (162.6 cm) Weight: (!) 332 lb (150.6 kg) IBW/kg (Calculated) : 54.7  Temp (24hrs), Avg:98.7 F (37.1 C), Min:98.7 F (37.1 C), Max:98.7 F (37.1 C)  Recent Labs  Lab 10/07/18 1642  WBC 8.6  CREATININE 1.18*    Estimated Creatinine Clearance: 74.5 mL/min (A) (by C-G formula based on SCr of 1.18 mg/dL (H)).    Abran Duke, PharmD, BCPS Clinical Pharmacist Phone: [phone]

## 2018-10-08 NOTE — Progress Notes (Signed)
ANTICOAGULATION CONSULT NOTE - Initial Consult  Pharmacy Consult for Coumadine Indication: atrial fibrillation  Allergies  Allergen Reactions  . Contrast Media [Iodinated Diagnostic Agents] Swelling  . Tomato Other (See Comments)    unknow per MAR  . Amoxicillin Diarrhea, Itching and Other (See Comments)    Has patient had a PCN reaction causing immediate rash, facial/tongue/throat swelling, SOB or lightheadedness with hypotension: No Has patient had a PCN reaction causing severe rash involving mucus membranes or skin necrosis: No Has patient had a PCN reaction that required hospitalization: No Has patient had a PCN reaction occurring within the last 10 years: Yes If all of the above answers are "NO", then may proceed with Cephalosporin use.     Patient Measurements: Height: 5\' 4"  (162.6 cm) Weight: (!) 332 lb (150.6 kg) IBW/kg (Calculated) : 54.7  Vital Signs: Temp: 98.2 F (36.8 C) (04/29 0048) Temp Source: Oral (04/29 0048) BP: 110/58 (04/29 0048) Pulse Rate: 86 (04/29 0048)  Labs: Recent Labs    10/07/18 1642 10/07/18 1658 10/08/18 0001  HGB 12.0 12.9  --   HCT 41.8 38.0  --   PLT 184  --   --   LABPROT 24.7*  --   --   INR 2.3*  --   --   CREATININE 1.18*  --   --   TROPONINI  --   --  <0.03    Estimated Creatinine Clearance: 74.5 mL/min (A) (by C-G formula based on SCr of 1.18 mg/dL (H)).   Medical History: Past Medical History:  Diagnosis Date  . A-fib (HCC)   . Allergic rhinitis   . Bilateral primary osteoarthritis of knee   . Chronic respiratory failure (HCC)    on 2L home o2  . COPD (chronic obstructive pulmonary disease) (HCC)   . Diastolic heart failure (HCC)   . GERD (gastroesophageal reflux disease)   . Gout   . Hypertension   . Obesity   . Sleep apnea     Medications:  Medications Prior to Admission  Medication Sig Dispense Refill Last Dose  . acetaminophen (TYLENOL) 325 MG tablet Take 650 mg by mouth daily.   10/07/2018 at 0900  .  acetaminophen (TYLENOL) 325 MG tablet Take 650 mg by mouth every 4 (four) hours as needed for fever.   unk  . acetaZOLAMIDE (DIAMOX) 250 MG tablet Take 250 mg by mouth 2 (two) times daily.  1 10/07/2018 at 0900  . aspirin EC 81 MG tablet Take 81 mg by mouth daily.   10/07/2018 at 0900  . CARTIA XT 240 MG 24 hr capsule Take 240 mg by mouth at bedtime.  0 10/06/2018 at 2100  . Cholecalciferol 25 MCG (1000 UT) tablet Take 2,000 Units by mouth daily.   10/07/2018 at 0900  . Colchicine 0.6 MG CAPS Take 0.6 mg by mouth daily as needed (gout).   0 unk  . diclofenac sodium (VOLTAREN) 1 % GEL Apply 2 g topically every 12 (twelve) hours as needed (for gout apply bilateral knees for pain).   unk  . febuxostat (ULORIC) 40 MG tablet Take 40 mg by mouth daily.   1 10/07/2018 at 0900  . furosemide (LASIX) 80 MG tablet Take 80 mg by mouth daily. afternoon  1 10/07/2018 at 1200  . Insulin Glargine (LANTUS) 100 UNIT/ML Solostar Pen Inject 45 Units into the skin daily at 10 pm. (Patient taking differently: Inject 10 Units into the skin at bedtime. ) 15 mL 11 10/06/2018 at 2100  . metoprolol  tartrate (LOPRESSOR) 25 MG tablet Take 25 mg by mouth at bedtime.   0 10/06/2018 at 2100  . nystatin (MYCOSTATIN/NYSTOP) powder Apply topically every 12 (twelve) hours as needed (yeast). Under breast   unk  . pantoprazole (PROTONIX) 40 MG tablet Take 40 mg by mouth daily.  11 10/07/2018 at 0630  . potassium chloride SA (K-DUR,KLOR-CON) 20 MEQ tablet Take 60 mEq by mouth daily.   0 10/07/2018 at 0900  . senna (SENOKOT) 8.6 MG tablet Take 2 tablets by mouth daily as needed for constipation.   09/20/2018  . traMADol (ULTRAM) 50 MG tablet Take 50 mg by mouth every 6 (six) hours as needed for moderate pain.   09/13/2018  . TRULICITY 1.5 MG/0.5ML SOPN Inject 1.5 mg into the skin once a week. On saturday  11 09/27/2018  . warfarin (COUMADIN) 5 MG tablet Take 5 mg by mouth See admin instructions. Take 1 tablet by mouth at bedtime every Tuesday,  Thursday, Saturday and Sunday   10/04/2018 at 2100  . warfarin (COUMADIN) 6 MG tablet Take 6 mg by mouth See admin instructions. Take 1 tablet by mouth at bedtime every Monday, Wednesday, Friday for Afib for 2 weeks.   10/06/2018 at 2100  . isosorbide mononitrate (IMDUR) 30 MG 24 hr tablet Take 30 mg by mouth daily.   10/07/2018 at 1130    Assessment: 63 y.o. F admitted with PNA. Pt on Coumadin PTA for afib. Admission INR 2.3 (therapeutic). CBC ok on admission. Home dose: 5mg  T/T/S/S and 6mg  M/W/F  Goal of Therapy:  INR 2-3 Monitor platelets by anticoagulation protocol: Yes   Plan:  Daily INR Continue home coumadin 5mg  T/T/S/S and 6mg  M/W/F  Christoper Fabian, PharmD, BCPS Clinical pharmacist  **Pharmacist phone directory can now be found on [email or web address] ([email or web address]).  Listed under [facility]. 10/08/2018,1:16 AM

## 2018-10-08 NOTE — Progress Notes (Signed)
NAME:  Emily Fry, MRN:  [id number], DOB:  [birth year], LOS: 1 ADMISSION DATE:  10/07/2018, CONSULTATION DATE:  4/28 REFERRING MD:  Dr. McDiarmid, CHIEF COMPLAINT:  AMS, Resp failure  Brief History   63 year old female resident of [facility] presenting with altered mental status and respiratory failure as a COVID rule out.   History of present illness   63 year old female with PMH as below, which is significant for COPD on home O2, OSA (not on CPAP), Atrial fibrillation, and HTN. She resides in [facility], where there have been documented COVID-19 cases. She has tested negative there as an outpatient. She presented to [facility] ED 4/28 with complaints of upper extremity tremor x 1 month. Also noted to have cough in ED and was mentating well upon arrival. CXR was done demonstrating possible consolidation. She was started on ABX and planned for admission to FPTS. However, her mental status worsened and ABG demonstrated severe respiratory acidosis. CT head did not describe any acute issue. PCCM consulted for further management.   Past Medical History   has a past medical history of A-fib (HCC), Allergic rhinitis, Bilateral primary osteoarthritis of knee, Chronic respiratory failure (HCC), COPD (chronic obstructive pulmonary disease) (HCC), Diastolic heart failure (HCC), GERD (gastroesophageal reflux disease), Gout, Hypertension, Obesity, and Sleep apnea.  Significant Hospital Events   4/28 admitted to [facility]   Consults:  PCCM  Procedures:    Significant Diagnostic Tests:  CT Head 4/28>>No acute intracranial abnormality CXR 4/28>> new infiltrate  left lung base. Minimal atelectasis in the right upper lobe.  Micro Data:  COVID-19 4/19 > negative COVID-19 4/28 > negative Blood cx 4/28 > Urine cx 4/28 > Sputum 4/28 > RVP 4/28 >Negative Urine strep 4/28 > Urine legionella 4/28 >  Antimicrobials:  Aztreonam 4/28 > Vancomycin 4/28 >  Interim history/subjective:  Improved mental status ,  afebrile, PCT is < 0.10, remains on baseline home oxygen of 2 L with saturations of 92%, no distress. Mental status has cleared. She states she feels much better  Objective   Blood pressure 112/65, pulse 94, temperature (!) 97.5 F (36.4 C), temperature source Oral, resp. rate 18, height  (1.626 m), weight (!) 150.6 kg, SpO2 92 %.        Intake/Output Summary (Last 24 hours) at 10/08/2018 4098 Last data filed at 10/08/2018 0800 Gross per 24 hour  Intake 1585 ml  Output 450 ml  Net 1135 ml   Filed Weights   10/07/18 1608  Weight: (!) 150.6 kg    Examination: General: Morbidly obese female, supine in bed, awake and alert HENT: [place]/AT, PERRL, no JVD, thick neck Lungs: Bilateral chest excursion, Clear bilateral breath sounds, diminished per bases Cardiovascular: S1, S2, IRIR, no MRG Abdomen: Soft, non-tender. ND, Obese.  Extremities: No obvious deformities, No acute deformity. No obvious edema.  Neuro: Alert, oriented x 3. Non-focal.  Follows commands  Resolved Hospital Problem list     Assessment & Plan:  Acute on chronic hypercarbic and hypoxemic respiratory failure:  Likely in the setting of HCAP. Respiratory acidosis on ABG seems out of proportion to neuro exam. I wonder if this is a spurious result. Has a negative COVID test from 4/19 and 4/28. Has been at [facility] so at risk exposure.  HCAP OSA / OHS not on CPAP - Admit to PCU under FPTS - ABX per FPTS - Follow cultures, RVP, COVID-19, urinary antigens.  - Supplemental O2. Decrease to home 2L to target SpO2 88-95% -  Repeat ABG only if work of breathing changes/O2 desaturations/worseneing encephalopathy. - Follow up CXR for clearing  - Will need  CPAP/ Bipap  therapy at nursing home to prevent further hypercapnic respiratory failure   COPD without acute exacerbation (on 2L O2 outpatient).  No bronchodilators on her home med list.  Appears to be at baseline 4/29 - No role steroids - O2 as above - Proair PRN   Elevated Fibrinogen>> 617 D dimer 0.40 P  Continue home Coumadin per primary team  As patient has returned to baseline pulmonary status PCCM will sign off/ Please do not hesitate to re consult if patient respiratory status declines, or if we can be of further assistance.   Best practice:  Diet: Per primary Pain/Anxiety/Delirium protocol (if indicated): N/a VAP protocol (if indicated): N/a DVT prophylaxis: Per primary GI prophylaxis: Per primary Glucose control: Per primary Mobility: BR Code Status: FULL Family Communication: Patient updated.  Disposition: PCU.   Labs   CBC: Recent Labs  Lab 10/07/18 1642 10/07/18 1658 10/08/18 0636  WBC 8.6  --  6.8  NEUTROABS 6.8  --  5.0  HGB 12.0 12.9 10.9*  HCT 41.8 38.0 38.9  MCV 101.5*  --  99.7  PLT 184  --  149*    Basic Metabolic Panel: Recent Labs  Lab 10/07/18 1642 10/07/18 1658 10/08/18 0636  NA 144 143 142  K 4.5 4.4 4.3  CL 101  --  104  CO2 35*  --  30  GLUCOSE 140*  --  124*  BUN 28*  --  27*  CREATININE 1.18*  --  1.08*  CALCIUM 9.4  --  8.9  MG 1.9  --   --    GFR: Estimated Creatinine Clearance: 81.4 mL/min (A) (by C-G formula based on SCr of 1.08 mg/dL (H)). Recent Labs  Lab 10/07/18 1642 10/08/18 0001 10/08/18 0636  PROCALCITON  --  <0.10 <0.10  WBC 8.6  --  6.8    Liver Function Tests: Recent Labs  Lab 10/07/18 1642 10/08/18 0636  AST 20 18  ALT 20 17  ALKPHOS 141* 119  BILITOT 0.4 0.6  PROT 6.6 5.9*  ALBUMIN 3.1* 2.8*   No results for input(s): LIPASE, AMYLASE in the last 168 hours. Recent Labs  Lab 10/08/18 0002  AMMONIA 38*    ABG    Component Value Date/Time   PHART 7.179 (LL) 10/07/2018 2200   PCO2ART 101 (HH) 10/07/2018 2200   PO2ART 73.4 (L) 10/07/2018 2200   HCO3 36.1 (H) 10/07/2018 2200   TCO2 38 (H) 10/07/2018 1658   O2SAT 92.9 10/07/2018 2200     Coagulation Profile: Recent Labs  Lab 10/07/18 1642 10/08/18 0636  INR 2.3* 2.4*    Cardiac Enzymes:  Recent Labs  Lab 10/08/18 0001  TROPONINI <0.03    HbA1C: Hgb A1c MFr Bld  Date/Time Value Ref Range Status  08/25/2015 02:14 AM 11.9 (H) 4.0 - 6.0 % Final    CBG: Recent Labs  Lab 10/07/18 1639 10/08/18 0107 10/08/18 0456 10/08/18 0820  GLUCAP 117* 105* 100* 105*      Past Medical History  She,  has a past medical history of A-fib (HCC), Allergic rhinitis, Bilateral primary osteoarthritis of knee, Chronic respiratory failure (HCC), COPD (chronic obstructive pulmonary disease) (HCC), Diastolic heart failure (HCC), GERD (gastroesophageal reflux disease), Gout, Hypertension, Obesity, and Sleep apnea.   Surgical History    Past Surgical History:  Procedure Laterality Date  . TONSILLECTOMY      Allergies Allergies  Allergen Reactions  . Contrast Media [Iodinated Diagnostic Agents] Swelling  . Tomato Other (See Comments)    unknow per MAR  . Amoxicillin Diarrhea, Itching and Other (See Comments)    Has patient had a PCN reaction causing immediate rash, facial/tongue/throat swelling, SOB or lightheadedness with hypotension: No Has patient had a PCN reaction causing severe rash involving mucus membranes or skin necrosis: No Has patient had a PCN reaction that required hospitalization: No Has patient had a PCN reaction occurring within the last 10 years: Yes If all of the above answers are "NO", then may proceed with Cephalosporin use.      Home Medications  Prior to Admission medications   Medication Sig Start Date End Date Taking? Authorizing Provider  acetaminophen (TYLENOL) 325 MG tablet Take 650 mg by mouth daily.   Yes [provider]  acetaminophen (TYLENOL) 325 MG tablet Take 650 mg by mouth every 4 (four) hours as needed for fever.   Yes [provider]  acetaZOLAMIDE (DIAMOX) 250 MG tablet Take 250 mg by mouth 2 (two) times daily. 02/07/18  Yes [provider]  aspirin EC 81 MG tablet Take 81 mg by mouth daily.   Yes [provider]  CARTIA XT 240 MG 24 hr capsule Take 240 mg by mouth at bedtime. 07/27/15  Yes [provider]  Cholecalciferol 25 MCG (1000 UT) tablet Take 2,000 Units by mouth daily.   Yes [provider]  Colchicine 0.6 MG CAPS Take 0.6 mg by mouth daily as needed (gout).  04/04/18  Yes [provider]  diclofenac sodium (VOLTAREN) 1 % GEL Apply 2 g topically every 12 (twelve) hours as needed (for gout apply bilateral knees for pain).   Yes [provider]  febuxostat (ULORIC) 40 MG tablet Take 40 mg by mouth daily.    Yes [provider]  furosemide (LASIX) 80 MG tablet Take 80 mg by mouth daily. afternoon   Yes [provider]  Insulin Glargine (LANTUS) 100 UNIT/ML Solostar Pen Inject 45 Units into the skin daily at 10 pm. Patient taking differently: Inject 10 Units into the skin at bedtime.  08/28/15  Yes Katharina Caper, MD  metoprolol tartrate (LOPRESSOR) 25 MG tablet Take 25 mg by mouth at bedtime.  07/25/15  Yes [provider]  nystatin (MYCOSTATIN/NYSTOP) powder Apply topically every 12 (twelve) hours as needed (yeast). Under breast   Yes [provider]  pantoprazole (PROTONIX) 40 MG tablet Take 40 mg by mouth daily. 08/18/15  Yes [provider]  potassium chloride SA (K-DUR,KLOR-CON) 20 MEQ tablet Take 60 mEq by mouth daily.  03/17/18  Yes [provider]  senna (SENOKOT) 8.6 MG tablet Take 2 tablets by mouth daily as needed for constipation.   Yes [provider]  traMADol (ULTRAM) 50 MG tablet Take 50 mg by mouth every 6 (six) hours as needed for moderate pain.   Yes [provider]  TRULICITY 1.5 MG/0.5ML SOPN Inject 1.5 mg into the skin once a week. On saturday 03/23/18  Yes [provider]  warfarin (COUMADIN) 5 MG tablet Take 5 mg by mouth See admin instructions. Take 1 tablet by mouth at bedtime every Tuesday, Thursday, Saturday and Sunday 10/05/18  Yes [provider]  warfarin (COUMADIN) 6 MG tablet Take 6 mg by mouth See admin instructions. Take 1 tablet by mouth at bedtime every Monday, Wednesday, Friday for Afib for 2 weeks. 10/06/18  Yes [provider]  isosorbide mononitrate (IMDUR)  30 MG 24 hr tablet Take 30 mg by mouth daily.    [provider]     Critical care time:      Bevelyn Ngo, Mila Palmer [facility] Pulmonary/Critical Care Pager [phone] or [phone]  10/08/2018 9:22 AM

## 2018-10-09 DIAGNOSIS — J189 Pneumonia, unspecified organism: Secondary | ICD-10-CM

## 2018-10-09 LAB — BLOOD CULTURE ID PANEL (REFLEXED)

## 2018-10-09 LAB — PROTIME-INR
INR: 2.7 — ABNORMAL HIGH (ref 0.8–1.2)
Prothrombin Time: 27.9 seconds — ABNORMAL HIGH (ref 11.4–15.2)

## 2018-10-09 LAB — GLUCOSE, CAPILLARY
Glucose-Capillary: 77 mg/dL (ref 70–99)
Glucose-Capillary: 93 mg/dL (ref 70–99)
Glucose-Capillary: 79 mg/dL (ref 70–99)

## 2018-10-09 LAB — BASIC METABOLIC PANEL
Anion gap: 5 (ref 5–15)
BUN: 24 mg/dL — ABNORMAL HIGH (ref 6–20)
CO2: 37 mmol/L — ABNORMAL HIGH (ref 22–32)
Calcium: 8.8 mg/dL — ABNORMAL LOW (ref 8.9–10.3)
Chloride: 99 mmol/L (ref 98–111)
Creatinine, Ser: 0.99 mg/dL (ref 0.44–1.00)
GFR calc Af Amer: >60 mL/min (ref >60)
GFR calc non Af Amer: >60 mL/min (ref >60)
Glucose, Bld: 100 mg/dL — ABNORMAL HIGH (ref 70–99)
Potassium: 3.8 mmol/L (ref 3.5–5.1)
Sodium: 141 mmol/L (ref 135–145)

## 2018-10-09 LAB — CBC
HCT: 37 % (ref 36.0–46.0)
Hemoglobin: 10.8 g/dL — ABNORMAL LOW (ref 12.0–15.0)
MCH: 28.3 pg (ref 26.0–34.0)
MCHC: 29.2 g/dL — ABNORMAL LOW (ref 30.0–36.0)
MCV: 97.1 fL (ref 80.0–100.0)
Platelets: 159 10*3/uL (ref 150–400)
RBC: 3.81 MIL/uL — ABNORMAL LOW (ref 3.87–5.11)
RDW: 14.6 % (ref 11.5–15.5)
WBC: 5.4 10*3/uL (ref 4.0–10.5)
nRBC: 0 % (ref 0.0–0.2)

## 2018-10-09 LAB — LEGIONELLA PNEUMOPHILA SEROGP 1 UR AG: L. pneumophila Serogp 1 Ur Ag: NEGATIVE

## 2018-10-09 LAB — INTERLEUKIN-6, PLASMA: Interleukin-6, Plasma: 17 pg/mL — ABNORMAL HIGH (ref 0.0–12.2)

## 2018-10-09 LAB — PROCALCITONIN: Procalcitonin: 0.16 ng/mL

## 2018-10-09 LAB — URINE CULTURE

## 2018-10-09 LAB — NOVEL CORONAVIRUS, NAA (HOSP ORDER, SEND-OUT TO REF LAB; TAT 18-24 HRS): SARS-CoV-2, NAA: NOT DETECTED

## 2018-10-09 LAB — HEPATITIS B SURFACE ANTIGEN: Hepatitis B Surface Ag: NEGATIVE

## 2018-10-09 MED ORDER — UMECLIDINIUM BROMIDE 62.5 MCG/INH IN AEPB: 1.0000 | INHALATION_SPRAY | Freq: Every day | RESPIRATORY_TRACT | Status: AC

## 2018-10-09 MED ORDER — DOXYCYCLINE HYCLATE 100 MG PO TABS: 100.0000 mg | ORAL_TABLET | Freq: Two times a day (BID) | ORAL | 0 refills | Status: AC

## 2018-10-09 MED ORDER — ALBUTEROL SULFATE HFA 108 (90 BASE) MCG/ACT IN AERS: 1.0000 | INHALATION_SPRAY | RESPIRATORY_TRACT | Status: AC | PRN

## 2018-10-09 MED ORDER — CEFDINIR 300 MG PO CAPS
300.0000 mg | ORAL_CAPSULE | Freq: Two times a day (BID) | ORAL | Status: DC
  Administered 2018-10-09: 300 mg via ORAL
  Filled 2018-10-09 (×2): qty 1

## 2018-10-09 MED ORDER — METOPROLOL TARTRATE 25 MG PO TABS: 25.0000 mg | ORAL_TABLET | Freq: Two times a day (BID) | ORAL | 0 refills | Status: AC

## 2018-10-09 MED ORDER — MOMETASONE FURO-FORMOTEROL FUM 200-5 MCG/ACT IN AERO: 2.0000 | INHALATION_SPRAY | Freq: Two times a day (BID) | RESPIRATORY_TRACT | Status: AC

## 2018-10-09 MED ORDER — CEFDINIR 300 MG PO CAPS: 300.0000 mg | ORAL_CAPSULE | Freq: Two times a day (BID) | ORAL | 0 refills | Status: AC

## 2018-10-09 MED ORDER — INSULIN GLARGINE 100 UNIT/ML SOLOSTAR PEN: 10.0000 [IU] | PEN_INJECTOR | Freq: Every day | SUBCUTANEOUS | Status: AC

## 2018-10-09 NOTE — Evaluation (Signed)
Occupational Therapy Evaluation Patient Details Name: Emily Fry MRN: [id number] DOB: 1956/02/14 Today's Date: 10/09/2018    History of Present Illness Pt is a 63 y/o female admitted from [facility] secondary to AMS. COVID test was negative on 10/07/18. Pt found to have Acute on chronic hypercapneic and hypoxemic respiratory failure. PMH including but not limited to atrial fibrillation, gout, COPD on 2L oxygen, OSA, morbid obesity, HFpEF and CKD III.   Clinical Impression   Pt performing bed mobility as pt was ready for d/c. Pt maxA +2 for rolling and L knee with pain with any movement. Pt set-upA for feeding, minA for grooming and totalA for most other ADL tasks. Pt mostly bedbound at SNF and does not mind living in the bed. Pt would benefit from continued OT skilled services for ADL, mobility and safety in SNF setting. OT to follow acutely.      Follow Up Recommendations  SNF;Supervision - Intermittent    Equipment Recommendations  None recommended by OT    Recommendations for Other Services       Precautions / Restrictions Precautions Precautions: Fall Restrictions Weight Bearing Restrictions: No      Mobility Bed Mobility Overal bed mobility: Needs Assistance Bed Mobility: Rolling Rolling: Max assist;+2 for physical assistance         General bed mobility comments: pt able to assist minimall with R UE on bed rail  Transfers                 General transfer comment: deferred as transport had arrived to take pt back to her SNF    Balance                                           ADL either performed or assessed with clinical judgement   ADL Overall ADL's : At baseline                                       General ADL Comments: Pt hyer lift and assisted with all ADLs preferring to stay in bed.     Vision Baseline Vision/History: No visual deficits Vision Assessment?: No apparent visual deficits     Perception      Praxis      Pertinent Vitals/Pain Pain Assessment: Faces Faces Pain Scale: Hurts even more Pain Location: L knee and L shoulder with movement Pain Descriptors / Indicators: Grimacing;Guarding;Sore Pain Intervention(s): Monitored during session     Hand Dominance     Extremity/Trunk Assessment Upper Extremity Assessment Upper Extremity Assessment: Defer to OT evaluation   Lower Extremity Assessment Lower Extremity Assessment: Defer to PT evaluation;RLE deficits/detail RLE Deficits / Details: pt with grossly 2/5 throughout; difficulty to full assess due to body habitus obstructing mobility LLE Deficits / Details: pt with grossly 2/5 throughout; difficulty to full assess due to body habitus obstructing mobility LLE: Unable to fully assess due to pain   Cervical / Trunk Assessment Cervical / Trunk Assessment: Other exceptions Cervical / Trunk Exceptions: body habitus   Communication Communication Communication: No difficulties   Cognition Arousal/Alertness: Awake/alert Behavior During Therapy: WFL for tasks assessed/performed Overall Cognitive Status: Within Functional Limits for tasks assessed  General Comments: cognition not formally assessed but [facility] for general conversation   General Comments       Exercises     Shoulder Instructions      Home Living Family/patient expects to be discharged to:: Skilled nursing facility                                        Prior Functioning/Environment Level of Independence: Needs assistance  Gait / Transfers Assistance Needed: pt reported that she stays in the bed all day everyday  ADL's / Homemaking Assistance Needed: requires assistance from staff            OT Problem List: Decreased strength;Decreased activity tolerance;Impaired balance (sitting and/or standing);Decreased safety awareness;Pain      OT Treatment/Interventions:      OT Goals(Current goals  can be found in the care plan section) Acute Rehab OT Goals Patient Stated Goal: to walk  OT Frequency:     Barriers to D/C:            Co-evaluation PT/OT/SLP Co-Evaluation/Treatment: Yes Reason for Co-Treatment: To address functional/ADL transfers PT goals addressed during session: Mobility/safety with mobility;Strengthening/ROM OT goals addressed during session: ADL's and self-care      AM-PAC OT "6 Clicks" Daily Activity     Outcome Measure Help from another person eating meals?: None Help from another person taking care of personal grooming?: A Little Help from another person toileting, which includes using toliet, bedpan, or urinal?: Total Help from another person bathing (including washing, rinsing, drying)?: A Lot Help from another person to put on and taking off regular upper body clothing?: A Lot Help from another person to put on and taking off regular lower body clothing?: Total 6 Click Score: 13   End of Session Nurse Communication: Mobility status  Activity Tolerance: Patient tolerated treatment well Patient left: in bed;with call bell/phone within reach  OT Visit Diagnosis: Unsteadiness on feet (R26.81);Muscle weakness (generalized) (M62.81)                Time: 7793-9030 OT Time Calculation (min): 14 min Charges:  OT General Charges $OT Visit: 1 Visit OT Evaluation $OT Eval Low Complexity: 1 Low  Cristi Loron) Glendell Docker OTR/L Acute Rehabilitation Services Pager: [phone] Office: [phone]   Lonzo Cloud 10/09/2018, 4:52 PM

## 2018-10-09 NOTE — NC FL2 (Signed)
[place] MEDICAID FL2 LEVEL OF CARE SCREENING TOOL     IDENTIFICATION  Patient Name: Emily Fry Birthdate: [birth year] Sex: female Admission Date (Current Location): 10/07/2018  [facility] and [place] Number:  Producer, television/film/video and Address:  [facility] [place], [address] [place], [place] [address]      Provider Number: [id number]  Attending Physician Name and Address:  Tobey Grim, MD  Relative Name and Phone Number:       Current Level of Care: Hospital Recommended Level of Care: Skilled Nursing Facility Prior Approval Number:    Date Approved/Denied:   PASRR Number:    Discharge Plan: SNF    Current Diagnoses: Patient Active Problem List   Diagnosis Date Noted  . Altered mental state 10/07/2018  . A-fib (HCC) 10/07/2018  . COPD (chronic obstructive pulmonary disease) (HCC) 10/07/2018  . Gout 10/07/2018  . Benign essential HTN 10/07/2018  . OSA (obstructive sleep apnea) 10/07/2018  . Diastolic heart failure (HCC) 10/07/2018  . GERD (gastroesophageal reflux disease) 10/07/2018  . Hypoxia 04/14/2018  . Hyponatremia 08/28/2015  . Hypokalemia 08/28/2015  . 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    Orientation RESPIRATION BLADDER Height & Weight     Time, Self, Situation, Place  O2(Nasal Cannula 2.5) External catheter, Incontinent(placed 4/29) Weight: (!) 332 lb (150.6 kg) Height:  5\' 4"  (162.6 cm)  BEHAVIORAL SYMPTOMS/MOOD NEUROLOGICAL BOWEL NUTRITION STATUS      Continent Diet(heart healthy/carb modified; Fluid restriction: 1500 mL Fluid)  AMBULATORY STATUS COMMUNICATION OF NEEDS Skin   Limited Assist Verbally Normal                       Personal Care Assistance Level of Assistance  Dressing, Feeding, Bathing Bathing Assistance: Limited assistance Feeding assistance: Independent Dressing Assistance: Limited assistance      Functional Limitations Info  Sight, Hearing, Speech Sight Info: Adequate Hearing Info: Adequate Speech Info: Adequate    SPECIAL CARE FACTORS FREQUENCY  PT (By licensed PT), OT (By licensed OT)     PT Frequency: 5x OT Frequency: 5x            Contractures Contractures Info: Not present    Additional Factors Info  Code Status, Allergies Code Status Info: Full Code Allergies Info: heart healthy/carb modified Room service appropriate? Yes; Fluid consistency: Thin; Fluid restriction: 1500 mL Fluid           Current Medications (10/09/2018):  This is the current hospital active medication list Current Facility-Administered Medications  Medication Dose Route Frequency Provider Last Rate Last Dose  . acetaminophen (TYLENOL) tablet 650 mg  650 mg Oral Q6H PRN Westley Chandler, MD      . albuterol (VENTOLIN HFA) 108 (90 Base) MCG/ACT inhaler 1-2 puff  1-2 puff Inhalation Q3H PRN Oralia Manis, DO      . aspirin EC tablet 81 mg  81 mg Oral Daily Melene Plan, MD   81 mg at 10/09/18 1194  . cefTRIAXone (ROCEPHIN) 1 g in sodium chloride 0.9 % 100 mL IVPB  1 g Intravenous Q24H Lezlie Octave C, MD 200 mL/hr at 10/08/18 1356 1 g at 10/08/18 1356  . diltiazem (CARDIZEM CD) 24 hr capsule 240 mg  240 mg Oral QHS Melene Plan, MD   240 mg at 10/08/18 2201  . doxycycline (VIBRA-TABS) tablet 100 mg  100 mg Oral Q12H Winfrey, Harlen Labs, MD   100  mg at 10/09/18 0927  . furosemide (LASIX) tablet 80 mg  80 mg Oral Daily Lennox Solders, MD   80 mg at 10/09/18 6147  . insulin aspart (novoLOG) injection 0-20 Units  0-20 Units Subcutaneous TID WC Garnette Gunner, MD      . insulin glargine (LANTUS) injection 10 Units  10 Units Subcutaneous Daily Lennox Solders, MD   10 Units at 10/09/18 [phone]  . metoprolol tartrate (LOPRESSOR) tablet 25 mg  25 mg Oral BID Lennox Solders, MD   25 mg at 10/09/18 5747  . mometasone-formoterol (DULERA) 200-5 MCG/ACT inhaler 2 puff  2 puff Inhalation BID  Luciano Cutter, MD   2 puff at 10/09/18 [phone]  . pantoprazole (PROTONIX) EC tablet 40 mg  40 mg Oral Daily Melene Plan, MD   40 mg at 10/09/18 7096  . senna (SENOKOT) tablet 17.2 mg  2 tablet Oral Daily PRN Melene Plan, MD   17.2 mg at 10/08/18 1150  . umeclidinium bromide (INCRUSE ELLIPTA) 62.5 MCG/INH 1 puff  1 puff Inhalation Daily Lore, Melissa A, RPH   1 puff at 10/09/18 0935  . warfarin (COUMADIN) tablet 5 mg  5 mg Oral Q T,Th,S,Su-1800 Lore, Melissa A, RPH      . warfarin (COUMADIN) tablet 6 mg  6 mg Oral Q M,W,F-1800 Lore, Melissa A, RPH   6 mg at 10/08/18 1721  . Warfarin - Pharmacist Dosing Inpatient   Does not apply q1800 Titus Mould, [facility]         Discharge Medications: Please see discharge summary for a list of discharge medications.  Relevant Imaging Results:  Relevant Lab Results:   Additional Information SSN: [id number]  Maree Krabbe, LCSW

## 2018-10-09 NOTE — TOC Transition Note (Signed)
Transition of Care [facility]) - CM/SW Discharge Note   Patient Details  Name: Emily Fry MRN: [id number] Date of Birth: 1955/09/30  [facility] CM/SW Contact:  Maree Krabbe, LCSW Phone Number: 10/09/2018, 1:23 PM   Clinical Narrative:  Clinical Social Worker facilitated patient discharge including contacting patient family and facility to confirm patient discharge plans.  Clinical information faxed to facility and family agreeable with plan.  CSW arranged ambulance transport via [facility] to [facility] (room 204).  RN to call [phone] for report prior to discharge.        Final next level of care: Skilled Nursing Facility Barriers to Discharge: No Barriers Identified   Patient Goals and CMS Choice Patient states their goals for this hospitalization and ongoing recovery are:: " to get well enough to return home"   Choice offered to / list presented to : Patient  Discharge Placement   Existing PASRR number confirmed : 10/09/18          Patient chooses bed at: [facility] [place] Patient to be transferred to facility by: [facility] Name of family member notified: Pt alert and oriented Patient and family notified of of transfer: 10/09/18  Discharge Plan and Services In-house Referral: Clinical Social Work Discharge Planning Services: NA Post Acute Care Choice: Nursing Home          DME Arranged: N/A DME Agency: NA       HH Arranged: NA HH Agency: NA        Social Determinants of Health (SDOH) Interventions     Readmission Risk Interventions Readmission Risk Prevention Plan 10/09/2018  Transportation Screening Complete  PCP or Specialist Appt within 3-5 Days Complete  HRI or Home Care Consult Complete  Social Work Consult for Recovery Care Planning/Counseling Complete  Palliative Care Screening Not Applicable  Medication Review Oceanographer) Complete  Some recent data might be hidden

## 2018-10-09 NOTE — TOC Initial Note (Signed)
Transition of Care [facility]) - Initial/Assessment Note    Patient Details  Name: Emily Fry MRN: [id number] Date of Birth: Oct 28, 1955  Transition of Care [facility]) CM/SW Contact:    Maree Krabbe, LCSW Phone Number: 10/09/2018, 10:14 AM  Clinical Narrative:  Pt is alert and oriented. Pt confirmed she came from [facility] and she would like to return there at d/c. [facility] confirmed pt can return at d/c. Pt was living alone prior to SNF. Pt states her goal is to get well enough to return home. Pt has a sister who is a support. Pt has transportation outside of hospital however will need PTAR to SNF. Pt has no difficulty getting medications. Pt has a PCP.   High Risk Readmission Screening completed. CSW will continue to facilitate SNF d/c.     Expected Discharge Plan: Skilled Nursing Facility Barriers to Discharge: Continued Medical Work up   Patient Goals and CMS Choice Patient states their goals for this hospitalization and ongoing recovery are:: " to get well enough to return home"   Choice offered to / list presented to : Patient  Expected Discharge Plan and Services Expected Discharge Plan: Skilled Nursing Facility In-house Referral: Clinical Social Work Discharge Planning Services: NA Post Acute Care Choice: Nursing Home Living arrangements for the past 2 months: Single Family Home, Skilled Nursing Facility                 DME Arranged: N/A DME Agency: NA       HH Arranged: NA HH Agency: NA        Prior Living Arrangements/Services Living arrangements for the past 2 months: Single Family Home, Skilled Nursing Facility Lives with:: Self Patient language and need for interpreter reviewed:: Yes Do you feel safe going back to the place where you live?: Yes      Need for Family Participation in Patient Care: No (Comment)(pt going to snf) Care giver support system in place?: Yes (comment)(sister)   Criminal Activity/Legal Involvement Pertinent to Current  Situation/Hospitalization: No - Comment as needed  Activities of Daily Living Home Assistive Devices/Equipment: CPAP ADL Screening (condition at time of admission) Patient's cognitive ability adequate to safely complete daily activities?: Yes Is the patient deaf or have difficulty hearing?: No Does the patient have difficulty seeing, even when wearing glasses/contacts?: No Does the patient have difficulty concentrating, remembering, or making decisions?: Yes Patient able to express need for assistance with ADLs?: Yes Does the patient have difficulty dressing or bathing?: Yes Independently performs ADLs?: No Communication: Independent Dressing (OT): Dependent Is this a change from baseline?: Pre-admission baseline Grooming: Dependent Is this a change from baseline?: Pre-admission baseline Feeding: Independent Bathing: Dependent Is this a change from baseline?: Pre-admission baseline Toileting: Dependent Is this a change from baseline?: Pre-admission baseline In/Out Bed: Dependent Is this a change from baseline?: Pre-admission baseline Walks in Home: Dependent Is this a change from baseline?: Pre-admission baseline Does the patient have difficulty walking or climbing stairs?: Yes Weakness of Legs: Both Weakness of Arms/Hands: Both  Permission Sought/Granted Permission sought to share information with : Facility Medical sales representative, Family Supports    Share Information with NAME: Kiosha Chrysler  Permission granted to share info w AGENCY: [facility]  Permission granted to share info w Relationship: Sister  Permission granted to share info w Contact Information: [phone]  Emotional Assessment Appearance:: Appears stated age Attitude/Demeanor/Rapport: (pt was appropriate) Affect (typically observed): Accepting, Appropriate, Calm Orientation: : Oriented to Place, Oriented to  Time, Oriented to Situation,  Oriented to Self Alcohol / Substance Use: Not Applicable Psych  Involvement: No (comment)  Admission diagnosis:  Tremor [R25.1] HCAP (healthcare-associated pneumonia) [J18.9] Patient Active Problem List   Diagnosis Date Noted  . Altered mental state 10/07/2018  . A-fib (HCC) 10/07/2018  . COPD (chronic obstructive pulmonary disease) (HCC) 10/07/2018  . Gout 10/07/2018  . Benign essential HTN 10/07/2018  . OSA (obstructive sleep apnea) 10/07/2018  . Diastolic heart failure (HCC) 10/07/2018  . GERD (gastroesophageal reflux disease) 10/07/2018  . Hypoxia 04/14/2018  . Hyponatremia 08/28/2015  . Hypokalemia 08/28/2015  . 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

## 2018-10-09 NOTE — Progress Notes (Signed)
PHARMACY - PHYSICIAN COMMUNICATION CRITICAL VALUE ALERT - BLOOD CULTURE IDENTIFICATION (BCID)  Emily Fry is an 63 y.o. female who presented to [facility] on 10/07/2018 with AMS 2/2 hypercarbic respiratory failure that has resolved, on ceftriaxone and doxycycline for pna.  AF, WBC 6.8, VSS.  Assessment:  1/4 coag negative staph, likely contaminant   Name of physician (or Provider) Contacted: Constance Goltz  Current antibiotics: ceftriaxone, doxycycline  Changes to prescribed antibiotics recommended:  None  No results found for this or any previous visit.  [name] 10/09/2018  3:55 AM

## 2018-10-09 NOTE — Progress Notes (Addendum)
Family Medicine Teaching Service Daily Progress Note Intern Pager: [phone]  Patient name: Emily Fry Medical record number: [id number] Date of birth: 1955/06/14 Age: 63 y.o. Gender: female  Primary Care Provider: Lauro Regulus, MD Consultants: CCM (In ED) Code Status: Full  Pt Overview and Major Events to Date:  Emily Fry is a 63 y.o. female admitted for tremor, cough, from [facility] where patient has lived for the last year. Her chronic conditions include DM II, OSA, obesity, HTN, Gout, COPD w/ chronic respiratory failure (on 2L O2), and a fib on Coumadin  Assessment and Plan:  #AMS secondary to Acute Hypercarbic Respiratory Failure (improved) AMS resolved this morning.  Patient reports inability to use CPAP at SNF.  This likely led to CO2 retention as evidenced by admission ABG w/ pH 7.1 and pCO2 > 100. Patient remains alert and oriented x4 this a.m.  She denies any further "arm twitching," was likely 2/2 hypercarbia.  Seizures much less likely given this information, but cannot r/o.    Consider EEG to r/o siezure   Patient stable for discharge   #PUI for COVID-19: Negative x2 Initially concerning due to altered mental status and findings on chest x-ray.  However given rapid improvement with supplemental oxygen, greatly reduces likelihood of any infection, including COVID-19.  COVID negative and send-out negative. - d/c precautions  # COPD # Chronic Respiratory Failure  # OSA #?CAP On 2 L of oxygen at baseline. Wears CPAP qhs, normally, but has been w/o due to SNF facility  Patient does not have any controller medications on her medication list. Patient also new left lung opacity on CXR. COVID neg x2.  Can restart CPAP QHS as is COVID negative.  Continuous O2  Continuous pulse ox  Cont doxycycline x 7 days total (4/29- )  Transition CTX (4/29) to cefdinir x 7 days total  #HFpEF # HTN Last EF listed in 2010 was >55% at [facility]. At home, patient on  Lasix 80 mg, metoprolol 25 mg, Imdur 30 mg, Cartia XT 240 mg. Patient's med list from SNF shoes acetazolamide, per SNF nurse, she is in this for CHF.  Would opt to d/c on discharge and cont with Lasix.  Continue home metoprolol and Cartia for continued rate control  Cont home Lasix 80mg  QD  Would opt to not restart acetazolamide on discharge  In outpatient setting, consider d/c'ing diltiazem if can tolerate due to increased mortality associated with, if ejection fraction worsens  #A fib On Coumadin at home.  INR 2.7.  Overnight Sinus Tach to 105, EKG this AM NSR.  HR this AM 85. 1st degree AV block noted on telemetry, not seen on EKG this AM.  Continue Cartia and metoprolol for rate control  Coumadin per pharmacy  Continuous cardiac monitoring  #CAD #Hx of MI  Continue home ASA  # OSA Does not wear CPAP at home, but does wear O2 at night.    Continue night O2  # Gout At home, on februxustat scheduled and PRN colchicine    Discontinue daily febuxostat given new black box warning  Colchicine prophylaxis  # OA BL Knees At home, tramadol 50mg  q 6 hours PRN pain    Continue tramadol 50 mg every 6 hours as needed pain  Hold tramadol if patient with altered mental status  #DM II Patient tolerating PO this AM. Patient takes 10u Lantus at home and received 23u yesterday with CBG of 77 overnight.  This AM 93.  CBG every 4 hours  SSIr + CBGs TID w/ meals  QHS insulin sliding scale  # CKD 3 Cr this AM pending. Baseline GFR is 50s and creatinine 1.2.  Patient at baseline kidney function  Hold nephrotoxic medications  Monitor Cr  #Morbid Obesity, BMI 56.99 Limited mobility   OOB with assistance only   PT/OT  #FEN/GI:   Fluids: KVO  Electrolytes: Within normal limits  Nutrition: HHD/Carb modified  Access: L PIV VTE prophylaxis: Coumadin  Disposition: Transfer to non-COVID 2W on cardiac telemetry, likely back to SNF today  Subjective:   Patient states that she is feeling well.  Breathing comfortably.  Denies further arm twitching.  States that she would like to be discharged today.  Objective: Temp:  [97.5 F (36.4 C)-98.1 F (36.7 C)] 98.1 F (36.7 C) (04/30 0500) Pulse Rate:  [79-105] 79 (04/30 0500) Resp:  [14-23] 23 (04/30 0500) BP: (96-134)/(46-96) 96/58 (04/30 0500) SpO2:  [91 %-96 %] 95 % (04/30 0500)  Physical Exam: General: 63 y.o. female in NAD Cardio: irregularly irregular Lungs: CTAB, no increased WOB on 3L per [place] Abdomen: Soft, non-tender to palpation, positive bowel sounds Skin: warm and dry Extremities: No edema   Laboratory: Recent Labs  Lab 10/07/18 1642 10/07/18 1658 10/08/18 0636  WBC 8.6  --  6.8  HGB 12.0 12.9 10.9*  HCT 41.8 38.0 38.9  PLT 184  --  149*   Recent Labs  Lab 10/07/18 1642 10/07/18 1658 10/08/18 0636  NA 144 143 142  K 4.5 4.4 4.3  CL 101  --  104  CO2 35*  --  30  BUN 28*  --  27*  CREATININE 1.18*  --  1.08*  CALCIUM 9.4  --  8.9  PROT 6.6  --  5.9*  BILITOT 0.4  --  0.6  ALKPHOS 141*  --  119  ALT 20  --  17  AST 20  --  18  GLUCOSE 140*  --  124*    Imaging/Diagnostic Tests: Ct Head Wo Contrast  Result Date: 10/07/2018 CLINICAL DATA:  Seizure.  Bilateral hand twitching EXAM: CT HEAD WITHOUT CONTRAST TECHNIQUE: Contiguous axial images were obtained from the base of the skull through the vertex without intravenous contrast. COMPARISON:  None. FINDINGS: Brain: Ventricle size and cerebral volume normal. Negative for acute infarct. Negative for hemorrhage or mass. Vascular: Negative for hyperdense vessel Skull: Negative Sinuses/Orbits: Bilateral exophthalmos. No orbital mass. Paranasal sinuses clear. Other: None IMPRESSION: No acute intracranial abnormality Exophthalmos. Electronically Signed   By: Marlan Palau M.D.   On: 10/07/2018 17:10   Dg Chest Portable 1 View  Result Date: 10/07/2018 CLINICAL DATA:  Tremor.  Cough. EXAM: PORTABLE CHEST 1 VIEW  COMPARISON:  04/14/2018 and 05/17/2008 FINDINGS: There is abnormal increased density at the left lung base, new since the prior study. Heart size and pulmonary vascularity are normal. There is a new small focal area of linear atelectasis in the right upper lobe. No effusions. IMPRESSION: Area of new infiltrate at the left lung base. Minimal atelectasis in the right upper lobe. Electronically Signed   By: Francene Boyers M.D.   On: 10/07/2018 17:14    Meccariello, Solmon Ice, DO 10/09/2018, 6:57 AM PGY-1, [facility] FPTS Intern pager: [phone], text pages welcome

## 2018-10-09 NOTE — Progress Notes (Signed)
CBG at 0200 77. Snack given

## 2018-10-09 NOTE — Evaluation (Signed)
Physical Therapy Evaluation & Discharge Patient Details Name: Emily Fry MRN: [id number] DOB: Dec 02, 1955 Today's Date: 10/09/2018   History of Present Illness  Pt is a 63 y/o female admitted from [facility] secondary to [place]. COVID test was negative on 10/07/18. Pt found to have Acute on chronic hypercapneic and hypoxemic respiratory failure. PMH including but not limited to atrial fibrillation, gout, COPD on 2L oxygen, OSA, morbid obesity, HFpEF and CKD III.  Clinical Impression  Pt presented supine in bed with HOB elevated, awake and willing to participate in therapy session. Prior to admission, pt reported that she stays in bed at the SNF and requires assistance for ADLs. At the time of evaluation, pt required heavy physical assistance of two for bed mobility. Pt would continue to benefit from skilled physical therapy services at this time while admitted and after d/c to address the below listed limitations in order to improve overall safety and independence with functional mobility.     Follow Up Recommendations SNF    Equipment Recommendations  None recommended by PT    Recommendations for Other Services       Precautions / Restrictions Precautions Precautions: Fall Restrictions Weight Bearing Restrictions: No      Mobility  Bed Mobility Overal bed mobility: Needs Assistance Bed Mobility: Rolling Rolling: Max assist;+2 for physical assistance         General bed mobility comments: pt able to assist [place] with R UE on bed rail  Transfers                 General transfer comment: deferred as transport had arrived to take pt back to her SNF  Ambulation/Gait                Stairs            Wheelchair Mobility    Modified Rankin (Stroke Patients Only)       Balance                                             Pertinent Vitals/Pain Pain Assessment: Faces Faces Pain Scale: Hurts even more Pain Location: L knee and L  shoulder with movement Pain Descriptors / Indicators: Grimacing;Guarding;Sore Pain Intervention(s): Monitored during session    Home Living Family/patient expects to be discharged to:: Skilled nursing facility                      Prior Function Level of Independence: Needs assistance   Gait / Transfers Assistance Needed: pt reported that she stays in the bed all day everyday   ADL's / Homemaking Assistance Needed: requires assistance from staff        Hand Dominance        Extremity/Trunk Assessment   Upper Extremity Assessment Upper Extremity Assessment: Defer to OT evaluation    Lower Extremity Assessment Lower Extremity Assessment: RLE deficits/detail;LLE deficits/detail RLE Deficits / Details: pt with grossly 2/5 throughout; difficulty to full assess due to body habitus obstructing mobility LLE Deficits / Details: pt with grossly 2/5 throughout; difficulty to full assess due to body habitus obstructing mobility LLE: Unable to fully assess due to pain    Cervical / Trunk Assessment Cervical / Trunk Assessment: Other exceptions Cervical / Trunk Exceptions: body habitus  Communication   Communication: No difficulties  Cognition Arousal/Alertness: Awake/alert Behavior During Therapy: WFL for tasks assessed/performed  Overall Cognitive Status: Within Functional Limits for tasks assessed                                 General Comments: cognition not formally assessed but [facility] for general conversation      General Comments      Exercises     Assessment/Plan    PT Assessment All further PT needs can be met in the next venue of care  PT Problem List Decreased strength;Decreased range of motion;Decreased activity tolerance;Decreased balance;Decreased coordination;Decreased mobility;Decreased knowledge of use of DME;Decreased safety awareness;Decreased knowledge of precautions;Pain       PT Treatment Interventions      PT Goals (Current  goals can be found in the Care Plan section)  Acute Rehab PT Goals Patient Stated Goal: to walk PT Goal Formulation: All assessment and education complete, DC therapy    Frequency     Barriers to discharge        Co-evaluation PT/OT/SLP Co-Evaluation/Treatment: Yes Reason for Co-Treatment: To address functional/ADL transfers;For patient/therapist safety PT goals addressed during session: Mobility/safety with mobility;Strengthening/ROM         AM-PAC PT "6 Clicks" Mobility  Outcome Measure Help needed turning from your back to your side while in a flat bed without using bedrails?: Total Help needed moving from lying on your back to sitting on the side of a flat bed without using bedrails?: Total Help needed moving to and from a bed to a chair (including a wheelchair)?: Total Help needed standing up from a chair using your arms (e.g., wheelchair or bedside chair)?: Total Help needed to walk in hospital room?: Total Help needed climbing 3-5 steps with a railing? : Total 6 Click Score: 6    End of Session   Activity Tolerance: Patient limited by fatigue Patient left: in bed;with call bell/phone within reach Nurse Communication: Mobility status PT Visit Diagnosis: Other abnormalities of gait and mobility (R26.89);Muscle weakness (generalized) (M62.81)    Time: 9050-2561 PT Time Calculation (min) (ACUTE ONLY): 14 min   Charges:   PT Evaluation $PT Eval Moderate Complexity: 1 Mod          Sherie Don, PT, DPT  Acute Rehabilitation Services Pager [phone] Office [facility] 10/09/2018, 3:31 PM

## 2018-10-09 NOTE — Progress Notes (Signed)
ANTICOAGULATION CONSULT NOTE  Pharmacy Consult for Coumadin Indication: atrial fibrillation  Allergies  Allergen Reactions  . Contrast Media [Iodinated Diagnostic Agents] Swelling  . Tomato Other (See Comments)    unknow per MAR  . Amoxicillin Diarrhea, Itching and Other (See Comments)    Has patient had a PCN reaction causing immediate rash, facial/tongue/throat swelling, SOB or lightheadedness with hypotension: No Has patient had a PCN reaction causing severe rash involving mucus membranes or skin necrosis: No Has patient had a PCN reaction that required hospitalization: No Has patient had a PCN reaction occurring within the last 10 years: Yes If all of the above answers are "NO", then may proceed with Cephalosporin use.     Patient Measurements: Height: 5\' 4"  (162.6 cm) Weight: (!) 332 lb (150.6 kg) IBW/kg (Calculated) : 54.7  Vital Signs: Temp: 98.1 F (36.7 C) (04/30 0500) Temp Source: Oral (04/30 0500) BP: 110/68 (04/30 0700) Pulse Rate: 85 (04/30 0700)  Labs: Recent Labs    10/07/18 1642 10/07/18 1658 10/08/18 0001 10/08/18 0636 10/09/18 0502  HGB 12.0 12.9  --  10.9*  --   HCT 41.8 38.0  --  38.9  --   PLT 184  --   --  149*  --   LABPROT 24.7*  --   --  25.5* 27.9*  INR 2.3*  --   --  2.4* 2.7*  CREATININE 1.18*  --   --  1.08*  --   TROPONINI  --   --  <0.03  --   --     Estimated Creatinine Clearance: 81.4 mL/min (A) (by C-G formula based on SCr of 1.08 mg/dL (H)).   Medical History: Past Medical History:  Diagnosis Date  . A-fib (HCC)   . Allergic rhinitis   . Bilateral primary osteoarthritis of knee   . Chronic respiratory failure (HCC)    on 2L home o2  . COPD (chronic obstructive pulmonary disease) (HCC)   . Diastolic heart failure (HCC)   . GERD (gastroesophageal reflux disease)   . Gout   . Hypertension   . Obesity   . Sleep apnea     Medications:  Medications Prior to Admission  Medication Sig Dispense Refill Last Dose  .  acetaminophen (TYLENOL) 325 MG tablet Take 650 mg by mouth daily.   10/07/2018 at 0900  . acetaminophen (TYLENOL) 325 MG tablet Take 650 mg by mouth every 4 (four) hours as needed for fever.   unk  . acetaZOLAMIDE (DIAMOX) 250 MG tablet Take 250 mg by mouth 2 (two) times daily.  1 10/07/2018 at 0900  . aspirin EC 81 MG tablet Take 81 mg by mouth daily.   10/07/2018 at 0900  . CARTIA XT 240 MG 24 hr capsule Take 240 mg by mouth at bedtime.  0 10/06/2018 at 2100  . Cholecalciferol 25 MCG (1000 UT) tablet Take 2,000 Units by mouth daily.   10/07/2018 at 0900  . Colchicine 0.6 MG CAPS Take 0.6 mg by mouth daily as needed (gout).   0 unk  . diclofenac sodium (VOLTAREN) 1 % GEL Apply 2 g topically every 12 (twelve) hours as needed (for gout apply bilateral knees for pain).   unk  . febuxostat (ULORIC) 40 MG tablet Take 40 mg by mouth daily.   1 10/07/2018 at 0900  . furosemide (LASIX) 80 MG tablet Take 80 mg by mouth daily. afternoon  1 10/07/2018 at 1200  . Insulin Glargine (LANTUS) 100 UNIT/ML Solostar Pen Inject 45 Units into  the skin daily at 10 pm. (Patient taking differently: Inject 10 Units into the skin at bedtime. ) 15 mL 11 10/06/2018 at 2100  . metoprolol tartrate (LOPRESSOR) 25 MG tablet Take 25 mg by mouth at bedtime.   0 10/06/2018 at 2100  . nystatin (MYCOSTATIN/NYSTOP) powder Apply topically every 12 (twelve) hours as needed (yeast). Under breast   unk  . pantoprazole (PROTONIX) 40 MG tablet Take 40 mg by mouth daily.  11 10/07/2018 at 0630  . potassium chloride SA (K-DUR,KLOR-CON) 20 MEQ tablet Take 60 mEq by mouth daily.   0 10/07/2018 at 0900  . senna (SENOKOT) 8.6 MG tablet Take 2 tablets by mouth daily as needed for constipation.   09/20/2018  . traMADol (ULTRAM) 50 MG tablet Take 50 mg by mouth every 6 (six) hours as needed for moderate pain.   09/13/2018  . TRULICITY 1.5 MG/0.5ML SOPN Inject 1.5 mg into the skin once a week. On saturday  11 09/27/2018  . warfarin (COUMADIN) 5 MG tablet Take 5 mg  by mouth See admin instructions. Take 1 tablet by mouth at bedtime every Tuesday, Thursday, Saturday and Sunday   10/04/2018 at 2100  . warfarin (COUMADIN) 6 MG tablet Take 6 mg by mouth See admin instructions. Take 1 tablet by mouth at bedtime every Monday, Wednesday, Friday for Afib for 2 weeks.   10/06/2018 at 2100  . isosorbide mononitrate (IMDUR) 30 MG 24 hr tablet Take 30 mg by mouth daily.   10/07/2018 at 1130    Assessment: 63 y.o. F admitted with PNA. Pt on Coumadin PTA for afib. Admission INR 2.3 (therapeutic).  Home dose: 5mg  T/T/S/S and 6mg  M/W/F. Last dose 4/27 PM.  INR remains therapeutic today at 2.7. No CBC today. Noted that patient was started on doxycycline today for PNA which can increase INR. Will adjust Coumadin as needed and continue with home Coumadin dose for now. Of note, patient did not get Coumadin dose 4/28.  Goal of Therapy:  INR 2-3 Monitor platelets by anticoagulation protocol: Yes   Plan:  Continue home coumadin 5mg  T/T/S/S and 6mg  M/W/F Daily INR  Arvilla Market, PharmD PGY1 Pharmacy Resident Phone [phone] 10/09/2018     7:45 AM

## 2018-10-09 NOTE — Discharge Instructions (Signed)
Emily Fry was admitted to the hospital and found to have community-acquired pneumonia.  She will remain on antibiotics until 5/5 to complete a total of 7 days.    Community-Acquired Pneumonia, Adult Pneumonia is an infection of the lungs. It causes swelling in the airways of the lungs. Mucus and fluid may also build up inside the airways. One type of pneumonia can happen while a person is in a hospital. A different type can happen when a person is not in a hospital (community-acquired pneumonia).  What are the causes?  This condition is caused by germs (viruses, bacteria, or fungi). Some types of germs can be passed from one person to another. This can happen when you breathe in droplets from the cough or sneeze of an infected person. What increases the risk? You are more likely to develop this condition if you:  Have a long-term (chronic) disease, such as: ? Chronic obstructive pulmonary disease (COPD). ? Asthma. ? Cystic fibrosis. ? Congestive heart failure. ? Diabetes. ? Kidney disease.  Have HIV.  Have sickle cell disease.  Have had your spleen removed.  Do not take good care of your teeth and mouth (poor dental hygiene).  Have a medical condition that increases the risk of breathing in droplets from your own mouth and nose.  Have a weakened body defense system (immune system).  Are a smoker.  Travel to areas where the germs that cause this illness are common.  Are around certain animals or the places they live. What are the signs or symptoms?  A dry cough.  A wet (productive) cough.  Fever.  Sweating.  Chest pain. This often happens when breathing deeply or coughing.  Fast breathing or trouble breathing.  Shortness of breath.  Shaking chills.  Feeling tired (fatigue).  Muscle aches. How is this treated? Treatment for this condition depends on many things. Most adults can be treated at home. In some cases, treatment must happen in a hospital.  Treatment may include:  Medicines given by mouth or through an IV tube.  Being given extra oxygen.  Respiratory therapy. In rare cases, treatment for very bad pneumonia may include:  Using a machine to help you breathe.  Having a procedure to remove fluid from around your lungs. Follow these instructions at home: Medicines  Take over-the-counter and prescription medicines only as told by your doctor. ? Only take cough medicine if you are losing sleep.  If you were prescribed an antibiotic medicine, take it as told by your doctor. Do not stop taking the antibiotic even if you start to feel better. General instructions   Sleep with your head and neck raised (elevated). You can do this by sleeping in a recliner or by putting a few pillows under your head.  Rest as needed. Get at least 8 hours of sleep each night.  Drink enough water to keep your pee (urine) pale yellow.  Eat a healthy diet that includes plenty of vegetables, fruits, whole grains, low-fat dairy products, and lean protein.  Do not use any products that contain nicotine or tobacco. These include cigarettes, e-cigarettes, and chewing tobacco. If you need help quitting, ask your doctor.  Keep all follow-up visits as told by your doctor. This is important. How is this prevented? A shot (vaccine) can help prevent pneumonia. Shots are often suggested for:  People older than 61 years of age.  People older than 61 years of age who: ? Are having cancer treatment. ? Have long-term (chronic) lung disease. ?  Have problems with their body's defense system. You may also prevent pneumonia if you take these actions:  Get the flu (influenza) shot every year.  Go to the dentist as often as told.  Wash your hands often. If you cannot use soap and water, use hand sanitizer. Contact a doctor if:  You have a fever.  You lose sleep because your cough medicine does not help. Get help right away if:  You are short of breath  and it gets worse.  You have more chest pain.  Your sickness gets worse. This is very serious if: ? You are an older adult. ? Your body's defense system is weak.  You cough up blood. Summary  Pneumonia is an infection of the lungs.  Most adults can be treated at home. Some will need treatment in a hospital.  Drink enough water to keep your pee pale yellow.  Get at least 8 hours of sleep each night. This information is not intended to replace advice given to you by your health care provider. Make sure you discuss any questions you have with your health care provider. Document Released: 11/14/2007 Document Revised: 01/23/2018 Document Reviewed: 01/23/2018 Elsevier Interactive Patient Education  2019 [employer].

## 2018-10-11 ENCOUNTER — Inpatient Hospital Stay (HOSPITAL_COMMUNITY)
Admission: EM | Admit: 2018-10-11 | Discharge: 2018-11-10 | DRG: 208 | Disposition: E | Payer: Medicare Other | Source: Skilled Nursing Facility | Attending: Internal Medicine | Admitting: Internal Medicine

## 2018-10-11 DIAGNOSIS — I482 Chronic atrial fibrillation, unspecified: Secondary | ICD-10-CM | POA: Diagnosis present

## 2018-10-11 DIAGNOSIS — J309 Allergic rhinitis, unspecified: Secondary | ICD-10-CM | POA: Diagnosis present

## 2018-10-11 DIAGNOSIS — I2510 Atherosclerotic heart disease of native coronary artery without angina pectoris: Secondary | ICD-10-CM | POA: Diagnosis present

## 2018-10-11 DIAGNOSIS — Z515 Encounter for palliative care: Secondary | ICD-10-CM | POA: Diagnosis not present

## 2018-10-11 DIAGNOSIS — Z0189 Encounter for other specified special examinations: Secondary | ICD-10-CM

## 2018-10-11 DIAGNOSIS — E1100 Type 2 diabetes mellitus with hyperosmolarity without nonketotic hyperglycemic-hyperosmolar coma (NKHHC): Secondary | ICD-10-CM | POA: Diagnosis present

## 2018-10-11 DIAGNOSIS — M170 Bilateral primary osteoarthritis of knee: Secondary | ICD-10-CM | POA: Diagnosis present

## 2018-10-11 DIAGNOSIS — U071 COVID-19: Secondary | ICD-10-CM | POA: Diagnosis not present

## 2018-10-11 DIAGNOSIS — D72810 Lymphocytopenia: Secondary | ICD-10-CM | POA: Diagnosis present

## 2018-10-11 DIAGNOSIS — E876 Hypokalemia: Secondary | ICD-10-CM | POA: Diagnosis not present

## 2018-10-11 DIAGNOSIS — J9601 Acute respiratory failure with hypoxia: Secondary | ICD-10-CM | POA: Diagnosis not present

## 2018-10-11 DIAGNOSIS — Z66 Do not resuscitate: Secondary | ICD-10-CM | POA: Diagnosis not present

## 2018-10-11 DIAGNOSIS — I10 Essential (primary) hypertension: Secondary | ICD-10-CM | POA: Diagnosis present

## 2018-10-11 DIAGNOSIS — Z91041 Radiographic dye allergy status: Secondary | ICD-10-CM

## 2018-10-11 DIAGNOSIS — Z880 Allergy status to penicillin: Secondary | ICD-10-CM

## 2018-10-11 DIAGNOSIS — I5033 Acute on chronic diastolic (congestive) heart failure: Secondary | ICD-10-CM

## 2018-10-11 DIAGNOSIS — Z801 Family history of malignant neoplasm of trachea, bronchus and lung: Secondary | ICD-10-CM

## 2018-10-11 DIAGNOSIS — Z978 Presence of other specified devices: Secondary | ICD-10-CM

## 2018-10-11 DIAGNOSIS — E662 Morbid (severe) obesity with alveolar hypoventilation: Secondary | ICD-10-CM | POA: Diagnosis present

## 2018-10-11 DIAGNOSIS — D539 Nutritional anemia, unspecified: Secondary | ICD-10-CM

## 2018-10-11 DIAGNOSIS — Z79899 Other long term (current) drug therapy: Secondary | ICD-10-CM

## 2018-10-11 DIAGNOSIS — I4891 Unspecified atrial fibrillation: Secondary | ICD-10-CM | POA: Diagnosis present

## 2018-10-11 DIAGNOSIS — J1282 Pneumonia due to coronavirus disease 2019: Secondary | ICD-10-CM

## 2018-10-11 DIAGNOSIS — Z6841 Body Mass Index (BMI) 40.0 and over, adult: Secondary | ICD-10-CM

## 2018-10-11 DIAGNOSIS — J9621 Acute and chronic respiratory failure with hypoxia: Secondary | ICD-10-CM

## 2018-10-11 DIAGNOSIS — Z794 Long term (current) use of insulin: Secondary | ICD-10-CM

## 2018-10-11 DIAGNOSIS — E870 Hyperosmolality and hypernatremia: Secondary | ICD-10-CM | POA: Diagnosis present

## 2018-10-11 DIAGNOSIS — J1289 Other viral pneumonia: Secondary | ICD-10-CM | POA: Diagnosis present

## 2018-10-11 DIAGNOSIS — K219 Gastro-esophageal reflux disease without esophagitis: Secondary | ICD-10-CM | POA: Diagnosis present

## 2018-10-11 DIAGNOSIS — Z4659 Encounter for fitting and adjustment of other gastrointestinal appliance and device: Secondary | ICD-10-CM

## 2018-10-11 DIAGNOSIS — G9341 Metabolic encephalopathy: Secondary | ICD-10-CM | POA: Diagnosis present

## 2018-10-11 DIAGNOSIS — J449 Chronic obstructive pulmonary disease, unspecified: Secondary | ICD-10-CM

## 2018-10-11 DIAGNOSIS — R0902 Hypoxemia: Secondary | ICD-10-CM

## 2018-10-11 DIAGNOSIS — Z7901 Long term (current) use of anticoagulants: Secondary | ICD-10-CM

## 2018-10-11 DIAGNOSIS — Z9981 Dependence on supplemental oxygen: Secondary | ICD-10-CM

## 2018-10-11 DIAGNOSIS — I110 Hypertensive heart disease with heart failure: Secondary | ICD-10-CM | POA: Diagnosis present

## 2018-10-11 DIAGNOSIS — R031 Nonspecific low blood-pressure reading: Secondary | ICD-10-CM | POA: Diagnosis present

## 2018-10-11 DIAGNOSIS — Z808 Family history of malignant neoplasm of other organs or systems: Secondary | ICD-10-CM

## 2018-10-11 DIAGNOSIS — Z7982 Long term (current) use of aspirin: Secondary | ICD-10-CM

## 2018-10-11 DIAGNOSIS — Z87891 Personal history of nicotine dependence: Secondary | ICD-10-CM

## 2018-10-11 DIAGNOSIS — E872 Acidosis: Secondary | ICD-10-CM | POA: Diagnosis present

## 2018-10-11 DIAGNOSIS — Z789 Other specified health status: Secondary | ICD-10-CM

## 2018-10-11 DIAGNOSIS — J9622 Acute and chronic respiratory failure with hypercapnia: Secondary | ICD-10-CM | POA: Diagnosis present

## 2018-10-11 DIAGNOSIS — M109 Gout, unspecified: Secondary | ICD-10-CM | POA: Diagnosis present

## 2018-10-11 LAB — CULTURE, BLOOD (ROUTINE X 2)

## 2018-10-11 NOTE — ED Provider Notes (Signed)
[facility] EMERGENCY DEPARTMENT Provider Note   CSN: [id number] Arrival date & time: 10/13/2018  2341    History   Chief Complaint Chief Complaint  Patient presents with   Shortness of Breath   Altered Mental Status    HPI Emily Fry is a 63 y.o. female.   The history is provided by the EMS personnel. The history is limited by the condition of the patient (Altered mental status).  She has history of COPD, diastolic heart failure, hypertension, atrial fibrillation, obstructive sleep apnea and was brought in by ambulance from skilled nursing facility because of dyspnea.  She normally is on oxygen 2 L/min, but apparently got dyspneic this afternoon.  EMS noted low oxygen saturation and increased her oxygen to 8 L/min via mask.  She is reported to have recently had a negative COVID test, but there are several COVID positive patients at the nursing facility where she is living.  Past Medical History:  Diagnosis Date   A-fib (HCC)    Allergic rhinitis    Bilateral primary osteoarthritis of knee    Chronic respiratory failure (HCC)    on 2L home o2   COPD (chronic obstructive pulmonary disease) (HCC)    Diastolic heart failure (HCC)    GERD (gastroesophageal reflux disease)    Gout    Hypertension    Obesity    Sleep apnea     Patient Active Problem List   Diagnosis Date Noted   HCAP (healthcare-associated pneumonia)    Altered mental state 10/07/2018   A-fib (HCC) 10/07/2018   COPD (chronic obstructive pulmonary disease) (HCC) 10/07/2018   Gout 10/07/2018   Benign essential HTN 10/07/2018   OSA (obstructive sleep apnea) 10/07/2018   Diastolic heart failure (HCC) 10/07/2018   GERD (gastroesophageal reflux disease) 10/07/2018   Hypoxia 04/14/2018   Hyponatremia 08/28/2015   Hypokalemia 08/28/2015   Type 2 diabetes mellitus with hyperosmolar nonketotic hyperglycemia (HCC) 08/25/2015   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      Home Medications    Prior to Admission medications   Medication Sig Start Date End Date Taking? Authorizing Provider  acetaminophen (TYLENOL) 325 MG tablet Take 650 mg by mouth every 4 (four) hours as needed for fever.    [provider]  acetaZOLAMIDE (DIAMOX) 250 MG tablet Take 250 mg by mouth 2 (two) times daily. 02/07/18   [provider]  albuterol (VENTOLIN HFA) 108 (90 Base) MCG/ACT inhaler Inhale 1-2 puffs into the lungs every 3 (three) hours as needed for wheezing or shortness of breath. 10/09/18   Meccariello, Solmon Ice, DO  aspirin EC 81 MG tablet Take 81 mg by mouth daily.    [provider]  CARTIA XT 240 MG 24 hr capsule Take 240 mg by mouth at bedtime. 07/27/15   [provider]  cefdinir (OMNICEF) 300 MG capsule Take 1 capsule (300 mg total) by mouth every 12 (twelve) hours for 11 doses. 10/09/18 10/15/18  Meccariello, Solmon Ice, DO  Cholecalciferol 25 MCG (1000 UT) tablet Take 2,000 Units by mouth daily.    [provider]  Colchicine 0.6 MG CAPS Take 0.6 mg by mouth daily as needed (gout).  04/04/18   [provider]  diclofenac sodium (VOLTAREN) 1 % GEL Apply 2 g topically every 12 (twelve) hours as needed (for gout apply bilateral knees  for pain).    [provider]  doxycycline (VIBRA-TABS) 100 MG tablet Take 1 tablet (100 mg total) by mouth every 12 (twelve) hours for 11 doses. 10/09/18 10/15/18  Meccariello, Solmon Ice, DO  furosemide (LASIX) 80 MG tablet Take 80 mg by mouth daily. afternoon    [provider]  Insulin Glargine (LANTUS) 100 UNIT/ML Solostar Pen Inject 10 Units into the skin at bedtime. 10/09/18   Meccariello, Solmon Ice, DO  isosorbide mononitrate (IMDUR) 30 MG 24 hr tablet Take 30 mg by mouth daily.    [provider]  metoprolol tartrate (LOPRESSOR) 25 MG  tablet Take 1 tablet (25 mg total) by mouth 2 (two) times daily. 10/09/18   Meccariello, Solmon Ice, DO  mometasone-formoterol (DULERA) 200-5 MCG/ACT AERO Inhale 2 puffs into the lungs 2 (two) times daily. 10/09/18   Meccariello, Solmon Ice, DO  nystatin (MYCOSTATIN/NYSTOP) powder Apply topically every 12 (twelve) hours as needed (yeast). Under breast    [provider]  pantoprazole (PROTONIX) 40 MG tablet Take 40 mg by mouth daily. 08/18/15   [provider]  senna (SENOKOT) 8.6 MG tablet Take 2 tablets by mouth daily as needed for constipation.    [provider]  TRULICITY 1.5 MG/0.5ML SOPN Inject 1.5 mg into the skin once a week. On saturday 03/23/18   [provider]  umeclidinium bromide (INCRUSE ELLIPTA) 62.5 MCG/INH AEPB Inhale 1 puff into the lungs daily. 10/10/18   Meccariello, Solmon Ice, DO  warfarin (COUMADIN) 5 MG tablet Take 5 mg by mouth See admin instructions. Take 1 tablet by mouth at bedtime every Tuesday, Thursday, Saturday and Sunday 10/05/18   [provider]  warfarin (COUMADIN) 6 MG tablet Take 6 mg by mouth See admin instructions. Take 1 tablet by mouth at bedtime every Monday, Wednesday, Friday for Afib for 2 weeks. 10/06/18   [provider]    Family History Family History  Problem Relation Age of Onset   Cancer Mother        deceased   Throat cancer Mother    Cancer Father        deceased   Lung cancer Father     Social History Social History   Tobacco Use   Smoking status: Former Smoker   Smokeless tobacco: Never Used   Tobacco comment: stopped 9 years ago  Substance Use Topics   Alcohol use: No    Alcohol/week: 0.0 standard drinks   Drug use: No     Allergies   Contrast media [iodinated diagnostic agents]; Tomato; and Amoxicillin   Review of Systems Review of Systems  Unable to perform ROS: Mental status change     Physical Exam Updated Vital Signs BP 127/74    Pulse 76    Temp 98 F (36.7  C) (Oral)    Resp 14    Ht  (1.626 m)    Wt (!) 150.9 kg    SpO2 100% Comment: Simultaneous filing. User may not have seen previous data.   BMI 57.10 kg/m   Physical Exam Vitals signs and nursing note reviewed.    Morbidly obese 63 year old female, resting comfortably and in no acute distress. Vital signs are normal. Oxygen saturation is 99%, which is normal. Head is normocephalic and atraumatic. PERRLA, EOMI. Oropharynx is clear. Neck is nontender and supple without adenopathy or JVD. Back is nontender and there is no CVA tenderness. Lungs have poor airflow with faint expiratory rhonchi diffusely.  No definite rales or  wheezes.  There is some use of accessory muscles of respiration. Chest is nontender. Heart has regular rate and rhythm without murmur. Abdomen is soft, flat, nontender without masses or hepatosplenomegaly and peristalsis is normoactive. Extremities have 1+ edema, full range of motion is present. Skin is warm and dry without rash. Neurologic: Somnolent but will answer simple questions, cranial nerves are intact, there are no motor or sensory deficits.  ED Treatments / Results  Labs (all labs ordered are listed, but only abnormal results are displayed) Labs Reviewed  SARS CORONAVIRUS 2 (HOSPITAL ORDER, PERFORMED IN [facility]) - Abnormal; Notable for the following components:      Result Value   SARS Coronavirus 2 POSITIVE (*)    All other components within normal limits  CBC WITH DIFFERENTIAL/PLATELET - Abnormal; Notable for the following components:   Hemoglobin 11.5 (*)    MCV 102.5 (*)    MCHC 27.6 (*)    nRBC 0.7 (*)    Lymphs Abs 0.6 (*)    Abs Immature Granulocytes 0.08 (*)    All other components within normal limits  COMPREHENSIVE METABOLIC PANEL - Abnormal; Notable for the following components:   CO2 34 (*)    Glucose, Bld 129 (*)    BUN 25 (*)    Creatinine, Ser 1.27 (*)    Albumin 3.1 (*)    GFR calc non Af Amer 46 (*)    GFR  calc Af Amer 53 (*)    All other components within normal limits  LACTATE DEHYDROGENASE - Abnormal; Notable for the following components:   LDH 284 (*)    All other components within normal limits  FIBRINOGEN - Abnormal; Notable for the following components:   Fibrinogen 653 (*)    All other components within normal limits  BRAIN NATRIURETIC PEPTIDE - Abnormal; Notable for the following components:   B Natriuretic Peptide 201.2 (*)    All other components within normal limits  BLOOD GAS, ARTERIAL - Abnormal; Notable for the following components:   pH, Arterial 7.053 (*)    pO2, Arterial 113 (*)    Bicarbonate 39.0 (*)    Acid-Base Excess 8.5 (*)    All other components within normal limits  CULTURE, BLOOD (ROUTINE X 2)  CULTURE, BLOOD (ROUTINE X 2)  LACTIC ACID, PLASMA  D-DIMER, QUANTITATIVE (NOT AT [facility])  PROCALCITONIN  FERRITIN  TRIGLYCERIDES  C-REACTIVE PROTEIN  TROPONIN I  LACTIC ACID, PLASMA  I-STAT ARTERIAL BLOOD GAS, ED    EKG EKG Interpretation  Date/Time:  Sunday Oct 12 2018 00:25:15 EDT Ventricular Rate:  81 PR Interval:    QRS Duration: 109 QT Interval:  385 QTC Calculation: 447 R Axis:   10 Text Interpretation:  Sinus rhythm Prolonged PR interval Low voltage, precordial leads Abnormal lateral Q waves When compared with ECG of 10/09/2018, No significant change was found Confirmed by Dione BoozeGlick, Aloysuis Ribaudo ([id number]) on 10/12/2018 12:28:53 AM   Radiology Dg Abd 1 View  Result Date: 10/12/2018 CLINICAL DATA:  Enteric tube placement. EXAM: ABDOMEN - 1 VIEW COMPARISON:  CT abdomen pelvis dated May 08, 2014. FINDINGS: Enteric tube tip in the stomach. The visualized bowel gas pattern is normal. No radio-opaque calculi or other significant radiographic abnormality are seen. No acute osseous abnormality. IMPRESSION: 1. Enteric tube in the stomach. Electronically Signed   By: Obie DredgeWilliam T Derry M.D.   On: 10/12/2018 06:24   Dg Chest Port 1 View  Result Date: 10/12/2018 CLINICAL  DATA:  Follow-up examination status post endotracheal  tube repositioning. EXAM: PORTABLE CHEST 1 VIEW COMPARISON:  Prior radiograph earlier same day. FINDINGS: Tip of the endotracheal tube still remains deep within the right mainstem bronchus. Retraction by approximately 3.5 cm recommended. Remainder of the examination remains unchanged. IMPRESSION: Tip of endotracheal tube remains deep within the right mainstem bronchus. Retraction by approximately 3-3.5 cm recommended. Critical Value/emergent results were called by telephone at the time of interpretation on 10/12/2018 at 4:51 am to Dr. Dione Booze , who verbally acknowledged these results. Electronically Signed   By: Rise Mu M.D.   On: 10/12/2018 04:51   Dg Chest Port 1 View  Result Date: 10/12/2018 CLINICAL DATA:  Initial evaluation for intubation. EXAM: PORTABLE CHEST 1 VIEW COMPARISON:  Prior radiograph from earlier the same day. FINDINGS: Endotracheal tube in place, position deep with tip in the right mainstem bronchus. At time of this dictation, a second film has been obtained. Tip still appears to be within the right mainstem bronchus. Enteric tube courses into the abdomen. Stable cardiomegaly. Low lung volumes. Pulmonary edema with bilateral pleural effusions again seen, similar to previous. Probable superimposed bibasilar atelectasis. No pneumothorax. Osseous structures unchanged. IMPRESSION: 1. Tip of endotracheal tube positioned within the right mainstem bronchus. At time of this dictation, a second filled with a repositioned tube has already been acquired. 2. Diffuse pulmonary edema and small bilateral pleural effusions, suggesting CHF. 3. Probable superimposed bibasilar atelectasis. 4. Electronically Signed   By: Rise Mu M.D.   On: 10/12/2018 04:47   Dg Chest Port 1 View  Result Date: 10/12/2018 CLINICAL DATA:  Altered mental status, shortness of breath EXAM: PORTABLE CHEST 1 VIEW COMPARISON:  10/07/2018 FINDINGS:  Cardiomegaly. Diffuse bilateral airspace disease, favor edema/CHF. Suspect layering effusions. Very low lung volumes. No acute bony abnormality. IMPRESSION: Cardiomegaly with severe diffuse bilateral airspace disease and suspected layering effusions, likely edema/CHF. Low lung volumes. Electronically Signed   By: Charlett Nose M.D.   On: 10/12/2018 01:01    Procedures Procedure Name: Intubation Date/Time: 10/12/2018 3:14 AM Performed by: Dione Booze, MD Pre-anesthesia Checklist: Patient identified, Patient being monitored, Emergency Drugs available, Timeout performed and Suction available Oxygen Delivery Method: Non-rebreather mask Preoxygenation: Pre-oxygenation with 100% oxygen Induction Type: Rapid sequence Ventilation: Unable to mask ventilate Laryngoscope Size: Glidescope and 4 Grade View: Grade I Tube size: 7.5 mm Number of attempts: 1 Airway Equipment and Method: Video-laryngoscopy and Rigid stylet Placement Confirmation: ETT inserted through vocal cords under direct vision,  CO2 detector and Breath sounds checked- equal and bilateral Secured at: 25 cm Tube secured with: ETT holder Dental Injury: Teeth and Oropharynx as per pre-operative assessment        CRITICAL CARE Performed by: Dione Booze Total critical care time: 190 minutes Critical care time was exclusive of separately billable procedures and treating other patients. Critical care was necessary to treat or prevent imminent or life-threatening deterioration. Critical care was time spent personally by me on the following activities: development of treatment plan with patient and/or surrogate as well as nursing, discussions with consultants, evaluation of patient's response to treatment, examination of patient, obtaining history from patient or surrogate, ordering and performing treatments and interventions, ordering and review of laboratory studies, ordering and review of radiographic studies, pulse oximetry and  re-evaluation of patient's condition.  Medications Ordered in ED Medications  furosemide (LASIX) injection 80 mg (40 mg Intravenous Given 10/12/18 0229)     Initial Impression / Assessment and Plan / ED Course  I have reviewed the triage vital signs and  the nursing notes.  Pertinent labs & imaging results that were available during my care of the patient were reviewed by me and considered in my medical decision making (see chart for details).  Worsening respiratory status and patient with known CHF and COPD.  Old records are reviewed, and she was discharged from the hospital 2 days ago after being admitted for hypercarbia with respiratory failure and pneumonia.  Will need to repeat ABG to see if she is retaining carbon dioxide again.  Will check chest x-ray to see if she has any new infiltrates, will check BNP to look for evidence of worsening heart failure.  She is likely going to need to be admitted again, so COVID-19 test is sent.  She is currently full CODE STATUS.  Chest x-ray appears to show pulmonary edema and I feel this is likely the source of her respiratory difficulty.  ABG shows respiratory acidosis with PCO2 too high to be detected, pH 7.05.  She was started on BiPAP.  ECG was unremarkable.  Labs show slight bump in creatinine to 1.27 from 0.99.  BNP is elevated to 201.2.  LDH is elevated but lactic acid and CRP are normal.  Procalcitonin is 0.16.  WBC is 7.1, but mild leukopenia is noted.  Mild anemia is present which is actually improved over baseline.  COVID-19 screen was positive.  Patient was taken off of BiPAP and intubated by rapid sequence induction.  Post intubation x-ray is pending.  Case is discussed with Dr. Darrick Penna of pulmonary critical care service who agrees to admit the patient.  At this point, no indication of bacterial infection, so antibiotics have not been initiated.  After orogastric tube was placed, patient started having some bleeding from her mouth.  I went to  reevaluate the patient, and tooth #10 appears to have been knocked out.  There was no dental trauma at time of intubation.  Bleeding is controlled with direct pressure.  Portable chest x-ray shows ET tube near the carina, and it is withdrawn 2 cm.  Repeat chest x-ray still shows ET tube within the right mainstem bronchus tube is pulled out additional 3 cm.  I have spoken with the patient's sister to advise her of the events of tonight, and stressed to her that with a COVID virus infection and the patient's multiple comorbidities, she is at markedly increased risk for poor outcome.  Final Clinical Impressions(s) / ED Diagnoses   Final diagnoses:  Acute on chronic respiratory failure with hypoxia and hypercapnia (HCC)  Acute on chronic diastolic heart failure (HCC)  Real time reverse transcriptase PCR positive for COVID-19 virus  Macrocytic anemia  Chronic obstructive pulmonary disease, unspecified COPD type (HCC)  Morbid obesity [facility])    ED Discharge Orders    None       Dione Booze, MD 10/12/18 [phone]

## 2018-10-12 ENCOUNTER — Inpatient Hospital Stay (HOSPITAL_COMMUNITY): Payer: Medicare Other

## 2018-10-12 ENCOUNTER — Encounter (HOSPITAL_COMMUNITY): Payer: Self-pay | Admitting: Emergency Medicine

## 2018-10-12 ENCOUNTER — Emergency Department (HOSPITAL_COMMUNITY): Payer: Medicare Other

## 2018-10-12 DIAGNOSIS — J449 Chronic obstructive pulmonary disease, unspecified: Secondary | ICD-10-CM | POA: Diagnosis present

## 2018-10-12 DIAGNOSIS — J1289 Other viral pneumonia: Secondary | ICD-10-CM | POA: Diagnosis present

## 2018-10-12 DIAGNOSIS — R031 Nonspecific low blood-pressure reading: Secondary | ICD-10-CM | POA: Diagnosis present

## 2018-10-12 DIAGNOSIS — E876 Hypokalemia: Secondary | ICD-10-CM | POA: Diagnosis not present

## 2018-10-12 DIAGNOSIS — Z7901 Long term (current) use of anticoagulants: Secondary | ICD-10-CM | POA: Diagnosis not present

## 2018-10-12 DIAGNOSIS — I10 Essential (primary) hypertension: Secondary | ICD-10-CM | POA: Diagnosis not present

## 2018-10-12 DIAGNOSIS — E662 Morbid (severe) obesity with alveolar hypoventilation: Secondary | ICD-10-CM | POA: Diagnosis present

## 2018-10-12 DIAGNOSIS — I5031 Acute diastolic (congestive) heart failure: Secondary | ICD-10-CM

## 2018-10-12 DIAGNOSIS — D539 Nutritional anemia, unspecified: Secondary | ICD-10-CM | POA: Diagnosis present

## 2018-10-12 DIAGNOSIS — I5033 Acute on chronic diastolic (congestive) heart failure: Secondary | ICD-10-CM | POA: Diagnosis present

## 2018-10-12 DIAGNOSIS — J9621 Acute and chronic respiratory failure with hypoxia: Secondary | ICD-10-CM | POA: Diagnosis present

## 2018-10-12 DIAGNOSIS — J309 Allergic rhinitis, unspecified: Secondary | ICD-10-CM | POA: Diagnosis present

## 2018-10-12 DIAGNOSIS — J9601 Acute respiratory failure with hypoxia: Secondary | ICD-10-CM | POA: Diagnosis present

## 2018-10-12 DIAGNOSIS — I2510 Atherosclerotic heart disease of native coronary artery without angina pectoris: Secondary | ICD-10-CM | POA: Diagnosis present

## 2018-10-12 DIAGNOSIS — E872 Acidosis: Secondary | ICD-10-CM | POA: Diagnosis present

## 2018-10-12 DIAGNOSIS — I110 Hypertensive heart disease with heart failure: Secondary | ICD-10-CM | POA: Diagnosis present

## 2018-10-12 DIAGNOSIS — I501 Left ventricular failure: Secondary | ICD-10-CM

## 2018-10-12 DIAGNOSIS — Z978 Presence of other specified devices: Secondary | ICD-10-CM | POA: Diagnosis not present

## 2018-10-12 DIAGNOSIS — Z66 Do not resuscitate: Secondary | ICD-10-CM | POA: Diagnosis not present

## 2018-10-12 DIAGNOSIS — Z515 Encounter for palliative care: Secondary | ICD-10-CM | POA: Diagnosis not present

## 2018-10-12 DIAGNOSIS — I4891 Unspecified atrial fibrillation: Secondary | ICD-10-CM | POA: Diagnosis not present

## 2018-10-12 DIAGNOSIS — E870 Hyperosmolality and hypernatremia: Secondary | ICD-10-CM | POA: Diagnosis present

## 2018-10-12 DIAGNOSIS — M170 Bilateral primary osteoarthritis of knee: Secondary | ICD-10-CM | POA: Diagnosis present

## 2018-10-12 DIAGNOSIS — U071 COVID-19: Secondary | ICD-10-CM | POA: Diagnosis present

## 2018-10-12 DIAGNOSIS — I482 Chronic atrial fibrillation, unspecified: Secondary | ICD-10-CM | POA: Diagnosis present

## 2018-10-12 DIAGNOSIS — J9622 Acute and chronic respiratory failure with hypercapnia: Secondary | ICD-10-CM | POA: Diagnosis present

## 2018-10-12 DIAGNOSIS — D72810 Lymphocytopenia: Secondary | ICD-10-CM | POA: Diagnosis present

## 2018-10-12 DIAGNOSIS — E1100 Type 2 diabetes mellitus with hyperosmolarity without nonketotic hyperglycemic-hyperosmolar coma (NKHHC): Secondary | ICD-10-CM | POA: Diagnosis not present

## 2018-10-12 DIAGNOSIS — Z6841 Body Mass Index (BMI) 40.0 and over, adult: Secondary | ICD-10-CM | POA: Diagnosis not present

## 2018-10-12 DIAGNOSIS — G9341 Metabolic encephalopathy: Secondary | ICD-10-CM | POA: Diagnosis present

## 2018-10-12 LAB — BLOOD GAS, ARTERIAL
Acid-Base Excess: 8.5 mmol/L — ABNORMAL HIGH (ref 0.0–2.0)
Bicarbonate: 39 mmol/L — ABNORMAL HIGH (ref 20.0–28.0)
Drawn by: 41422
FIO2: 100
O2 Saturation: 96.8 %
Patient temperature: 98
pH, Arterial: 7.053 — CL (ref 7.350–7.450)
pO2, Arterial: 113 mmHg — ABNORMAL HIGH (ref 83.0–108.0)

## 2018-10-12 LAB — COMPREHENSIVE METABOLIC PANEL
ALT: 33 U/L (ref 0–44)
AST: 39 U/L (ref 15–41)
Albumin: 3.1 g/dL — ABNORMAL LOW (ref 3.5–5.0)
Alkaline Phosphatase: 117 U/L (ref 38–126)
Anion gap: 9 (ref 5–15)
BUN: 25 mg/dL — ABNORMAL HIGH (ref 6–20)
CO2: 34 mmol/L — ABNORMAL HIGH (ref 22–32)
Calcium: 9.1 mg/dL (ref 8.9–10.3)
Chloride: 98 mmol/L (ref 98–111)
Creatinine, Ser: 1.27 mg/dL — ABNORMAL HIGH (ref 0.44–1.00)
GFR calc Af Amer: 53 mL/min — ABNORMAL LOW (ref >60)
GFR calc non Af Amer: 46 mL/min — ABNORMAL LOW (ref >60)
Glucose, Bld: 129 mg/dL — ABNORMAL HIGH (ref 70–99)
Potassium: 4.6 mmol/L (ref 3.5–5.1)
Sodium: 141 mmol/L (ref 135–145)
Total Bilirubin: 0.6 mg/dL (ref 0.3–1.2)
Total Protein: 6.7 g/dL (ref 6.5–8.1)

## 2018-10-12 LAB — URINALYSIS, ROUTINE W REFLEX MICROSCOPIC
Bacteria, UA: NONE SEEN
Bilirubin Urine: NEGATIVE
Glucose, UA: NEGATIVE mg/dL
Ketones, ur: NEGATIVE mg/dL
Leukocytes,Ua: NEGATIVE
Nitrite: NEGATIVE
Protein, ur: NEGATIVE mg/dL
Specific Gravity, Urine: 1.009 (ref 1.005–1.030)
pH: 5 (ref 5.0–8.0)

## 2018-10-12 LAB — C-REACTIVE PROTEIN: CRP: <0.8 mg/dL (ref <1.0)

## 2018-10-12 LAB — MAGNESIUM
Magnesium: 2.7 mg/dL — ABNORMAL HIGH (ref 1.7–2.4)
Magnesium: 2.1 mg/dL (ref 1.7–2.4)

## 2018-10-12 LAB — POCT I-STAT 7, (LYTES, BLD GAS, ICA,H+H)
Acid-Base Excess: 8 mmol/L — ABNORMAL HIGH (ref 0.0–2.0)
Acid-Base Excess: 9 mmol/L — ABNORMAL HIGH (ref 0.0–2.0)
Acid-Base Excess: 11 mmol/L — ABNORMAL HIGH (ref 0.0–2.0)
Bicarbonate: 36.9 mmol/L — ABNORMAL HIGH (ref 20.0–28.0)
Bicarbonate: 34.9 mmol/L — ABNORMAL HIGH (ref 20.0–28.0)
Bicarbonate: 34.2 mmol/L — ABNORMAL HIGH (ref 20.0–28.0)
Calcium, Ion: 1.25 mmol/L (ref 1.15–1.40)
Calcium, Ion: 1.22 mmol/L (ref 1.15–1.40)
Calcium, Ion: 1.19 mmol/L (ref 1.15–1.40)
HCT: 36 % (ref 36.0–46.0)
HCT: 32 % — ABNORMAL LOW (ref 36.0–46.0)
HCT: 32 % — ABNORMAL LOW (ref 36.0–46.0)
Hemoglobin: 12.2 g/dL (ref 12.0–15.0)
Hemoglobin: 10.9 g/dL — ABNORMAL LOW (ref 12.0–15.0)
Hemoglobin: 10.9 g/dL — ABNORMAL LOW (ref 12.0–15.0)
O2 Saturation: 99 %
O2 Saturation: 93 %
O2 Saturation: 93 %
Patient temperature: 97.8
Patient temperature: 36.8
Patient temperature: 98.6
Potassium: 4.1 mmol/L (ref 3.5–5.1)
Potassium: 3.7 mmol/L (ref 3.5–5.1)
Potassium: 3.1 mmol/L — ABNORMAL LOW (ref 3.5–5.1)
Sodium: 139 mmol/L (ref 135–145)
Sodium: 139 mmol/L (ref 135–145)
Sodium: 140 mmol/L (ref 135–145)
TCO2: 39 mmol/L — ABNORMAL HIGH (ref 22–32)
TCO2: 36 mmol/L — ABNORMAL HIGH (ref 22–32)
TCO2: 35 mmol/L — ABNORMAL HIGH (ref 22–32)
pCO2 arterial: 69.5 mmHg (ref 32.0–48.0)
pCO2 arterial: 50 mmHg — ABNORMAL HIGH (ref 32.0–48.0)
pCO2 arterial: 39.7 mmHg (ref 32.0–48.0)
pH, Arterial: 7.331 — ABNORMAL LOW (ref 7.350–7.450)
pH, Arterial: 7.451 — ABNORMAL HIGH (ref 7.350–7.450)
pH, Arterial: 7.543 — ABNORMAL HIGH (ref 7.350–7.450)
pO2, Arterial: 170 mmHg — ABNORMAL HIGH (ref 83.0–108.0)
pO2, Arterial: 63 mmHg — ABNORMAL LOW (ref 83.0–108.0)
pO2, Arterial: 58 mmHg — ABNORMAL LOW (ref 83.0–108.0)

## 2018-10-12 LAB — CBC WITH DIFFERENTIAL/PLATELET
Abs Immature Granulocytes: 0.08 10*3/uL — ABNORMAL HIGH (ref 0.00–0.07)
Basophils Absolute: 0 10*3/uL (ref 0.0–0.1)
Basophils Relative: 0 %
Eosinophils Absolute: 0.1 10*3/uL (ref 0.0–0.5)
Eosinophils Relative: 1 %
HCT: 41.7 % (ref 36.0–46.0)
Hemoglobin: 11.5 g/dL — ABNORMAL LOW (ref 12.0–15.0)
Immature Granulocytes: 1 %
Lymphocytes Relative: 9 %
Lymphs Abs: 0.6 10*3/uL — ABNORMAL LOW (ref 0.7–4.0)
MCH: 28.3 pg (ref 26.0–34.0)
MCHC: 27.6 g/dL — ABNORMAL LOW (ref 30.0–36.0)
MCV: 102.5 fL — ABNORMAL HIGH (ref 80.0–100.0)
Monocytes Absolute: 0.6 10*3/uL (ref 0.1–1.0)
Monocytes Relative: 9 %
Neutro Abs: 5.7 10*3/uL (ref 1.7–7.7)
Neutrophils Relative %: 80 %
Platelets: 171 10*3/uL (ref 150–400)
RBC: 4.07 MIL/uL (ref 3.87–5.11)
RDW: 14.6 % (ref 11.5–15.5)
WBC: 7.1 10*3/uL (ref 4.0–10.5)
nRBC: 0.7 % — ABNORMAL HIGH (ref 0.0–0.2)

## 2018-10-12 LAB — TROPONIN I: Troponin I: <0.03 ng/mL (ref <0.03)

## 2018-10-12 LAB — CBC
HCT: 40.9 % (ref 36.0–46.0)
Hemoglobin: 11.4 g/dL — ABNORMAL LOW (ref 12.0–15.0)
MCH: 28.2 pg (ref 26.0–34.0)
MCHC: 27.9 g/dL — ABNORMAL LOW (ref 30.0–36.0)
MCV: 101.2 fL — ABNORMAL HIGH (ref 80.0–100.0)
Platelets: 164 10*3/uL (ref 150–400)
RBC: 4.04 MIL/uL (ref 3.87–5.11)
RDW: 14.6 % (ref 11.5–15.5)
WBC: 5.5 10*3/uL (ref 4.0–10.5)
nRBC: 0.9 % — ABNORMAL HIGH (ref 0.0–0.2)

## 2018-10-12 LAB — SARS CORONAVIRUS 2 BY RT PCR (HOSPITAL ORDER, PERFORMED IN ~~LOC~~ HOSPITAL LAB): SARS Coronavirus 2: POSITIVE — AB

## 2018-10-12 LAB — GLUCOSE, CAPILLARY
Glucose-Capillary: 129 mg/dL — ABNORMAL HIGH (ref 70–99)
Glucose-Capillary: 131 mg/dL — ABNORMAL HIGH (ref 70–99)
Glucose-Capillary: 119 mg/dL — ABNORMAL HIGH (ref 70–99)
Glucose-Capillary: 107 mg/dL — ABNORMAL HIGH (ref 70–99)
Glucose-Capillary: 115 mg/dL — ABNORMAL HIGH (ref 70–99)

## 2018-10-12 LAB — PROTIME-INR
INR: 2.2 — ABNORMAL HIGH (ref 0.8–1.2)
Prothrombin Time: 23.7 seconds — ABNORMAL HIGH (ref 11.4–15.2)

## 2018-10-12 LAB — LACTIC ACID, PLASMA
Lactic Acid, Venous: 0.5 mmol/L (ref 0.5–1.9)
Lactic Acid, Venous: 0.9 mmol/L (ref 0.5–1.9)

## 2018-10-12 LAB — BRAIN NATRIURETIC PEPTIDE: B Natriuretic Peptide: 201.2 pg/mL — ABNORMAL HIGH (ref 0.0–100.0)

## 2018-10-12 LAB — ABO/RH: ABO/RH(D): A NEG

## 2018-10-12 LAB — LACTATE DEHYDROGENASE: LDH: 284 U/L — ABNORMAL HIGH (ref 98–192)

## 2018-10-12 LAB — FERRITIN: Ferritin: 65 ng/mL (ref 11–307)

## 2018-10-12 LAB — D-DIMER, QUANTITATIVE: D-Dimer, Quant: 0.48 ug/mL-FEU (ref 0.00–0.50)

## 2018-10-12 LAB — PROCALCITONIN: Procalcitonin: 0.16 ng/mL

## 2018-10-12 LAB — FIBRINOGEN: Fibrinogen: 653 mg/dL — ABNORMAL HIGH (ref 210–475)

## 2018-10-12 LAB — PHOSPHORUS
Phosphorus: 1.8 mg/dL — ABNORMAL LOW (ref 2.5–4.6)
Phosphorus: 1.2 mg/dL — ABNORMAL LOW (ref 2.5–4.6)

## 2018-10-12 LAB — TRIGLYCERIDES: Triglycerides: 86 mg/dL (ref <150)

## 2018-10-12 MED ORDER — WARFARIN SODIUM 5 MG PO TABS
5.0000 mg | ORAL_TABLET | Freq: Once | ORAL | Status: AC
  Administered 2018-10-12: 17:00:00 5 mg via ORAL
  Filled 2018-10-12: qty 1

## 2018-10-12 MED ORDER — MIDAZOLAM HCL 2 MG/2ML IJ SOLN
2.0000 mg | INTRAMUSCULAR | Status: DC | PRN
  Filled 2018-10-12 (×2): qty 2

## 2018-10-12 MED ORDER — ASPIRIN EC 81 MG PO TBEC
81.0000 mg | DELAYED_RELEASE_TABLET | Freq: Every day | ORAL | Status: DC
  Filled 2018-10-12: qty 1

## 2018-10-12 MED ORDER — INSULIN ASPART 100 UNIT/ML ~~LOC~~ SOLN
0.0000 [IU] | SUBCUTANEOUS | Status: DC
  Administered 2018-10-13 – 2018-10-15 (×8): 1 [IU] via SUBCUTANEOUS
  Administered 2018-10-16 (×3): 2 [IU] via SUBCUTANEOUS

## 2018-10-12 MED ORDER — PROPOFOL 1000 MG/100ML IV EMUL
5.0000 ug/kg/min | INTRAVENOUS | Status: DC
  Administered 2018-10-12: 5 ug/kg/min via INTRAVENOUS

## 2018-10-12 MED ORDER — VITAL HIGH PROTEIN PO LIQD
1000.0000 mL | ORAL | Status: DC
  Administered 2018-10-12 – 2018-10-15 (×4): 1000 mL

## 2018-10-12 MED ORDER — BISACODYL 10 MG RE SUPP: 10.0000 mg | Freq: Every day | RECTAL | Status: DC | PRN

## 2018-10-12 MED ORDER — FENTANYL CITRATE (PF) 100 MCG/2ML IJ SOLN
50.0000 ug | Freq: Once | INTRAMUSCULAR | Status: AC
  Administered 2018-10-12: 50 ug via INTRAVENOUS

## 2018-10-12 MED ORDER — DOCUSATE SODIUM 50 MG/5ML PO LIQD: 100.0000 mg | Freq: Two times a day (BID) | ORAL | Status: DC | PRN

## 2018-10-12 MED ORDER — FENTANYL 2500MCG IN NS 250ML (10MCG/ML) PREMIX INFUSION
50.0000 ug/h | INTRAVENOUS | Status: DC
  Administered 2018-10-12: 18:00:00 150 ug/h via INTRAVENOUS
  Administered 2018-10-12: 04:00:00 100 ug/h via INTRAVENOUS
  Administered 2018-10-13: 11:00:00 150 ug/h via INTRAVENOUS
  Administered 2018-10-14 – 2018-10-16 (×2): 50 ug/h via INTRAVENOUS
  Filled 2018-10-12 (×6): qty 250

## 2018-10-12 MED ORDER — FENTANYL BOLUS VIA INFUSION
50.0000 ug | INTRAVENOUS | Status: DC | PRN
  Administered 2018-10-14: 16:00:00 50 ug via INTRAVENOUS
  Filled 2018-10-12: qty 50

## 2018-10-12 MED ORDER — PROPOFOL 1000 MG/100ML IV EMUL
INTRAVENOUS | Status: AC
  Filled 2018-10-12: qty 100

## 2018-10-12 MED ORDER — MIDAZOLAM HCL 2 MG/2ML IJ SOLN
2.0000 mg | INTRAMUSCULAR | Status: DC | PRN
  Administered 2018-10-12 – 2018-10-13 (×7): 2 mg via INTRAVENOUS
  Filled 2018-10-12 (×5): qty 2

## 2018-10-12 MED ORDER — FUROSEMIDE 10 MG/ML IJ SOLN
60.0000 mg | Freq: Two times a day (BID) | INTRAMUSCULAR | Status: DC
  Administered 2018-10-12 – 2018-10-13 (×3): 60 mg via INTRAVENOUS
  Filled 2018-10-12 (×3): qty 6

## 2018-10-12 MED ORDER — INSULIN ASPART 100 UNIT/ML ~~LOC~~ SOLN
0.0000 [IU] | SUBCUTANEOUS | Status: DC
  Administered 2018-10-12 (×2): 2 [IU] via SUBCUTANEOUS

## 2018-10-12 MED ORDER — PRO-STAT SUGAR FREE PO LIQD
30.0000 mL | Freq: Two times a day (BID) | ORAL | Status: DC
  Administered 2018-10-12: 30 mL
  Filled 2018-10-12: qty 30

## 2018-10-12 MED ORDER — FUROSEMIDE 10 MG/ML IJ SOLN
80.0000 mg | Freq: Once | INTRAMUSCULAR | Status: AC
  Administered 2018-10-12: 02:00:00 40 mg via INTRAVENOUS
  Filled 2018-10-12: qty 8

## 2018-10-12 MED ORDER — FENTANYL CITRATE (PF) 100 MCG/2ML IJ SOLN
INTRAMUSCULAR | Status: AC
  Filled 2018-10-12: qty 2

## 2018-10-12 MED ORDER — PANTOPRAZOLE SODIUM 40 MG IV SOLR
40.0000 mg | Freq: Every day | INTRAVENOUS | Status: DC
  Administered 2018-10-12 – 2018-10-16 (×5): 40 mg via INTRAVENOUS
  Filled 2018-10-12 (×5): qty 40

## 2018-10-12 MED ORDER — VITAL HIGH PROTEIN PO LIQD: 1000.0000 mL | ORAL | Status: DC

## 2018-10-12 MED ORDER — FUROSEMIDE 10 MG/ML IJ SOLN
60.0000 mg | Freq: Once | INTRAMUSCULAR | Status: AC
  Administered 2018-10-12: 13:00:00 60 mg via INTRAVENOUS
  Filled 2018-10-12: qty 6

## 2018-10-12 MED ORDER — WARFARIN - PHARMACIST DOSING INPATIENT
Freq: Every day | Status: DC
  Administered 2018-10-12: 18:00:00

## 2018-10-12 MED ORDER — MIDAZOLAM HCL 2 MG/2ML IJ SOLN
INTRAMUSCULAR | Status: AC
  Filled 2018-10-12: qty 6

## 2018-10-12 MED ORDER — IPRATROPIUM-ALBUTEROL 20-100 MCG/ACT IN AERS
2.0000 | INHALATION_SPRAY | Freq: Four times a day (QID) | RESPIRATORY_TRACT | Status: DC | PRN
  Filled 2018-10-12: qty 4

## 2018-10-12 MED ORDER — ENOXAPARIN SODIUM 40 MG/0.4ML ~~LOC~~ SOLN
40.0000 mg | Freq: Two times a day (BID) | SUBCUTANEOUS | Status: DC
  Administered 2018-10-12: 40 mg via SUBCUTANEOUS
  Filled 2018-10-12: qty 0.4

## 2018-10-12 MED ORDER — ASPIRIN 81 MG PO CHEW
81.0000 mg | CHEWABLE_TABLET | Freq: Every day | ORAL | Status: DC
  Administered 2018-10-12 – 2018-10-17 (×6): 81 mg
  Filled 2018-10-12 (×6): qty 1

## 2018-10-12 MED ORDER — CHLORHEXIDINE GLUCONATE 0.12% ORAL RINSE (MEDLINE KIT)
15.0000 mL | Freq: Two times a day (BID) | OROMUCOSAL | Status: DC
  Administered 2018-10-12 – 2018-10-17 (×11): 15 mL via OROMUCOSAL

## 2018-10-12 MED ORDER — K PHOS MONO-SOD PHOS DI & MONO 155-852-130 MG PO TABS
500.0000 mg | ORAL_TABLET | Freq: Three times a day (TID) | ORAL | Status: DC
  Administered 2018-10-12 (×2): 500 mg
  Filled 2018-10-12 (×4): qty 2

## 2018-10-12 MED ORDER — ORAL CARE MOUTH RINSE
15.0000 mL | OROMUCOSAL | Status: DC
  Administered 2018-10-12 – 2018-10-16 (×43): 15 mL via OROMUCOSAL

## 2018-10-12 MED ORDER — PRO-STAT SUGAR FREE PO LIQD
30.0000 mL | Freq: Three times a day (TID) | ORAL | Status: DC
  Administered 2018-10-12 – 2018-10-16 (×12): 30 mL
  Filled 2018-10-12 (×13): qty 30

## 2018-10-12 NOTE — Progress Notes (Signed)
Received pt from [facility] 86[facility] via [facility]. Pt taken off their vent and placed on our with no complications. VS within normal limits. RT will continue to monitor

## 2018-10-12 NOTE — ED Triage Notes (Signed)
Brought by ems from [facility].  Per staff patient has been sob since 3pm.  When next shift came on checked O2 sat and found to be 68% on 2 L via [place].  Placed on non rebreather.  sats up to 98%.

## 2018-10-12 NOTE — ED Notes (Signed)
Respiratory therapist consulted pt. SpO2 86-88%. RT to come to evaluate pt.

## 2018-10-12 NOTE — Progress Notes (Signed)
Contacted pt sister, Kermit Mothershed, at 2045 via video chat.  Pt appropriately responsive to her sister with some delay in answers. Pt answers questions appropriately with head nodding when given multiple choices. Pt smiled and blew kisses to ger sister at the end of the video chat. Pt sister has no further questions at this time.

## 2018-10-12 NOTE — Progress Notes (Signed)
Video call provided by [employer]. Sister never joined the call even after I called her encouraging her to access the link. [facility] arrived so I needed the call. I made a second call to let the sister know the link would no longer be active and that the patient was now being transferred to [facility] hospital. We can attempt to do another call once patient arrives at [facility].

## 2018-10-12 NOTE — Progress Notes (Signed)
ANTICOAGULATION CONSULT NOTE - Initial Consult  Pharmacy Consult for warfarin Indication: atrial fibrillation  Allergies  Allergen Reactions  . Contrast Media [Iodinated Diagnostic Agents] Swelling  . Tomato Other (See Comments)    unknow per MAR  . Amoxicillin Diarrhea, Itching and Other (See Comments)    Has patient had a PCN reaction causing immediate rash, facial/tongue/throat swelling, SOB or lightheadedness with hypotension: No Has patient had a PCN reaction causing severe rash involving mucus membranes or skin necrosis: No Has patient had a PCN reaction that required hospitalization: No Has patient had a PCN reaction occurring within the last 10 years: Yes If all of the above answers are "NO", then may proceed with Cephalosporin use.     Patient Measurements: Height: 5\' 4"  (162.6 cm) Weight: (!) 345 lb 14.4 oz (156.9 kg) IBW/kg (Calculated) : 54.7  Vital Signs: Temp: 99.7 F (37.6 C) (05/03 1046) Temp Source: Axillary (05/03 1046) BP: 106/65 (05/03 1132) Pulse Rate: 81 (05/03 1132)  Labs: Recent Labs    10/12/18 0011 10/12/18 0433 10/12/18 0544 10/12/18 0551 10/12/18 1124  HGB 11.5* 11.4*  --  12.2 10.9*  HCT 41.7 40.9  --  36.0 32.0*  PLT 171 164  --   --   --   LABPROT  --   --  23.7*  --   --   INR  --   --  2.2*  --   --   CREATININE 1.27*  --   --   --   --   TROPONINI <0.03  --   --   --   --     Estimated Creatinine Clearance: 71.1 mL/min (A) (by C-G formula based on SCr of 1.27 mg/dL (H)).   Medical History: Past Medical History:  Diagnosis Date  . A-fib (HCC)   . Allergic rhinitis   . Bilateral primary osteoarthritis of knee   . Chronic respiratory failure (HCC)    on 2L home o2  . COPD (chronic obstructive pulmonary disease) (HCC)   . Diastolic heart failure (HCC)   . GERD (gastroesophageal reflux disease)   . Gout   . Hypertension   . Obesity   . Sleep apnea     Medications:  Medications Prior to Admission  Medication Sig  Dispense Refill Last Dose  . acetaminophen (TYLENOL) 325 MG tablet Take 650 mg by mouth every 4 (four) hours as needed for fever.   unknown  . acetaZOLAMIDE (DIAMOX) 250 MG tablet Take 250 mg by mouth 2 (two) times daily.  1 11-04-2018 at Unknown time  . albuterol (VENTOLIN HFA) 108 (90 Base) MCG/ACT inhaler Inhale 1-2 puffs into the lungs every 3 (three) hours as needed for wheezing or shortness of breath.   11-04-2018 at Unknown time  . aspirin EC 81 MG tablet Take 81 mg by mouth daily.   04-Nov-2018 at Unknown time  . CARTIA XT 240 MG 24 hr capsule Take 240 mg by mouth at bedtime.  0 10/10/2018 at Unknown time  . cefdinir (OMNICEF) 300 MG capsule Take 1 capsule (300 mg total) by mouth every 12 (twelve) hours for 11 doses. 11 capsule 0 2018-11-04 at Unknown time  . Cholecalciferol 25 MCG (1000 UT) tablet Take 2,000 Units by mouth daily.   Nov 04, 2018 at Unknown time  . Colchicine 0.6 MG CAPS Take 0.6 mg by mouth daily as needed (gout).   0 unknown  . diclofenac sodium (VOLTAREN) 1 % GEL Apply 2 g topically every 12 (twelve) hours as needed (for  gout apply bilateral knees for pain).   unknown  . doxycycline (VIBRA-TABS) 100 MG tablet Take 1 tablet (100 mg total) by mouth every 12 (twelve) hours for 11 doses. 11 tablet 0 11/03/2018 at Unknown time  . furosemide (LASIX) 80 MG tablet Take 80 mg by mouth daily. afternoon  1 10/22/2018 at Unknown time  . Insulin Glargine (BASAGLAR KWIKPEN) 100 UNIT/ML SOPN Inject 10 Units into the skin at bedtime.   10/10/2018 at Unknown time  . isosorbide mononitrate (IMDUR) 30 MG 24 hr tablet Take 30 mg by mouth daily.   10/31/2018 at Unknown time  . metoprolol tartrate (LOPRESSOR) 25 MG tablet Take 1 tablet (25 mg total) by mouth 2 (two) times daily.  0 10/23/2018 at 0900  . mometasone-formoterol (DULERA) 200-5 MCG/ACT AERO Inhale 2 puffs into the lungs 2 (two) times daily.   10/18/2018 at Unknown time  . nystatin (MYCOSTATIN/NYSTOP) powder Apply topically every 12 (twelve) hours as needed  (yeast). Under breast   unknown  . pantoprazole (PROTONIX) 40 MG tablet Take 40 mg by mouth daily.  11 10/13/2018 at Unknown time  . senna (SENOKOT) 8.6 MG tablet Take 2 tablets by mouth daily as needed for constipation.   unknown  . TRULICITY 1.5 MG/0.5ML SOPN Inject 1.5 mg into the skin every Saturday.   11 11/09/2018 at Unknown time  . umeclidinium bromide (INCRUSE ELLIPTA) 62.5 MCG/INH AEPB Inhale 1 puff into the lungs daily.   10/21/2018 at Unknown time  . warfarin (COUMADIN) 5 MG tablet Take 5 mg by mouth every Tuesday, Thursday, Saturday, and Sunday.    10/09/2018 at 2100  . warfarin (COUMADIN) 6 MG tablet Take 6 mg by mouth every Monday, Wednesday, and Friday. .   10/10/2018 at 2100  . Insulin Glargine (LANTUS) 100 UNIT/ML Solostar Pen Inject 10 Units into the skin at bedtime. (Patient not taking: Reported on 10/12/2018)   Not Taking at Unknown time    Assessment: 7 YOF with h/o Afib on warfarin admitted to ICU with hypercarbic respiratory failure. Pharmacy consulted to resume warfarin. INR on admission is therapeutic at 2.2. Her last dose of warfarin was 5/1.   H/H down, Plt wnl. No significant drug interaction noted.   Home warfarin regimen: 6 mg on Mon/Wed/Fri; 5 mg on other days   Goal of Therapy:  INR 2-3 Monitor platelets by anticoagulation protocol: Yes   Plan:  -D/c subQ Lovenox  -Warfarin 5 mg x 1 dose today  -Daily PT/INR  Vinnie Level, PharmD., BCPS Clinical Pharmacist Clinical phone for 10/12/18 until 4pm: [phone] If after 4pm, please refer to [facility] for unit-specific pharmacist

## 2018-10-12 NOTE — Progress Notes (Signed)
Assisted with camera/elink communication time between pt and her visitor

## 2018-10-12 NOTE — ED Notes (Signed)
Preparing patient to be intubated at this time.  Given Etomidate 20mg  and succs 150mg  IV push at 0303.  Successful intubation at 0305.  Info documented by RT.  OG inserted.  Placement checked via x-ray.  Shortly after intubating started coughing.  Blood noted in the mouth.  Suctioned using yankers.  Tooth found to be misplaced.  Gauze placed in mouth to stop bleeding.  Dr. Preston Fleeting made aware.

## 2018-10-12 NOTE — Progress Notes (Signed)
Morning ABG reported to MD in the box.

## 2018-10-12 NOTE — Progress Notes (Signed)
Patient arrived to the ICU at [facility] from [facility] campus. Patient has ET, Vented, Peak 8, T Vol 500, FiO2 50. Patient is rec., Fentanyl at .,  Patient is alert to name and date of birth.  She responds to voice.

## 2018-10-12 NOTE — Progress Notes (Signed)
Report given to [facility] RN at [facility]. Report given to [facility] as well. Pt was stable upon departure. Sister Journi Canipe was updated and attempted to video call prior to leaving. Please update sister once settled.

## 2018-10-12 NOTE — Progress Notes (Signed)
Brief Nutrition Note RD working remotely.  Consult received for enteral/tube feeding initiation and management.  Adult Enteral Nutrition Protocol initiated. Full assessment to follow.  Admitting Dx: Morbid obesity (HCC) [E66.01] Acute on chronic diastolic heart failure (HCC) [I50.33] Macrocytic anemia [D53.9] Acute on chronic respiratory failure with hypoxia and hypercapnia (HCC) [J96.21, J96.22] Chronic obstructive pulmonary disease, unspecified COPD type (HCC) [J44.9] Real time reverse transcriptase PCR positive for COVID-19 virus [U07.1]  Body mass index is 59.37 kg/m. Pt meets criteria for obesity class III based on current BMI.  Medications reviewed.  Enteral Access: 16 Fr. OGT placed 5/3; terminates in stomach per abdominal x-ray 5/3; cm marking on tube not documented at this time  Labs:  Recent Labs  Lab 10/07/18 1642  10/08/18 0636 10/09/18 0803 10/12/18 0011 10/12/18 0551 10/12/18 1124  NA 144   < > 142 141 141 139 139  K 4.5   < > 4.3 3.8 4.6 4.1 3.7  CL 101  --  104 99 98  --   --   CO2 35*  --  30 37* 34*  --   --   BUN 28*  --  27* 24* 25*  --   --   CREATININE 1.18*  --  1.08* 0.99 1.27*  --   --   CALCIUM 9.4  --  8.9 8.8* 9.1  --   --   MG 1.9  --   --   --   --   --   --   GLUCOSE 140*  --  124* 100* 129*  --   --    < > = values in this interval not displayed.    Helane Rima, MS, RD, LDN Pager: [phone] After Hours/Weekend Pager: [phone]

## 2018-10-12 NOTE — Progress Notes (Signed)
Spoke to patient's sister, Lashawndra Jankiewicz. Updated her on patient's condition. Answered all of her questions regarding patient. Told her to call any time with questions or concerns regarding patient.  Meleni Samaha would like to set up Video call with sister for later. Will notify E Link. Breiona Buchholz number is [phone]

## 2018-10-12 NOTE — Progress Notes (Signed)
RT note: patient does not meet SBT criteria at this time.  FIO2 currently at 60%.  Sputum sample obtained and sent down to main lab.  Will continue to monitor.

## 2018-10-12 NOTE — ED Notes (Signed)
Alexandra Schliep pts sister [phone] wants a pt update

## 2018-10-12 NOTE — Progress Notes (Addendum)
Initial Nutrition Assessment  RD working remotely.   DOCUMENTATION CODES:   Morbid obesity  INTERVENTION:  - will adjust TF regimen: Vital High Protein @ 45 ml/hr with 30 ml prostat TID. - this regimen will provide 1380 kcal, 139 grams protein, and 903 ml free water.  - free water flush, if desired, to be per MD.    NUTRITION DIAGNOSIS:   Inadequate oral intake related to inability to eat as evidenced by NPO status.  GOAL:   Provide needs based on ASPEN/SCCM guidelines  MONITOR:   Vent status, TF tolerance, Labs, Weight trends  REASON FOR ASSESSMENT:   Ventilator, Consult Enteral/tube feeding initiation and management  ASSESSMENT:   63 year old female with history of chronic respiratory failure, COPD on 2L O2 at baseline, OSA, morbid obesity, Afib on coumadin, diastolic heart failure, GERD, and HTN. Patient was recently hospitalized (4/28-4/30) d/t acute on chronic hypercarbic hypoxic respiratory failure. She had 2 negative COVID tests during that admission. Patient presented to the ED from [facility] with 1 day hx of increasing SOB and hypoxia requiring non-rebreather and then BiPAP. She was later intubated while still in the ED. COVID-19 testing done in the ED and was positive.   Patient remains intubated with OGT placed earlier today. TF protocol (Vital High Protein @ 40 ml/hr with with 30 ml prostat BID) started at noon today and provides 1160 kcal 114 grams protein, and 802 ml free water.   Will adjust TF regimen as outlined above. Will monitor for length of time on vent and/or any skin issues and may need to further adjust estimated nutrition needs. Current needs based on standard estimations per [facility] equation for BMI >50.     Per chart review, current weight is 346 lb which is up from 333 lb on 4/28. Prior to 4/28, most recent weight was 04/14/18 when she weighed 399 lb and on 03/09/18 when she weighed 366 lb. It appears that weight fluctuated drastically every few  months.  Per notes: acute on chronic respiratory failure with hypercapnia which is thought to be more so d/t volume overload than d/t COVID-19, acute metabolic encephalopathy, acute on CHF.    Patient is currently intubated on ventilator support MV: 10.1 L/min as of 11:30 AM Temp (24hrs), Avg:97.1 F (36.2 C), Min:90.1 F (32.3 C), Max:99.7 F (37.6 C) Propofol: none  Medications reviewed; 60 mg IV lasix x1 dose 5/3, 80 mg IV lasix x1 dose 5/3, 60 mg IV lasix BID, sliding scale novolog,  Labs reviewed; CBGs: 129, 131, and 119 mg/dl today, limited BMP is WDL.  Drip; fentanyl @ 100 mcg/hr.      NUTRITION - FOCUSED PHYSICAL EXAM:  unable to perform.  Diet Order:   Diet Order            Diet NPO time specified  Diet effective now              EDUCATION NEEDS:   No education needs have been identified at this time  Skin:  Skin Assessment: Reviewed RN Assessment  Last BM:  5/3  Height:   Ht Readings from Last 1 Encounters:  10/12/18 5\' 4"  (1.626 m)    Weight:   Wt Readings from Last 1 Encounters:  10/12/18 (!) 156.9 kg    Ideal Body Weight:  54.5 kg  BMI:  Body mass index is 59.37 kg/m.  Estimated Nutritional Needs:   Kcal:  1200-1362 kcal  Protein:  >/= 136 grams  Fluid:  >/= 1.8 L/day  Jarome Matin, MS, RD, LDN, [facility] Clinical Dietitian Pager # [phone] After hours/weekend pager # [phone]

## 2018-10-12 NOTE — Progress Notes (Signed)
Attending cardiology review of echo order:  Due to the spread of COVID-19, departmental policy is to review orders for procedures and determine appropriateness regarding testing at this time. The following criteria are being used to limit potential exposure and spread of infection.  Is the patient being evaluated for COVID-19 infection: YES, POSITIVE  Is the patient actively having infectious respiratory symptoms or fever: yes Does the patient have a known history of prior cardiovascular disease: reported HFpEF (echo 2010 care everywhere), CAD with history of MI, atrial fib Would the test change management of the patient: not at this time Can the test be performed at a later time: YES  Special circumstances: Is the patient undergoing evaluation for embolic CVA: no Is the patient planned for chemotherapy: no  Based on the review above, this study is not to be performed at this time.  Positive COVID patient with known COPD, chronic respiratory failure on home O2, resident of [facility] nursing home with recent admission for altered mental status and respiratory failure. Negative troponin. Concern that pulmonary edema is heart failure driven.  Given that she is COVID positive, we are not performing echo with sonographer and full machine, given the risk of cross contamination with other patients. If an EF estimate is needed, recommend handheld bedside ultrasound. If pictures are captured and there are questions about EF, please contact cardiology and we will be happy to assist with interpretation.  Jodelle Red, MD, PhD [facility]  [address], [address] [place], [place] [address] [phone]

## 2018-10-12 NOTE — ED Notes (Signed)
ED TO INPATIENT HANDOFF REPORT  ED Nurse Name and Phone #: [phone]  S Name/Age/Gender Emily Fry 63 y.o. female Room/Bed: 031C/031C  Code Status   Code Status: Full Code  Home/SNF/Other Nursing Home Patient oriented to: self, place, time Is this baseline? Yes   Triage Complete: Triage complete  Chief Complaint resp distress  Triage Note Brought by ems from [facility].  Per staff patient has been sob since 3pm.  When next shift came on checked O2 sat and found to be 68% on 2 L via [place].  Placed on non rebreather.  sats up to 98%.   Allergies Allergies  Allergen Reactions  . Contrast Media [Iodinated Diagnostic Agents] Swelling  . Tomato Other (See Comments)    unknow per MAR  . Amoxicillin Diarrhea, Itching and Other (See Comments)    Has patient had a PCN reaction causing immediate rash, facial/tongue/throat swelling, SOB or lightheadedness with hypotension: No Has patient had a PCN reaction causing severe rash involving mucus membranes or skin necrosis: No Has patient had a PCN reaction that required hospitalization: No Has patient had a PCN reaction occurring within the last 10 years: Yes If all of the above answers are "NO", then may proceed with Cephalosporin use.     Level of Care/Admitting Diagnosis ED Disposition    ED Disposition Condition [facility] Area: [place] [[id number]]  Level of Care: ICU [6]  Covid Evaluation: Confirmed COVID Positive  Isolation Risk Level: High Risk/Airborne (Aerosolizing procedure, nebulizer, intubated/ventilation, CPAP/BiPAP)  Diagnosis: Acute respiratory failure with hypoxia [facility]) [[id number]]  Admitting Physician: [name] [[id number]]  Attending Physician: [name] [phone]  Estimated length of stay: 5 - 7 days  Certification:: I certify this patient will need inpatient services for at least 2 midnights  PT Class (Do Not Modify): Inpatient [101]  PT Acc Code (Do Not Modify):  Private [1]       B Medical/Surgery History Past Medical History:  Diagnosis Date  . A-fib ([place])   . Allergic rhinitis   . Bilateral primary osteoarthritis of knee   . Chronic respiratory failure (HCC)    on 2L home o2  . COPD (chronic obstructive pulmonary disease) ([place])   . Diastolic heart failure ([place])   . GERD (gastroesophageal reflux disease)   . Gout   . Hypertension   . Obesity   . Sleep apnea    Past Surgical History:  Procedure Laterality Date  . TONSILLECTOMY       A IV Location/Drains/Wounds Patient Lines/Drains/Airways Status   Active Line/Drains/Airways    Name:   Placement date:   Placement time:   Site:   Days:   Peripheral IV 10/07/18 Left Antecubital   10/07/18    1900    Antecubital   5   Peripheral IV 10/12/18 Left Wrist   10/12/18    0049    Wrist   less than 1   Peripheral IV 10/12/18 Left;Anterior Forearm   10/12/18    0123    Forearm   less than 1   NG/OG Tube Orogastric 16 Fr. Center mouth Xray;Aucultation   10/12/18    0320    Center mouth   less than 1   Urethral Catheter Esbeyde RN Temperature probe 14 Fr.   10/12/18    0400    Temperature probe   less than 1   External Urinary Catheter   10/08/18    0844    -  4   Airway 7.5 mm   10/12/18    0315     less than 1          Intake/Output Last 24 hours  Intake/Output Summary (Last 24 hours) at 10/12/2018 0429 Last data filed at 10/12/2018 0423 Gross per 24 hour  Intake -  Output 500 ml  Net -500 ml    Labs/Imaging Results for orders placed or performed during the hospital encounter of 11/06/2018 (from the past 48 hour(s))  Blood gas, arterial     Status: Abnormal   Collection Time: 10/12/18 12:10 AM  Result Value Ref Range   FIO2 100.00    Delivery systems NON-REBREATHER OXYGEN MASK    pH, Arterial 7.053 (LL) 7.350 - 7.450    Comment: CRITICAL RESULT CALLED TO, READ BACK BY AND VERIFIED WITH: WHITLEY STOKENHAUER, RRT,RCP AT 0031 BY KATEY FARGIS,RRT,RCP ON 10/12/2018    pCO2 arterial  ABOVE REPORTABLE RANGE 32.0 - 48.0 mmHg    Comment: CRITICAL RESULT CALLED TO, READ BACK BY AND VERIFIED WITH: WHITLEY STOCKENHAUER, RRT,RCP AT 0031 BY KATEY FARGIS,RRT,RCP ON 10/12/2018    pO2, Arterial 113 (H) 83.0 - 108.0 mmHg   Bicarbonate 39.0 (H) 20.0 - 28.0 mmol/L   Acid-Base Excess 8.5 (H) 0.0 - 2.0 mmol/L   O2 Saturation 96.8 %   Patient temperature 98.0    Collection site RADIAL    Drawn by [phone]    Sample type ARTERIAL DRAW    Allens test (pass/fail) PASS PASS  SARS Coronavirus 2 [facility] order, Performed in [place] hospital lab)     Status: Abnormal   Collection Time: 10/12/18 12:11 AM  Result Value Ref Range   SARS Coronavirus 2 POSITIVE (A) NEGATIVE    Comment: RESULT CALLED TO, READ BACK BY AND VERIFIED WITH: K MUNNET _0  10/12/18 BY S GEZAHEGN (NOTE) If result is NEGATIVE SARS-CoV-2 target nucleic acids are NOT DETECTED. The SARS-CoV-2 RNA is generally detectable in upper and lower  respiratory specimens during the acute phase of infection. The lowest  concentration of SARS-CoV-2 viral copies this assay can detect is 250  copies / mL. A negative result does not preclude SARS-CoV-2 infection  and should not be used as the sole basis for treatment or other  patient management decisions.  A negative result may occur with  improper specimen collection / handling, submission of specimen other  than nasopharyngeal swab, presence of viral mutation(s) within the  areas targeted by this assay, and inadequate number of viral copies  (<250 copies / mL). A negative result must be combined with clinical  observations, patient history, and epidemiological information. If result is POSITIVE SARS-CoV-2 target nucleic acids are DETECTED. T he SARS-CoV-2 RNA is generally detectable in upper and lower  respiratory specimens during the acute phase of infection.  Positive  results are indicative of active infection with SARS-CoV-2.  Clinical  correlation with patient history  and other diagnostic information is  necessary to determine patient infection status.  Positive results do  not rule out bacterial infection or co-infection with other viruses. If result is PRESUMPTIVE POSTIVE SARS-CoV-2 nucleic acids MAY BE PRESENT.   A presumptive positive result was obtained on the submitted specimen  and confirmed on repeat testing.  While 2019 novel coronavirus  (SARS-CoV-2) nucleic acids may be present in the submitted sample  additional confirmatory testing may be necessary for epidemiological  and / or clinical management purposes  to differentiate between  SARS-CoV-2 and other Sarbecovirus currently known to infect humans.  If clinically indicated additional testing with an alternate test  methodology [phone]) is  advised. The SARS-CoV-2 RNA is generally  detectable in upper and lower respiratory specimens during the acute  phase of infection. The expected result is Negative. Fact Sheet for Patients:  StrictlyIdeas.no Fact Sheet for Healthcare Providers: BankingDealers.co.za This test is not yet approved or cleared by the [place] FDA and has been authorized for detection and/or diagnosis of SARS-CoV-2 by FDA under an Emergency Use Authorization (EUA).  This EUA will remain in effect (meaning this test can be used) for the duration of the COVID-19 declaration under Section 564(b)(1) of the Act, 21 U.S.C. section 360bbb-3(b)(1), unless the authorization is terminated or revoked sooner. Performed at [facility], [place] [address] [place], [place] [address]   Lactic acid, plasma     Status: None   Collection Time: 10/12/18 12:11 AM  Result Value Ref Range   Lactic Acid, Venous 0.5 0.5 - 1.9 mmol/L    Comment: Performed at [place] [address] [place], [place] [address]  CBC WITH DIFFERENTIAL     Status: Abnormal   Collection Time: 10/12/18 12:11 AM  Result Value Ref Range   WBC 7.1 4.0  - 10.5 K/uL   RBC 4.07 3.87 - 5.11 MIL/uL   Hemoglobin 11.5 (L) 12.0 - 15.0 g/dL   HCT 41.7 36.0 - 46.0 %   MCV 102.5 (H) 80.0 - 100.0 fL   MCH 28.3 26.0 - 34.0 pg   MCHC 27.6 (L) 30.0 - 36.0 g/dL   RDW 14.6 11.5 - 15.5 %   Platelets 171 150 - 400 K/uL   nRBC 0.7 (H) 0.0 - 0.2 %   Neutrophils Relative % 80 %   Neutro Abs 5.7 1.7 - 7.7 K/uL   Lymphocytes Relative 9 %   Lymphs Abs 0.6 (L) 0.7 - 4.0 K/uL   Monocytes Relative 9 %   Monocytes Absolute 0.6 0.1 - 1.0 K/uL   Eosinophils Relative 1 %   Eosinophils Absolute 0.1 0.0 - 0.5 K/uL   Basophils Relative 0 %   Basophils Absolute 0.0 0.0 - 0.1 K/uL   Immature Granulocytes 1 %   Abs Immature Granulocytes 0.08 (H) 0.00 - 0.07 K/uL    Comment: Performed at [facility], [address], [place], [place] [address]  Comprehensive metabolic panel     Status: Abnormal   Collection Time: 10/12/18 12:11 AM  Result Value Ref Range   Sodium 141 135 - 145 mmol/L   Potassium 4.6 3.5 - 5.1 mmol/L   Chloride 98 98 - 111 mmol/L   CO2 34 (H) 22 - 32 mmol/L   Glucose, Bld 129 (H) 70 - 99 mg/dL   BUN 25 (H) 6 - 20 mg/dL   Creatinine, Ser 1.27 (H) 0.44 - 1.00 mg/dL   Calcium 9.1 8.9 - 10.3 mg/dL   Total Protein 6.7 6.5 - 8.1 g/dL   Albumin 3.1 (L) 3.5 - 5.0 g/dL   AST 39 15 - 41 U/L   ALT 33 0 - 44 U/L   Alkaline Phosphatase 117 38 - 126 U/L   Total Bilirubin 0.6 0.3 - 1.2 mg/dL   GFR calc non Af Amer 46 (L) >60 mL/min   GFR calc Af Amer 53 (L) >60 mL/min   Anion gap 9 5 - 15    Comment: Performed at [facility] [address], [place], [place] [address]  D-dimer, quantitative     Status:  None   Collection Time: 10/12/18 12:11 AM  Result Value Ref Range   D-Dimer, Quant 0.48 0.00 - 0.50 ug/mL-FEU    Comment: (NOTE) At the manufacturer cut-off of 0.50 ug/mL FEU, this assay has been documented to exclude PE with a sensitivity and negative predictive value of 97 to 99%.  At this time, this assay has not been approved by the  FDA to exclude DVT/VTE. Results should be correlated with clinical presentation. Performed at [facility], [place] [address], [place], [place] [address]   Procalcitonin     Status: None   Collection Time: 10/12/18 12:11 AM  Result Value Ref Range   Procalcitonin 0.16 ng/mL    Comment:        Interpretation: PCT (Procalcitonin) <= 0.5 ng/mL: Systemic infection (sepsis) is not likely. Local bacterial infection is possible. (NOTE)       Sepsis PCT Algorithm           Lower Respiratory Tract                                      Infection PCT Algorithm    ----------------------------     ----------------------------         PCT < 0.25 ng/mL                PCT < 0.10 ng/mL         Strongly encourage             Strongly discourage   discontinuation of antibiotics    initiation of antibiotics    ----------------------------     -----------------------------       PCT 0.25 - 0.50 ng/mL            PCT 0.10 - 0.25 ng/mL               OR       >80% decrease in PCT            Discourage initiation of                                            antibiotics      Encourage discontinuation           of antibiotics    ----------------------------     -----------------------------         PCT >= 0.50 ng/mL              PCT 0.26 - 0.50 ng/mL               AND        <80% decrease in PCT             Encourage initiation of                                             antibiotics       Encourage continuation           of antibiotics    ----------------------------     -----------------------------        PCT >= 0.50 ng/mL  PCT > 0.50 ng/mL               AND         increase in PCT                  Strongly encourage                                      initiation of antibiotics    Strongly encourage escalation           of antibiotics                                     -----------------------------                                           PCT <= 0.25 ng/mL                                                  OR                                        > 80% decrease in PCT                                     Discontinue / Do not initiate                                             antibiotics Performed at [facility], [address], [place], [place] [address]   Lactate dehydrogenase     Status: Abnormal   Collection Time: 10/12/18 12:11 AM  Result Value Ref Range   LDH 284 (H) 98 - 192 U/L    Comment: Performed at [facility], [place] [address], [place], [place] [address]  Ferritin     Status: None   Collection Time: 10/12/18 12:11 AM  Result Value Ref Range   Ferritin 65 11 - 307 ng/mL    Comment: Performed at [facility], [place] [address] [place], [place] [address]  Triglycerides     Status: None   Collection Time: 10/12/18 12:11 AM  Result Value Ref Range   Triglycerides 86 <150 mg/dL    Comment: Performed at [place] [address] [place], [place] [address]  Fibrinogen     Status: Abnormal   Collection Time: 10/12/18 12:11 AM  Result Value Ref Range   Fibrinogen 653 (H) 210 - 475 mg/dL    Comment: Performed at [facility] [address] [place], [place] [address]  C-reactive protein     Status: None   Collection Time: 10/12/18 12:11 AM  Result Value Ref Range   CRP <0.8 <1.0 mg/dL    Comment: Performed at [facility], [place] [address], [place], [place] [address]  Brain natriuretic peptide     Status: Abnormal   Collection Time: 10/12/18 12:11 AM  Result Value Ref Range   B Natriuretic Peptide 201.2 (H) 0.0 - 100.0 pg/mL    Comment: Performed at [facility] [address], [place], [place] [address]  Troponin I - Once     Status: None   Collection Time: 10/12/18 12:11 AM  Result Value Ref Range   Troponin I <0.03 <0.03 ng/mL    Comment: Performed at [facility] [address], [place], [place] [address]   Dg Chest Port 1 View  Result Date: 10/12/2018 CLINICAL DATA:  Altered mental  status, shortness of breath EXAM: PORTABLE CHEST 1 VIEW COMPARISON:  10/07/2018 FINDINGS: Cardiomegaly. Diffuse bilateral airspace disease, favor edema/CHF. Suspect layering effusions. Very low lung volumes. No acute bony abnormality. IMPRESSION: Cardiomegaly with severe diffuse bilateral airspace disease and suspected layering effusions, likely edema/CHF. Low lung volumes. Electronically Signed   By: Rolm Baptise M.D.   On: 10/12/2018 01:01    Pending Labs Unresulted Labs (From admission, onward)    Start     Ordered   10/21/2018 0500  Creatinine, serum  (enoxaparin (LOVENOX)    CrCl >/= 30 ml/min)  Weekly,   R    Comments:  while on enoxaparin therapy    10/12/18 0357   10/12/18 0500  Protime-INR  Daily,   R     10/12/18 0359   10/12/18 0404  Culture, respiratory (non-expectorated)  Once,   R     10/12/18 0404   10/12/18 0352  CBC  (enoxaparin (LOVENOX)    CrCl >/= 30 ml/min)  Once,   R    Comments:  Baseline for enoxaparin therapy IF NOT ALREADY DRAWN.  Notify MD if PLT < 100 K.    10/12/18 0357   10/12/18 0351  ABO/Rh  Once,   R     10/12/18 0357   10/28/2018 2352  Lactic acid, plasma  Now then every 2 hours,   STAT     10/31/2018 2352   10/28/2018 2352  Blood Culture (routine x 2)  BLOOD CULTURE X 2,   STAT     10/18/2018 2352          Vitals/Pain Today's Vitals   10/12/18 0300 10/12/18 0315 10/12/18 0345 10/12/18 0350  BP: 131/63 1_0  Pulse: 78 76 71 66  Resp:  _1 Temp:   (!) 97.3 F (36.3 C) (!) 97.3 F (36.3 C)  TempSrc:      SpO2: 93% 100% 99% 98%  Weight:      Height:      PainSc:        Isolation Precautions Airborne and Contact precautions  Medications Medications  propofol (DIPRIVAN) 1000 MG/100ML infusion (has no administration in time range)  fentaNYL (SUBLIMAZE) 100 MCG/2ML injection (has no administration in time range)  midazolam (VERSED) 2 MG/2ML injection (has no administration in time range)  enoxaparin (LOVENOX) injection 40 mg  (has no administration in time range)  chlorhexidine gluconate (MEDLINE KIT) (PERIDEX) 0.12 % solution 15 mL (has no administration in time range)  MEDLINE mouth rinse (has no administration in time range)  pantoprazole (PROTONIX) injection 40 mg (has no administration in time range)  fentaNYL (SUBLIMAZE) injection 50 mcg (has no administration in time range)  fentaNYL 2577mg in NS 2530m(1057mml) infusion-PREMIX (100 mcg/hr Intravenous New Bag/Given 10/12/18 0420)  fentaNYL (  SUBLIMAZE) bolus via infusion 50 mcg (has no administration in time range)  midazolam (VERSED) injection 2 mg (has no administration in time range)  midazolam (VERSED) injection 2 mg (has no administration in time range)  docusate (COLACE) 50 MG/5ML liquid 100 mg (has no administration in time range)  bisacodyl (DULCOLAX) suppository 10 mg (has no administration in time range)  furosemide (LASIX) injection 80 mg (40 mg Intravenous Given 10/12/18 0229)    Mobility non-ambulatory Low fall risk   Focused Assessments Pulmonary Assessment Handoff:  Lung sounds: L Breath Sounds: Diminished R Breath Sounds: Diminished          R Recommendations: See Admitting Provider Note  Report given to:   Additional Notes: Tooth dislodged when partial plate was removed.  Some bleeding noted to the mouth.  Packing placed in front of mouth to stop bleeding.

## 2018-10-12 NOTE — Progress Notes (Signed)
Discussed at length with patient, the plan of care and the plan for the evening. She shows evidence of learning but has some stm loss in regards to her care.  Pt is alert, restless, hot, shakey, and unable to rest comfortably. She also c/o itchiness on her abdomen. Rewashed her skin with just warm water, pt seems to have relient.  Even with the increase in her fentanyl gtt, pt remains restless and grimacing. After versed push, pt finally resting. But still arouses easily.

## 2018-10-12 NOTE — Progress Notes (Signed)
[place] pulmonary and critical care medicine follow-up note  Subjective: Admitted earlier today, has history of obstructive sleep apnea, hypertension, morbid obesity, diastolic heart failure, COPD and chronic respiratory failure on 2 L of oxygen at home.  Had been admitted on the family practice teaching service and was discharged on April 30.  During that time she was treated for hypercarbic respiratory failure which was felt to be due to COPD and her obstructive sleep apnea.  They also treated her for community-acquired pneumonia with cefdinir and doxycycline.  She returned to the emergency room on May 2 in the evening with hypercarbic respiratory failure ultimately requiring intubation and mechanical ventilation.  After intubation she remained hypercarbic.  Past Medical History:  Diagnosis Date  . A-fib (HCC)   . Allergic rhinitis   . Bilateral primary osteoarthritis of knee   . Chronic respiratory failure (HCC)    on 2L home o2  . COPD (chronic obstructive pulmonary disease) (HCC)   . Diastolic heart failure (HCC)   . GERD (gastroesophageal reflux disease)   . Gout   . Hypertension   . Obesity   . Sleep apnea      On exam: Morbidly obese Resting comfortably on ventilator General as above HEENT thick neck, endotracheal tube in place NCAT Pulmonary few crackles in bases, ventilator assisted respiratory effort, normal air movement Cardiovascular regular rate and rhythm no murmurs gallops rubs Belly soft nontender Dermatologic/extremities: Thick edema bilaterally Extremities are warm, well-perfused pulses are 2+ Neuro sedated on ventilator  Chest x-ray images independently reviewed showing bilateral infiltrates, massive cardiomegaly, ET tube in place  Lab work shows a creatinine of 1.27  White count is 5.5  Impression: COVID Acute cardiogenic pulmonary edema Possible ARDS Possible pneumonia from COVID Morbid obesity Acute on chronic respiratory failure with  hypercarbia Obstructive sleep apnea Chronic kidney disease  Plan: Continue Lasix Continue fentanyl and Versed titrated to an RA SS score of -1 to -2 Continue full mechanical ventilatory support, we adjusted the increased respiratory rate Monitor PaO2 to FiO2 ratio: I am hoping to see improvement with this by increasing the PEEP and diuresing.  However, if oxygenation continues to worsen despite diuresis then this would more suggest that she has ARDS rather than cardiogenic pulmonary edema.  At this time her physical exam supports a diagnosis of cardiogenic pulmonary edema Continue low tidal volume ventilation Monitor plateau pressure Start tube feeding  Additional CC time by me 40 minutes  [place], MD [place] [facility] Pager: [phone] Cell: [phone] If no response, call [phone]

## 2018-10-12 NOTE — ED Notes (Signed)
IV team at bedside 

## 2018-10-12 NOTE — H&P (Signed)
NAME:  Emily Fry, MRN:  [id number], DOB:  [birth year], LOS: 0 ADMISSION DATE:  10/24/2018, CONSULTATION DATE:  10/12/2018 REFERRING MD:  Dr. Preston Fleeting, CHIEF COMPLAINT:  SOB/ hypoxic  Brief History   63 year old female from [facility] presenting with increasing SOB day and hypoxia since yesterday.  Severe bilateral airspace disease. COVID positive. Intubated.    History of present illness   HPI obtained from medical chart review as patient is intubated on mechanical ventilation.   63 year old female with history of chronic respiratory failure, COPD on baseline 2L, OSA, morbid obesity, Afib on coumadin, diastolic heart failure, GERD, HTN who presents from [facility] nursing [facility] with one day history of increasing shortness of breath and hypoxia.    Recent hospitalization 4/28- 4/30 acute on chronic hypercarbic/ hypoxic respiratory failure with two negative COVID test.   She presented afebrile, normotensive but requiring NRB.  CXR showed severe bilateral airspace disease.  Treated with lasix 80 mg x 1.  Labs noted for LDH 284, fibrinogen 653, BNP 201, sCr 1.27, lymphopenia, and ABG on NRB 7.053/ 113 /39/ 39.  She was intubated in ER.  During intubation, a tooth was dislodged with temporary bleeding in which has since abated.  PCCM called for admit.    Past Medical History  COPD, OSA, chronic hypoxemic/ hypercapneic respiratory failure, A-fib, Allergic rhinitis, Bilateral primary osteoarthritis of knee, Diastolic heart failure, GERD (gastroesophageal reflux disease), Gout, Hypertension, morbid obesity  Significant Hospital Events   5/2 Admitted   Consults:   Procedures:  5/3 ETT >>  Significant Diagnostic Tests:  5/2 CXR >> Cardiomegaly with severe diffuse bilateral airspace disease and suspected layering effusions, likely edema/CHF. Low lung volumes.  Micro Data:  5/3 BCx 2 >> 5/3 COVID 19 >> POSITIVE 5/3 trach asp >>  Antimicrobials:   Interim history/subjective:  On propofol  10 mcg/kg/min and status post fentanyl 100 mcg and versed 5 mg with soft blood pressure  Objective   Blood pressure 103/61, pulse 77, temperature 98 F (36.7 C), temperature source Oral, resp. rate 19, height 5\' 4"  (1.626 m), weight (!) 150.9 kg, SpO2 95 %.    Vent Mode: PRVC FiO2 (%):  [100 %] 100 % Set Rate:  [14 bmp] 14 bmp Vt Set:  [500 mL] 500 mL PEEP:  [8 cmH20] 8 cmH20 Plateau Pressure:  [29 cmH20] 29 cmH20  No intake or output data in the 24 hours ending 10/12/18 0339 Filed Weights   10/12/18 0007  Weight: (!) 150.9 kg   Examination:  per Dr. Rande Lawman  [facility] Problem list    Assessment & Plan:   Acute on chronic hypoxic and hypercarbic respiratory failure  COVID 19 COPD OSA R/o HCAP - pre-intubation PF ratio 113 P:  ARDS protocol, will assess ABG on current 8 cc/kg, rate 14 and make changes Follow PIP, Pplat, and driving pressures  Insert art line, ABG as needed combivent MDI q 6 prn  Trend CXR Daily covid labs- trop, CRP, ferritin, fibrinogen, Ddimer, LFTs, BMP, CBC Check urine strep and legionella, RVP Check quantiferon Gold and IL-6 VAP bundle  Further diuresis when able Airborne and contact precautions Send trach asp Follow BC Hold empiric abx given afebrile, reassuring PCT 0.16  AKI P:  S/p lasix 80 mg in ER Insert foley Trend BMP / urinary output Replace electrolytes as indicated Avoid nephrotoxic agents, ensure adequate renal perfusion  HFpEF Hx HTN P:  Ideally needs ongoing diuresis but given soft BP, will hold for now  Hold home metoprolol, imdur, cartia XT Will need to consult Cards in am for bedside echo- last TTE in 2010 at [facility] Trend troponin Daily ASA  Afib on coumadin P:  Tele monitoring Assess INR Hold coumadin Hold cartia and metroprolol given blood pressure  DM P:  CBG q 4 SSI moderate Consider adding levemir if glucose > 180  Anemia P:  Stable Hgb from previous admit Trend CBC  Best practice:  Diet:  NPO Pain/Anxiety/Delirium protocol (if indicated): fentanyl gtt/ versed VAP protocol (if indicated): yes DVT prophylaxis: Lovenox BID (given BMI >40) GI prophylaxis: PPI Glucose control: SSI Mobility: BR Code Status: full  Family Communication: patients sister (only [facility]) Annice Pih [phone] called and updated.  Disposition: ICU  Labs   CBC: Recent Labs  Lab 10/07/18 1642 10/07/18 1658 10/08/18 0636 10/09/18 0803 10/12/18 0011  WBC 8.6  --  6.8 5.4 7.1  NEUTROABS 6.8  --  5.0  --  5.7  HGB 12.0 12.9 10.9* 10.8* 11.5*  HCT 41.8 38.0 38.9 37.0 41.7  MCV 101.5*  --  99.7 97.1 102.5*  PLT 184  --  149* 159 171    Basic Metabolic Panel: Recent Labs  Lab 10/07/18 1642 10/07/18 1658 10/08/18 0636 10/09/18 0803 10/12/18 0011  NA 144 143 142 141 141  K 4.5 4.4 4.3 3.8 4.6  CL 101  --  104 99 98  CO2 35*  --  30 37* 34*  GLUCOSE 140*  --  124* 100* 129*  BUN 28*  --  27* 24* 25*  CREATININE 1.18*  --  1.08* 0.99 1.27*  CALCIUM 9.4  --  8.9 8.8* 9.1  MG 1.9  --   --   --   --    GFR: Estimated Creatinine Clearance: 69.3 mL/min (A) (by C-G formula based on SCr of 1.27 mg/dL (H)). Recent Labs  Lab 10/07/18 1642 10/08/18 0001 10/08/18 0636 10/09/18 0502 10/09/18 0803 10/12/18 0011  PROCALCITON  --  <0.10 <0.10 0.16  --  0.16  WBC 8.6  --  6.8  --  5.4 7.1  LATICACIDVEN  --   --   --   --   --  0.5    Liver Function Tests: Recent Labs  Lab 10/07/18 1642 10/08/18 0636 10/12/18 0011  AST 20 18 39  ALT 20 17 33  ALKPHOS 141* 119 117  BILITOT 0.4 0.6 0.6  PROT 6.6 5.9* 6.7  ALBUMIN 3.1* 2.8* 3.1*   No results for input(s): LIPASE, AMYLASE in the last 168 hours. Recent Labs  Lab 10/08/18 0002  AMMONIA 38*    ABG    Component Value Date/Time   PHART 7.053 (LL) 10/12/2018 0010   PCO2ART ABOVE REPORTABLE RANGE 10/12/2018 0010   PO2ART 113 (H) 10/12/2018 0010   HCO3 39.0 (H) 10/12/2018 0010   TCO2 38 (H) 10/07/2018 1658   O2SAT 96.8 10/12/2018 0010      Coagulation Profile: Recent Labs  Lab 10/07/18 1642 10/08/18 0636 10/09/18 0502  INR 2.3* 2.4* 2.7*    Cardiac Enzymes: Recent Labs  Lab 10/08/18 0001 10/12/18 0011  TROPONINI <0.03 <0.03    HbA1C: Hgb A1c MFr Bld  Date/Time Value Ref Range Status  08/25/2015 02:14 AM 11.9 (H) 4.0 - 6.0 % Final    CBG: Recent Labs  Lab 10/08/18 1719 10/08/18 2158 10/09/18 0201 10/09/18 0700 10/09/18 1148  GLUCAP 105* 97 77 93 79    Review of Systems:   unable  Past Medical History  She,  has a past medical history of A-fib (HCC), Allergic rhinitis, Bilateral primary osteoarthritis of knee, Chronic respiratory failure (HCC), COPD (chronic obstructive pulmonary disease) (HCC), Diastolic heart failure (HCC), GERD (gastroesophageal reflux disease), Gout, Hypertension, Obesity, and Sleep apnea.   Surgical History    Past Surgical History:  Procedure Laterality Date  . TONSILLECTOMY       Social History   reports that she has quit smoking. She has never used smokeless tobacco. She reports that she does not drink alcohol or use drugs.   Family History   Her family history includes Cancer in her father and mother; Lung cancer in her father; Throat cancer in her mother.   Allergies Allergies  Allergen Reactions  . Contrast Media [Iodinated Diagnostic Agents] Swelling  . Tomato Other (See Comments)    unknow per MAR  . Amoxicillin Diarrhea, Itching and Other (See Comments)    Has patient had a PCN reaction causing immediate rash, facial/tongue/throat swelling, SOB or lightheadedness with hypotension: No Has patient had a PCN reaction causing severe rash involving mucus membranes or skin necrosis: No Has patient had a PCN reaction that required hospitalization: No Has patient had a PCN reaction occurring within the last 10 years: Yes If all of the above answers are "NO", then may proceed with Cephalosporin use.      Home Medications  Prior to Admission medications    Medication Sig Start Date End Date Taking? Authorizing Provider  acetaminophen (TYLENOL) 325 MG tablet Take 650 mg by mouth every 4 (four) hours as needed for fever.   Yes [provider]  acetaZOLAMIDE (DIAMOX) 250 MG tablet Take 250 mg by mouth 2 (two) times daily. 02/07/18  Yes [provider]  albuterol (VENTOLIN HFA) 108 (90 Base) MCG/ACT inhaler Inhale 1-2 puffs into the lungs every 3 (three) hours as needed for wheezing or shortness of breath. 10/09/18  Yes Meccariello, Solmon IceBailey J, DO  aspirin EC 81 MG tablet Take 81 mg by mouth daily.   Yes [provider]  CARTIA XT 240 MG 24 hr capsule Take 240 mg by mouth at bedtime. 07/27/15  Yes [provider]  cefdinir (OMNICEF) 300 MG capsule Take 1 capsule (300 mg total) by mouth every 12 (twelve) hours for 11 doses. 10/09/18 10/15/18 Yes Meccariello, Solmon IceBailey J, DO  Cholecalciferol 25 MCG (1000 UT) tablet Take 2,000 Units by mouth daily.   Yes [provider]  Colchicine 0.6 MG CAPS Take 0.6 mg by mouth daily as needed (gout).  04/04/18  Yes [provider]  diclofenac sodium (VOLTAREN) 1 % GEL Apply 2 g topically every 12 (twelve) hours as needed (for gout apply bilateral knees for pain).   Yes [provider]  doxycycline (VIBRA-TABS) 100 MG tablet Take 1 tablet (100 mg total) by mouth every 12 (twelve) hours for 11 doses. 10/09/18 10/15/18 Yes Meccariello, Solmon IceBailey J, DO  furosemide (LASIX) 80 MG tablet Take 80 mg by mouth daily. afternoon   Yes [provider]  Insulin Glargine (BASAGLAR KWIKPEN) 100 UNIT/ML SOPN Inject 10 Units into the skin at bedtime.   Yes [provider]  isosorbide mononitrate (IMDUR) 30 MG 24 hr tablet Take 30 mg by mouth daily.   Yes [provider]  metoprolol tartrate (LOPRESSOR) 25 MG tablet Take 1 tablet (25 mg total) by mouth 2 (two) times daily. 10/09/18  Yes Meccariello, Solmon IceBailey J, DO  mometasone-formoterol (DULERA) 200-5 MCG/ACT AERO Inhale  2 puffs into the lungs 2 (two) times daily. 10/09/18  Yes Meccariello, Solmon Ice, DO  nystatin (MYCOSTATIN/NYSTOP) powder Apply topically every 12 (twelve) hours as needed (yeast). Under breast   Yes [provider]  pantoprazole (PROTONIX) 40 MG tablet Take 40 mg by mouth daily. 08/18/15  Yes [provider]  senna (SENOKOT) 8.6 MG tablet Take 2 tablets by mouth daily as needed for constipation.   Yes [provider]  TRULICITY 1.5 MG/0.5ML SOPN Inject 1.5 mg into the skin every Saturday.  03/23/18  Yes [provider]  umeclidinium bromide (INCRUSE ELLIPTA) 62.5 MCG/INH AEPB Inhale 1 puff into the lungs daily. 10/10/18  Yes Meccariello, Solmon Ice, DO  warfarin (COUMADIN) 5 MG tablet Take 5 mg by mouth every Tuesday, Thursday, Saturday, and Sunday.  10/05/18  Yes [provider]  warfarin (COUMADIN) 6 MG tablet Take 6 mg by mouth every Monday, Wednesday, and Friday. . 10/06/18  Yes [provider]  Insulin Glargine (LANTUS) 100 UNIT/ML Solostar Pen Inject 10 Units into the skin at bedtime. Patient not taking: Reported on 10/12/2018 10/09/18   Meccariello, Solmon Ice, DO     Critical care time: 60 mins    Posey Boyer, MSN, AGACNP-BC [facility] Pgr: [phone] or if no answer [phone] 10/12/2018, 5:13 AM

## 2018-10-12 NOTE — Progress Notes (Signed)
PROGRESS NOTE                                                                                                                                                                                                             Patient Demographics:    Emily Fry, is a 63 y.o. female, DOB - 1955-08-10, YEB:[id number]  Admit date - 10/23/2018   Admitting Physician Albertine Patricia, MD  Outpatient Primary MD for the patient is Kirk Ruths, MD  LOS - 0   Chief Complaint  Patient presents with   Shortness of Breath   Altered Mental Status       Brief Narrative    This is a no charge note as patient admitted earlier today.  63 year old female with history of chronic respiratory failure, COPD on baseline 2L, OSA, morbid obesity, Afib on coumadin, diastolic heart failure, GERD, HTN who presents from [facility] home with one day history of increasing shortness of breath and hypoxia.    Recent hospitalization 4/28- 4/30 acute on chronic hypercarbic/ hypoxic respiratory failure with two negative COVID test.  -Presents to ED with significant dyspnea, hypoxia, requiring nonrebreather, initially in ED she did require BiPAP, she was intubated in ED, she tested positive for COVID-19.       Subjective:    Emily Fry is intubated earlier this morning.   Assessment  & Plan :    Active Problems:   Acute respiratory failure with hypoxia (HCC)  Acute on chronic respiratory failure with hypercapnia -She was intubated earlier by PCCM, vent management per PCCM, it was felt secondary to volume overload, more than actual COVID-19 infection.  Patiently had remained respiratory failure pattern as hypercapnia more than hypoxia, -Continue with diuresis  COVID19 infection  Acte Metabolic Encephalopathy -in the Setting of hypercapnia   Atrial fibrillation -Heart Rate controlled, blood pressure is soft, will hold Cardizem and metoprolol, if needed  will give PRN digoxin and amiodarone  - continue with warfarin, pharmacy to dose  Acute on chronic diastolic CHF -Continue with IV diuresis, continue with Lasix 60 mg IV twice daily, daily weight, strict ins and out  CAD -Continue with home dose aspirin, will resume metoprolol when stable  Diabetes mellitus type 2 -CBGs are controlled, will hold home dose Lantus, continue with insulin sliding scale  COVID-19 Labs  Recent  Labs    10/12/18 0011  DDIMER 0.48  FERRITIN 65  LDH 284*  CRP <0.8         Code Status : Full  Family Communication  : will call family  Disposition Plan  : remains in ICU.  Consults  :  PCCM  Procedures  : intubated 10/12/2018  DVT Prophylaxis  :  On warfarin  Lab Results  Component Value Date   PLT 164 10/12/2018    Antibiotics  :   Anti-infectives (From admission, onward)   None        Objective:   Vitals:   10/12/18 0900 10/12/18 1028 10/12/18 1046 10/12/18 1132  BP:    106/65  Pulse: 81 84  81  Resp: (!) 21 (!) 24  (!) 23  Temp:   99.7 F (37.6 C)   TempSrc:   Axillary   SpO2: 97% 94%  96%  Weight:      Height:        Wt Readings from Last 3 Encounters:  10/12/18 (!) 156.9 kg  10/07/18 (!) 150.6 kg  04/14/18 (!) 181.4 kg     Intake/Output Summary (Last 24 hours) at 10/12/2018 1205 Last data filed at 10/12/2018 1000 Gross per 24 hour  Intake 59.19 ml  Output 950 ml  Net -890.81 ml     Physical Exam  Morbidly obese female, laying in bed, comfortable, no apparent distress Vented breathing sounds, no wheezing, bibasilar crackles RRR,No Gallops,Rubs or new Murmurs, No Parasternal Heave +ve B.Sounds, Abd Soft,  No rebound - guarding or rigidity. No Cyanosis, Clubbing , has pitting edema, No new Rash or bruise     Data Review:    CBC Recent Labs  Lab 10/07/18 1642  10/08/18 0636 10/09/18 0803 10/12/18 0011 10/12/18 0433 10/12/18 0551 10/12/18 1124  WBC 8.6  --  6.8 5.4 7.1 5.5  --   --   HGB 12.0   < >  10.9* 10.8* 11.5* 11.4* 12.2 10.9*  HCT 41.8   < > 38.9 37.0 41.7 40.9 36.0 32.0*  PLT 184  --  149* 159 171 164  --   --   MCV 101.5*  --  99.7 97.1 102.5* 101.2*  --   --   MCH 29.1  --  27.9 28.3 28.3 28.2  --   --   MCHC 28.7*  --  28.0* 29.2* 27.6* 27.9*  --   --   RDW 14.5  --  14.6 14.6 14.6 14.6  --   --   LYMPHSABS 1.1  --  1.0  --  0.6*  --   --   --   MONOABS 0.5  --  0.5  --  0.6  --   --   --   EOSABS 0.2  --  0.2  --  0.1  --   --   --   BASOSABS 0.0  --  0.0  --  0.0  --   --   --    < > = values in this interval not displayed.    Chemistries  Recent Labs  Lab 10/07/18 1642  10/08/18 0636 10/09/18 0803 10/12/18 0011 10/12/18 0551 10/12/18 1124  NA 144   < > 142 141 141 139 139  K 4.5   < > 4.3 3.8 4.6 4.1 3.7  CL 101  --  104 99 98  --   --   CO2 35*  --  30 37* 34*  --   --  GLUCOSE 140*  --  124* 100* 129*  --   --   BUN 28*  --  27* 24* 25*  --   --   CREATININE 1.18*  --  1.08* 0.99 1.27*  --   --   CALCIUM 9.4  --  8.9 8.8* 9.1  --   --   MG 1.9  --   --   --   --   --   --   AST 20  --  18  --  39  --   --   ALT 20  --  17  --  33  --   --   ALKPHOS 141*  --  119  --  117  --   --   BILITOT 0.4  --  0.6  --  0.6  --   --    < > = values in this interval not displayed.   ------------------------------------------------------------------------------------------------------------------ Recent Labs    10/12/18 0011  TRIG 86    Lab Results  Component Value Date   HGBA1C 11.9 (H) 08/25/2015   ------------------------------------------------------------------------------------------------------------------ No results for input(s): TSH, T4TOTAL, T3FREE, THYROIDAB in the last 72 hours.  Invalid input(s): FREET3 ------------------------------------------------------------------------------------------------------------------ Recent Labs    10/12/18 0011  FERRITIN 65    Coagulation profile Recent Labs  Lab 10/07/18 1642 10/08/18 0636  10/09/18 0502 10/12/18 0544  INR 2.3* 2.4* 2.7* 2.2*    Recent Labs    10/12/18 0011  DDIMER 0.48    Cardiac Enzymes Recent Labs  Lab 10/08/18 0001 10/12/18 0011  TROPONINI <0.03 <0.03   ------------------------------------------------------------------------------------------------------------------    Component Value Date/Time   BNP 201.2 (H) 10/12/2018 0011    Inpatient Medications  Scheduled Meds:  aspirin  81 mg Per Tube Daily   chlorhexidine gluconate (MEDLINE KIT)  15 mL Mouth Rinse BID   enoxaparin (LOVENOX) injection  40 mg Subcutaneous Q12H   feeding supplement (PRO-STAT SUGAR FREE 64)  30 mL Per Tube BID   feeding supplement (VITAL HIGH PROTEIN)  1,000 mL Per Tube Q24H   fentaNYL       furosemide  60 mg Intravenous Once   insulin aspart  0-15 Units Subcutaneous Q4H   mouth rinse  15 mL Mouth Rinse 10 times per day   midazolam       pantoprazole (PROTONIX) IV  40 mg Intravenous QHS   Continuous Infusions:  fentaNYL infusion INTRAVENOUS 100 mcg/hr (10/12/18 1000)   propofol Stopped (10/12/18 0602)   PRN Meds:.bisacodyl, docusate, fentaNYL, Ipratropium-Albuterol, midazolam, midazolam  Micro Results Recent Results (from the past 240 hour(s))  SARS Coronavirus 2 [facility] order, Performed in [place] hospital lab)     Status: None   Collection Time: 10/07/18  6:45 PM  Result Value Ref Range Status   SARS Coronavirus 2 NEGATIVE NEGATIVE Final    Comment: (NOTE) If result is NEGATIVE SARS-CoV-2 target nucleic acids are NOT DETECTED. The SARS-CoV-2 RNA is generally detectable in upper and lower  respiratory specimens during the acute phase of infection. The lowest  concentration of SARS-CoV-2 viral copies this assay can detect is 250  copies / mL. A negative result does not preclude SARS-CoV-2 infection  and should not be used as the sole basis for treatment or other  patient management decisions.  A negative result may occur with   improper specimen collection / handling, submission of specimen other  than nasopharyngeal swab, presence of viral mutation(s) within the  areas targeted by this assay, and inadequate number  of viral copies  (<250 copies / mL). A negative result must be combined with clinical  observations, patient history, and epidemiological information. If result is POSITIVE SARS-CoV-2 target nucleic acids are DETECTED. The SARS-CoV-2 RNA is generally detectable in upper and lower  respiratory specimens dur ing the acute phase of infection.  Positive  results are indicative of active infection with SARS-CoV-2.  Clinical  correlation with patient history and other diagnostic information is  necessary to determine patient infection status.  Positive results do  not rule out bacterial infection or co-infection with other viruses. If result is PRESUMPTIVE POSTIVE SARS-CoV-2 nucleic acids MAY BE PRESENT.   A presumptive positive result was obtained on the submitted specimen  and confirmed on repeat testing.  While 2019 novel coronavirus  (SARS-CoV-2) nucleic acids may be present in the submitted sample  additional confirmatory testing may be necessary for epidemiological  and / or clinical management purposes  to differentiate between  SARS-CoV-2 and other Sarbecovirus currently known to infect humans.  If clinically indicated additional testing with an alternate test  methodology [phone]) is advised. The SARS-CoV-2 RNA is generally  detectable in upper and lower respiratory sp ecimens during the acute  phase of infection. The expected result is Negative. Fact Sheet for Patients:  StrictlyIdeas.no Fact Sheet for Healthcare Providers: BankingDealers.co.za This test is not yet approved or cleared by the [place] FDA and has been authorized for detection and/or diagnosis of SARS-CoV-2 by FDA under an Emergency Use Authorization (EUA).  This EUA will  remain in effect (meaning this test can be used) for the duration of the COVID-19 declaration under Section 564(b)(1) of the Act, 21 U.S.C. section 360bbb-3(b)(1), unless the authorization is terminated or revoked sooner. Performed at [facility], [place] [address] [place], [place] [address]   Novel Coronavirus, NAA (hospital order; send-out to ref lab)     Status: None   Collection Time: 10/07/18  6:45 PM  Result Value Ref Range Status   SARS-CoV-2, NAA NOT DETECTED NOT DETECTED Final    Comment: (NOTE) This test was developed and its performance characteristics determined by [employer]. This test has not been FDA cleared or approved. This test has been authorized by FDA under an Emergency Use Authorization (EUA). This test has been validated in accordance with the FDA's Guidance Document (Policy for [place] in Laboratories Certified to Perform High Complexity Testing under CLIA prior to Emergency Use Authorization for Coronavirus [id number] during the [facility] Emergency) issued on February 29th, 2020. FDA independent review of this validation is pending. This test is only authorized for the duration of time the declaration that circumstances exist justifying the authorization of the emergency use of in vitro diagnostic tests for detection of SARS-CoV- 2 virus and/or diagnosis of COVID-19 infection under section 564(b)(1) of the Act, 21 U.S.C. 621HYQ-6(V)(7), unless the authorization is terminated or revoked sooner. Performed At: [facility] [address] [place], [place] [id number] Rush Farmer MD WU:[id number]    [place]  Final    Comment: Performed at [facility], [place] [address], [place], [place] [address]  Respiratory Panel by PCR     Status: None   Collection Time: 10/07/18  6:45 PM  Result Value Ref Range Status   Adenovirus NOT DETECTED NOT DETECTED Final   Coronavirus 229E NOT DETECTED NOT  DETECTED Final    Comment: (NOTE) The Coronavirus on the Respiratory Panel, DOES NOT test for the novel  Coronavirus (2019 nCoV)  Coronavirus HKU1 NOT DETECTED NOT DETECTED Final   Coronavirus NL63 NOT DETECTED NOT DETECTED Final   Coronavirus OC43 NOT DETECTED NOT DETECTED Final   Metapneumovirus NOT DETECTED NOT DETECTED Final   Rhinovirus / Enterovirus NOT DETECTED NOT DETECTED Final   Influenza A NOT DETECTED NOT DETECTED Final   Influenza B NOT DETECTED NOT DETECTED Final   Parainfluenza Virus 1 NOT DETECTED NOT DETECTED Final   Parainfluenza Virus 2 NOT DETECTED NOT DETECTED Final   Parainfluenza Virus 3 NOT DETECTED NOT DETECTED Final   Parainfluenza Virus 4 NOT DETECTED NOT DETECTED Final   Respiratory Syncytial Virus NOT DETECTED NOT DETECTED Final   Bordetella pertussis NOT DETECTED NOT DETECTED Final   Chlamydophila pneumoniae NOT DETECTED NOT DETECTED Final   Mycoplasma pneumoniae NOT DETECTED NOT DETECTED Final    Comment: Performed at [facility], [place] [address] [place], [place] [address]  Culture, blood (routine x 2) Call MD if unable to obtain prior to antibiotics being given     Status: Abnormal   Collection Time: 10/07/18 11:45 PM  Result Value Ref Range Status   Specimen Description BLOOD RIGHT ARM  Final   Special Requests   Final    BOTTLES DRAWN AEROBIC AND ANAEROBIC Blood Culture results may not be optimal due to an excessive volume of blood received in culture bottles   Culture  Setup Time   Final    GRAM POSITIVE COCCI AEROBIC BOTTLE ONLY CRITICAL RESULT CALLED TO, READ BACK BY AND VERIFIED WITH: PHRMD J ORIET '@0354'  10/09/18 BY S GEZAHEGN    Culture (A)  Final    STAPHYLOCOCCUS SPECIES (COAGULASE NEGATIVE) THE SIGNIFICANCE OF ISOLATING THIS ORGANISM FROM A SINGLE VENIPUNCTURE CANNOT BE PREDICTED WITHOUT FURTHER CLINICAL AND CULTURE CORRELATION. SUSCEPTIBILITIES AVAILABLE ONLY ON REQUEST. Performed at [facility], [place] [address],  [place], [place] [address]    Report Status 11/01/2018 FINAL  Final  Blood Culture ID Panel (Reflexed)     Status: Abnormal   Collection Time: 10/07/18 11:45 PM  Result Value Ref Range Status   Enterococcus species NOT DETECTED NOT DETECTED Final   Listeria monocytogenes NOT DETECTED NOT DETECTED Final   Staphylococcus species DETECTED (A) NOT DETECTED Final    Comment: Methicillin (oxacillin) resistant coagulase negative staphylococcus. Possible blood culture contaminant (unless isolated from more than one blood culture draw or clinical case suggests pathogenicity). No antibiotic treatment is indicated for blood  culture contaminants. CRITICAL RESULT CALLED TO, READ BACK BY AND VERIFIED WITH: PHRMD J ORIET '@0354'  10/09/18 BY S GEZAHEGN    Staphylococcus aureus (BCID) NOT DETECTED NOT DETECTED Final   Methicillin resistance DETECTED (A) NOT DETECTED Final    Comment: CRITICAL RESULT CALLED TO, READ BACK BY AND VERIFIED WITH: PHRMD J ORIET '@0354'  10/09/18 BY S GEZAHEGN    Streptococcus species NOT DETECTED NOT DETECTED Final   Streptococcus agalactiae NOT DETECTED NOT DETECTED Final   Streptococcus pneumoniae NOT DETECTED NOT DETECTED Final   Streptococcus pyogenes NOT DETECTED NOT DETECTED Final   Acinetobacter baumannii NOT DETECTED NOT DETECTED Final   Enterobacteriaceae species NOT DETECTED NOT DETECTED Final   Enterobacter cloacae complex NOT DETECTED NOT DETECTED Final   Escherichia coli NOT DETECTED NOT DETECTED Final   Klebsiella oxytoca NOT DETECTED NOT DETECTED Final   Klebsiella pneumoniae NOT DETECTED NOT DETECTED Final   Proteus species NOT DETECTED NOT DETECTED Final   Serratia marcescens NOT DETECTED NOT DETECTED Final   Haemophilus influenzae NOT DETECTED NOT DETECTED Final  Neisseria meningitidis NOT DETECTED NOT DETECTED Final   Pseudomonas aeruginosa NOT DETECTED NOT DETECTED Final   Candida albicans NOT DETECTED NOT DETECTED Final   Candida glabrata NOT DETECTED NOT  DETECTED Final   Candida krusei NOT DETECTED NOT DETECTED Final   Candida parapsilosis NOT DETECTED NOT DETECTED Final   Candida tropicalis NOT DETECTED NOT DETECTED Final    Comment: Performed at [facility], [place] [address] [place], [place] [address]  Culture, blood (routine x 2) Call MD if unable to obtain prior to antibiotics being given     Status: None (Preliminary result)   Collection Time: 10/08/18 12:08 AM  Result Value Ref Range Status   Specimen Description BLOOD RIGHT HAND  Final   Special Requests   Final    BOTTLES DRAWN AEROBIC ONLY Blood Culture adequate volume   Culture   Final    NO GROWTH 4 DAYS Performed at [facility], [place] [address] [place], [place] [address]    Report Status PENDING  Incomplete  MRSA PCR Screening     Status: None   Collection Time: 10/08/18  1:06 AM  Result Value Ref Range Status   MRSA by PCR NEGATIVE NEGATIVE Final    Comment:        The GeneXpert MRSA Assay (FDA approved for NASAL specimens only), is one component of a comprehensive MRSA colonization surveillance program. It is not intended to diagnose MRSA infection nor to guide or monitor treatment for MRSA infections. Performed at [facility], [place] [address], [place], [place] [address]   Urine culture     Status: Abnormal   Collection Time: 10/08/18  1:34 PM  Result Value Ref Range Status   Specimen Description URINE, CLEAN CATCH  Final   Special Requests   Final    Immunocompromised Performed at [facility], [place] [address], [place], [place] [address]    Culture MULTIPLE SPECIES PRESENT, SUGGEST RECOLLECTION (A)  Final   Report Status 10/09/2018 FINAL  Final  SARS Coronavirus 2 [facility] order, Performed in [place] hospital lab)     Status: Abnormal   Collection Time: 10/12/18 12:11 AM  Result Value Ref Range Status   SARS Coronavirus 2 POSITIVE (A) NEGATIVE Final    Comment: RESULT CALLED TO, READ BACK BY AND VERIFIED WITH: K MUNNET '@0159'   10/12/18 BY S GEZAHEGN (NOTE) If result is NEGATIVE SARS-CoV-2 target nucleic acids are NOT DETECTED. The SARS-CoV-2 RNA is generally detectable in upper and lower  respiratory specimens during the acute phase of infection. The lowest  concentration of SARS-CoV-2 viral copies this assay can detect is 250  copies / mL. A negative result does not preclude SARS-CoV-2 infection  and should not be used as the sole basis for treatment or other  patient management decisions.  A negative result may occur with  improper specimen collection / handling, submission of specimen other  than nasopharyngeal swab, presence of viral mutation(s) within the  areas targeted by this assay, and inadequate number of viral copies  (<250 copies / mL). A negative result must be combined with clinical  observations, patient history, and epidemiological information. If result is POSITIVE SARS-CoV-2 target nucleic acids are DETECTED. T he SARS-CoV-2 RNA is generally detectable in upper and lower  respiratory specimens during the acute phase of infection.  Positive  results are indicative of active infection with SARS-CoV-2.  Clinical  correlation with patient history and other diagnostic information is  necessary to determine  patient infection status.  Positive results do  not rule out bacterial infection or co-infection with other viruses. If result is PRESUMPTIVE POSTIVE SARS-CoV-2 nucleic acids MAY BE PRESENT.   A presumptive positive result was obtained on the submitted specimen  and confirmed on repeat testing.  While 2019 novel coronavirus  (SARS-CoV-2) nucleic acids may be present in the submitted sample  additional confirmatory testing may be necessary for epidemiological  and / or clinical management purposes  to differentiate between  SARS-CoV-2 and other Sarbecovirus currently known to infect humans.  If clinically indicated additional testing with an alternate test  methodology [phone]) is  advised.  The SARS-CoV-2 RNA is generally  detectable in upper and lower respiratory specimens during the acute  phase of infection. The expected result is Negative. Fact Sheet for Patients:  StrictlyIdeas.no Fact Sheet for Healthcare Providers: BankingDealers.co.za This test is not yet approved or cleared by the [place] FDA and has been authorized for detection and/or diagnosis of SARS-CoV-2 by FDA under an Emergency Use Authorization (EUA).  This EUA will remain in effect (meaning this test can be used) for the duration of the COVID-19 declaration under Section 564(b)(1) of the Act, 21 U.S.C. section 360bbb-3(b)(1), unless the authorization is terminated or revoked sooner. Performed at [facility], [place] [address], [place], [place] [address]   Blood Culture (routine x 2)     Status: None (Preliminary result)   Collection Time: 10/12/18 12:11 AM  Result Value Ref Range Status   Specimen Description BLOOD RIGHT HAND  Final   Special Requests   Final    BOTTLES DRAWN AEROBIC AND ANAEROBIC Blood Culture results may not be optimal due to an inadequate volume of blood received in culture bottles   Culture   Final    NO GROWTH < 12 HOURS Performed at [facility], [place] [address] [place], [place] [address]    Report Status PENDING  Incomplete  Blood Culture (routine x 2)     Status: None (Preliminary result)   Collection Time: 10/12/18 12:32 AM  Result Value Ref Range Status   Specimen Description BLOOD SITE NOT SPECIFIED  Final   Special Requests   Final    BOTTLES DRAWN AEROBIC AND ANAEROBIC Blood Culture results may not be optimal due to an inadequate volume of blood received in culture bottles   Culture   Final    NO GROWTH < 12 HOURS Performed at [facility], [address] [place], [place] [address]    Report Status PENDING  Incomplete  Culture, respiratory (non-expectorated)     Status: None (Preliminary result)    Collection Time: 10/12/18  8:15 AM  Result Value Ref Range Status   Specimen Description TRACHEAL ASPIRATE  Final   Special Requests NONE  Final   Gram Stain   Final    RARE WBC PRESENT, PREDOMINANTLY PMN NO ORGANISMS SEEN Performed at [facility], [place] [address], [place], [place] [address]    Culture PENDING  Incomplete   Report Status PENDING  Incomplete    Radiology Reports Dg Abd 1 View  Result Date: 10/12/2018 CLINICAL DATA:  Enteric tube placement. EXAM: ABDOMEN - 1 VIEW COMPARISON:  CT abdomen pelvis dated May 08, 2014. FINDINGS: Enteric tube tip in the stomach. The visualized bowel gas pattern is normal. No radio-opaque calculi or other significant radiographic abnormality are seen. No acute osseous abnormality. IMPRESSION: 1. Enteric tube in the stomach. Electronically Signed   By: Gwyndolyn Saxon  Marzella Schlein M.D.   On: 10/12/2018 06:24   Ct Head Wo Contrast  Result Date: 10/07/2018 CLINICAL DATA:  Seizure.  Bilateral hand twitching EXAM: CT HEAD WITHOUT CONTRAST TECHNIQUE: Contiguous axial images were obtained from the base of the skull through the vertex without intravenous contrast. COMPARISON:  None. FINDINGS: Brain: Ventricle size and cerebral volume normal. Negative for acute infarct. Negative for hemorrhage or mass. Vascular: Negative for hyperdense vessel Skull: Negative Sinuses/Orbits: Bilateral exophthalmos. No orbital mass. Paranasal sinuses clear. Other: None IMPRESSION: No acute intracranial abnormality Exophthalmos. Electronically Signed   By: Franchot Gallo M.D.   On: 10/07/2018 17:10   Dg Chest Port 1 View  Result Date: 10/12/2018 CLINICAL DATA:  Endotracheal tube placement. EXAM: PORTABLE CHEST 1 VIEW COMPARISON:  Chest x-ray from same day at 3:36 a.m. FINDINGS: Endotracheal tube in place with the tip now 8 mm above the carina. Enteric tube in the stomach. Stable cardiomegaly. Persistent low lung volumes with moderate pulmonary edema and small bilateral pleural  effusions. Slightly improved aeration at the right lung base. Persistent dense retrocardiac opacification, likely atelectasis. No pneumothorax. No acute osseous abnormality. IMPRESSION: 1. Endotracheal tube tip now 8 mm above the carina. Consider retracting an additional 2 cm. 2. Unchanged congestive heart failure. Slightly improved aeration at the right lung base. Electronically Signed   By: Titus Dubin M.D.   On: 10/12/2018 06:28   Dg Chest Port 1 View  Result Date: 10/12/2018 CLINICAL DATA:  Follow-up examination status post endotracheal tube repositioning. EXAM: PORTABLE CHEST 1 VIEW COMPARISON:  Prior radiograph earlier same day. FINDINGS: Tip of the endotracheal tube still remains deep within the right mainstem bronchus. Retraction by approximately 3.5 cm recommended. Remainder of the examination remains unchanged. IMPRESSION: Tip of endotracheal tube remains deep within the right mainstem bronchus. Retraction by approximately 3-3.5 cm recommended. Critical Value/emergent results were called by telephone at the time of interpretation on 10/12/2018 at 4:51 am to Dr. Delora Fuel , who verbally acknowledged these results. Electronically Signed   By: Jeannine Boga M.D.   On: 10/12/2018 04:51   Dg Chest Port 1 View  Result Date: 10/12/2018 CLINICAL DATA:  Initial evaluation for intubation. EXAM: PORTABLE CHEST 1 VIEW COMPARISON:  Prior radiograph from earlier the same day. FINDINGS: Endotracheal tube in place, position deep with tip in the right mainstem bronchus. At time of this dictation, a second film has been obtained. Tip still appears to be within the right mainstem bronchus. Enteric tube courses into the abdomen. Stable cardiomegaly. Low lung volumes. Pulmonary edema with bilateral pleural effusions again seen, similar to previous. Probable superimposed bibasilar atelectasis. No pneumothorax. Osseous structures unchanged. IMPRESSION: 1. Tip of endotracheal tube positioned within the right  mainstem bronchus. At time of this dictation, a second filled with a repositioned tube has already been acquired. 2. Diffuse pulmonary edema and small bilateral pleural effusions, suggesting CHF. 3. Probable superimposed bibasilar atelectasis. 4. Electronically Signed   By: Jeannine Boga M.D.   On: 10/12/2018 04:47   Dg Chest Port 1 View  Result Date: 10/12/2018 CLINICAL DATA:  Altered mental status, shortness of breath EXAM: PORTABLE CHEST 1 VIEW COMPARISON:  10/07/2018 FINDINGS: Cardiomegaly. Diffuse bilateral airspace disease, favor edema/CHF. Suspect layering effusions. Very low lung volumes. No acute bony abnormality. IMPRESSION: Cardiomegaly with severe diffuse bilateral airspace disease and suspected layering effusions, likely edema/CHF. Low lung volumes. Electronically Signed   By: Rolm Baptise M.D.   On: 10/12/2018 01:01   Dg Chest Portable 1 View  Result Date: 10/07/2018 CLINICAL DATA:  Tremor.  Cough. EXAM: PORTABLE CHEST 1 VIEW COMPARISON:  04/14/2018 and 05/17/2008 FINDINGS: There is abnormal increased density at the left lung base, new since the prior study. Heart size and pulmonary vascularity are normal. There is a new small focal area of linear atelectasis in the right upper lobe. No effusions. IMPRESSION: Area of new infiltrate at the left lung base. Minimal atelectasis in the right upper lobe. Electronically Signed   By: Lorriane Shire M.D.   On: 10/07/2018 17:14    Phillips Climes M.D on 10/12/2018 at 12:05 PM  Between 7am to 7pm - Pager - [phone]  After 7pm go to [email or web address] - password [facility]  [facility] -  Office  [phone]

## 2018-10-13 ENCOUNTER — Inpatient Hospital Stay (HOSPITAL_COMMUNITY): Payer: Medicare Other

## 2018-10-13 ENCOUNTER — Other Ambulatory Visit: Payer: Self-pay

## 2018-10-13 DIAGNOSIS — U071 COVID-19: Principal | ICD-10-CM

## 2018-10-13 DIAGNOSIS — Z978 Presence of other specified devices: Secondary | ICD-10-CM

## 2018-10-13 DIAGNOSIS — I5033 Acute on chronic diastolic (congestive) heart failure: Secondary | ICD-10-CM

## 2018-10-13 LAB — POCT I-STAT 7, (LYTES, BLD GAS, ICA,H+H)
Acid-Base Excess: 12 mmol/L — ABNORMAL HIGH (ref 0.0–2.0)
Bicarbonate: 36.6 mmol/L — ABNORMAL HIGH (ref 20.0–28.0)
Calcium, Ion: 1.19 mmol/L (ref 1.15–1.40)
HCT: 32 % — ABNORMAL LOW (ref 36.0–46.0)
Hemoglobin: 10.9 g/dL — ABNORMAL LOW (ref 12.0–15.0)
O2 Saturation: 97 %
Patient temperature: 98.6
Potassium: 2.7 mmol/L — CL (ref 3.5–5.1)
Sodium: 141 mmol/L (ref 135–145)
TCO2: 38 mmol/L — ABNORMAL HIGH (ref 22–32)
pCO2 arterial: 44.4 mmHg (ref 32.0–48.0)
pH, Arterial: 7.524 — ABNORMAL HIGH (ref 7.350–7.450)
pO2, Arterial: 82 mmHg — ABNORMAL LOW (ref 83.0–108.0)

## 2018-10-13 LAB — MAGNESIUM
Magnesium: 2 mg/dL (ref 1.7–2.4)
Magnesium: 2.1 mg/dL (ref 1.7–2.4)

## 2018-10-13 LAB — BASIC METABOLIC PANEL
Anion gap: 8 (ref 5–15)
Anion gap: 10 (ref 5–15)
BUN: 44 mg/dL — ABNORMAL HIGH (ref 6–20)
BUN: 48 mg/dL — ABNORMAL HIGH (ref 6–20)
CO2: 34 mmol/L — ABNORMAL HIGH (ref 22–32)
CO2: 34 mmol/L — ABNORMAL HIGH (ref 22–32)
Calcium: 8.8 mg/dL — ABNORMAL LOW (ref 8.9–10.3)
Calcium: 8.8 mg/dL — ABNORMAL LOW (ref 8.9–10.3)
Chloride: 100 mmol/L (ref 98–111)
Chloride: 100 mmol/L (ref 98–111)
Creatinine, Ser: 1.12 mg/dL — ABNORMAL HIGH (ref 0.44–1.00)
Creatinine, Ser: 1.1 mg/dL — ABNORMAL HIGH (ref 0.44–1.00)
GFR calc Af Amer: >60 mL/min (ref >60)
GFR calc Af Amer: >60 mL/min (ref >60)
GFR calc non Af Amer: 53 mL/min — ABNORMAL LOW (ref >60)
GFR calc non Af Amer: 55 mL/min — ABNORMAL LOW (ref >60)
Glucose, Bld: 112 mg/dL — ABNORMAL HIGH (ref 70–99)
Glucose, Bld: 135 mg/dL — ABNORMAL HIGH (ref 70–99)
Potassium: 2.7 mmol/L — CL (ref 3.5–5.1)
Potassium: 3.9 mmol/L (ref 3.5–5.1)
Sodium: 142 mmol/L (ref 135–145)
Sodium: 144 mmol/L (ref 135–145)

## 2018-10-13 LAB — CBC
HCT: 36.4 % (ref 36.0–46.0)
Hemoglobin: 10.7 g/dL — ABNORMAL LOW (ref 12.0–15.0)
MCH: 28.1 pg (ref 26.0–34.0)
MCHC: 29.4 g/dL — ABNORMAL LOW (ref 30.0–36.0)
MCV: 95.5 fL (ref 80.0–100.0)
Platelets: 166 10*3/uL (ref 150–400)
RBC: 3.81 MIL/uL — ABNORMAL LOW (ref 3.87–5.11)
RDW: 14.7 % (ref 11.5–15.5)
WBC: 5.6 10*3/uL (ref 4.0–10.5)
nRBC: 2.1 % — ABNORMAL HIGH (ref 0.0–0.2)

## 2018-10-13 LAB — PHOSPHORUS: Phosphorus: 2.1 mg/dL — ABNORMAL LOW (ref 2.5–4.6)

## 2018-10-13 LAB — GLUCOSE, CAPILLARY
Glucose-Capillary: 105 mg/dL — ABNORMAL HIGH (ref 70–99)
Glucose-Capillary: 106 mg/dL — ABNORMAL HIGH (ref 70–99)
Glucose-Capillary: 113 mg/dL — ABNORMAL HIGH (ref 70–99)
Glucose-Capillary: 121 mg/dL — ABNORMAL HIGH (ref 70–99)
Glucose-Capillary: 127 mg/dL — ABNORMAL HIGH (ref 70–99)
Glucose-Capillary: 135 mg/dL — ABNORMAL HIGH (ref 70–99)

## 2018-10-13 LAB — C-REACTIVE PROTEIN: CRP: <0.8 mg/dL (ref <1.0)

## 2018-10-13 LAB — PROTIME-INR
INR: 1.8 — ABNORMAL HIGH (ref 0.8–1.2)
Prothrombin Time: 20.7 seconds — ABNORMAL HIGH (ref 11.4–15.2)

## 2018-10-13 LAB — FERRITIN: Ferritin: 87 ng/mL (ref 11–307)

## 2018-10-13 LAB — ECHOCARDIOGRAM LIMITED
Height: 64 in
Weight: 5294.4 oz

## 2018-10-13 LAB — CULTURE, BLOOD (ROUTINE X 2)
Culture: NO GROWTH
Special Requests: ADEQUATE

## 2018-10-13 MED ORDER — DILTIAZEM 12 MG/ML ORAL SUSPENSION
30.0000 mg | Freq: Three times a day (TID) | ORAL | Status: DC
  Administered 2018-10-13 – 2018-10-17 (×12): 30 mg
  Filled 2018-10-13 (×15): qty 3

## 2018-10-13 MED ORDER — METOPROLOL TARTRATE 25 MG/10 ML ORAL SUSPENSION
25.0000 mg | Freq: Two times a day (BID) | ORAL | Status: DC
  Administered 2018-10-13 – 2018-10-16 (×5): 25 mg
  Filled 2018-10-13 (×9): qty 10

## 2018-10-13 MED ORDER — K PHOS MONO-SOD PHOS DI & MONO 155-852-130 MG PO TABS: 500.0000 mg | ORAL_TABLET | Freq: Four times a day (QID) | ORAL | Status: DC

## 2018-10-13 MED ORDER — POTASSIUM CHLORIDE 20 MEQ/15ML (10%) PO SOLN
40.0000 meq | ORAL | Status: DC
  Administered 2018-10-13: 40 meq via ORAL
  Filled 2018-10-13: qty 30

## 2018-10-13 MED ORDER — K PHOS MONO-SOD PHOS DI & MONO 155-852-130 MG PO TABS
500.0000 mg | ORAL_TABLET | Freq: Four times a day (QID) | ORAL | Status: DC
  Filled 2018-10-13 (×2): qty 2

## 2018-10-13 MED ORDER — POTASSIUM CHLORIDE 20 MEQ/15ML (10%) PO SOLN
40.0000 meq | ORAL | Status: AC
  Administered 2018-10-13 (×3): 40 meq via ORAL
  Filled 2018-10-13 (×2): qty 30

## 2018-10-13 MED ORDER — K PHOS MONO-SOD PHOS DI & MONO 155-852-130 MG PO TABS
500.0000 mg | ORAL_TABLET | Freq: Four times a day (QID) | ORAL | Status: AC
  Administered 2018-10-13 – 2018-10-14 (×5): 500 mg
  Filled 2018-10-13 (×6): qty 2

## 2018-10-13 MED ORDER — WARFARIN SODIUM 6 MG PO TABS
6.0000 mg | ORAL_TABLET | Freq: Once | ORAL | Status: AC
  Administered 2018-10-13: 18:00:00 6 mg via ORAL
  Filled 2018-10-13: qty 1

## 2018-10-13 MED ORDER — POLYVINYL ALCOHOL 1.4 % OP SOLN
2.0000 [drp] | Freq: Four times a day (QID) | OPHTHALMIC | Status: DC | PRN
  Filled 2018-10-13: qty 15

## 2018-10-13 MED ORDER — HYPROMELLOSE (GONIOSCOPIC) 2.5 % OP SOLN
2.0000 [drp] | Freq: Four times a day (QID) | OPHTHALMIC | Status: DC | PRN
  Filled 2018-10-13: qty 15

## 2018-10-13 MED ORDER — MAGNESIUM SULFATE IN D5W 1-5 GM/100ML-% IV SOLN
1.0000 g | Freq: Once | INTRAVENOUS | Status: AC
  Administered 2018-10-13: 08:00:00 1 g via INTRAVENOUS
  Filled 2018-10-13: qty 100

## 2018-10-13 MED ORDER — WARFARIN SODIUM 6 MG PO TABS
6.0000 mg | ORAL_TABLET | Freq: Once | ORAL | Status: DC
  Filled 2018-10-13: qty 1

## 2018-10-13 MED ORDER — ACETAZOLAMIDE 250 MG PO TABS
250.0000 mg | ORAL_TABLET | Freq: Two times a day (BID) | ORAL | Status: DC
  Administered 2018-10-13 – 2018-10-16 (×6): 250 mg
  Filled 2018-10-13 (×6): qty 1

## 2018-10-13 MED ORDER — METOPROLOL TARTRATE 25 MG PO TABS: 25.0000 mg | ORAL_TABLET | Freq: Two times a day (BID) | ORAL | Status: DC

## 2018-10-13 NOTE — Progress Notes (Signed)
2D limited echo completed. 

## 2018-10-13 NOTE — Progress Notes (Signed)
   10/13/18 1300  Clinical Encounter Type  Visited With Family;Other (Comment) (Sister Annice Pih)  Visit Type Initial;Psychological support;Spiritual support  Spiritual Encounters  Spiritual Needs Emotional;Other (Comment) (Spiritual Care Conversation/Support)  Stress Factors  Family Stress Factors Health changes;Major life changes   I spoke with the patient's sister, Annice Pih. I provided emotional and spiritual support for her. Annice Pih states that she has good support from friends and family, and that her faith is getting her through. She requested prayer for her and for the patient, Emily Fry. Annice Pih said that they came out of the [employer], but ascribe to the [employer] denomination. Faith is an important part of the patient's background.  The patient's sister was glad to know that they have that support and that the patient is not alone here in the hospital.    Please, contact Spiritual Care for further assistance.   Chaplain Clint Bolder M.Div., [facility]

## 2018-10-13 NOTE — Progress Notes (Signed)
CRITICAL LAB VALUE: K+ 2.7 Hospitalist on call paged, immediately returned page, informed of K level, stated he was aware, K PO via NG ordered and administered

## 2018-10-13 NOTE — Progress Notes (Signed)
NAME:  Emily Fry, MRN:  [id number], DOB:  07-24-1955, LOS: 1 ADMISSION DATE:  10/24/2018, CONSULTATION DATE:  10/12/2018 REFERRING MD:  Dr. Preston FleetingGlick, CHIEF COMPLAINT:  SOB/ hypoxic  Brief History   63 year old female from [facility] nursing [facility] presenting with increasing SOB day and hypoxia since yesterday.  Severe bilateral airspace disease. COVID positive. Intubated.  During intubation, a tooth was dislodged with temporary bleeding in which has since abated.  PCCM called for admit.   Recent hospitalization 4/28- 4/30 acute on chronic hypercarbic/ hypoxic respiratory failure with two negative COVID test.   Past Medical History  COPD, OSA, chronic hypoxemic/ hypercapneic respiratory failure on baseline 2L , A-fib, Allergic rhinitis, Bilateral primary osteoarthritis of knee, Diastolic heart failure, GERD (gastroesophageal reflux disease), Gout, Hypertension, morbid obesity  Significant Hospital Events   5/2 Admitted   Consults:   Procedures:  5/3 ETT >>  Significant Diagnostic Tests:  5/2 CXR >> Cardiomegaly with severe diffuse bilateral airspace disease and suspected layering effusions, likely edema/CHF. Low lung volumes.  Micro Data:  5/3 BCx 2 >> 5/3 COVID 19 >> POSITIVE 5/3 trach asp >>  Antimicrobials:   Interim history/subjective:  Weaning on pressure support today morning.  Awake interactive Diuresing well  Objective   Blood pressure 113/82, pulse 96, temperature 98.4 F (36.9 C), temperature source Oral, resp. rate (!) 24, height 5\' 4"  (1.626 m), weight (!) 150.1 kg, SpO2 97 %.    Vent Mode: PRVC FiO2 (%):  [50 %-80 %] 50 % Set Rate:  [14 bmp-24 bmp] 24 bmp Vt Set:  [330 mL-500 mL] 330 mL PEEP:  [8 cmH20-10 cmH20] 10 cmH20 Plateau Pressure:  [25 cmH20-30 cmH20] 27 cmH20   Intake/Output Summary (Last 24 hours) at 10/13/2018 0758 Last data filed at 10/13/2018 0600 Gross per 24 hour  Intake 702.18 ml  Output 2235 ml  Net -1532.82 ml   Filed Weights   10/12/18 0007  10/12/18 0531 10/13/18 0500  Weight: (!) 150.9 kg (!) 156.9 kg (!) 150.1 kg   Examination:  per Dr. Rande LawmanErwin  [facility]    Assessment & Plan:   Acute on chronic hypoxic and hypercarbic respiratory failure  COVID 19 COPD OSA R/o HCAP - pre-intubation PF ratio 113 P:  Given rapid improvement with diuresis suspect CHF as major cause of respiratory decompensation Continue pressure support weans.  Will likely be ready for extubation within the next 24 hours Continue diuresis Continue nebs. Holding antibiotics.  Best practice:  Diet: NPO Pain/Anxiety/Delirium protocol (if indicated): fentanyl gtt/ versed VAP protocol (if indicated): yes DVT prophylaxis: Lovenox BID (given BMI >40) GI prophylaxis: PPI Glucose control: SSI Mobility: BR Code Status: full  Family Communication: patients sister (only [facility]) Annice PihJackie [phone] called and updated.  Disposition: ICU  Labs   CBC: Recent Labs  Lab 10/07/18 1642  10/08/18 0636 10/09/18 0803 10/12/18 0011 10/12/18 [id number] 10/12/18 0551 10/12/18 1124 10/12/18 1941 10/13/18 0352 10/13/18 0545  WBC 8.6  --  6.8 5.4 7.1 5.5  --   --   --   --  5.6  NEUTROABS 6.8  --  5.0  --  5.7  --   --   --   --   --   --   HGB 12.0   < > 10.9* 10.8* 11.5* 11.4* 12.2 10.9* 10.9* 10.9* 10.7*  HCT 41.8   < > 38.9 37.0 41.7 40.9 36.0 32.0* 32.0* 32.0* 36.4  MCV 101.5*  --  99.7 97.1 102.5* 101.2*  --   --   --   --  95.5  PLT 184  --  149* 159 171 164  --   --   --   --  166   < > = values in this interval not displayed.    Basic Metabolic Panel: Recent Labs  Lab 10/07/18 1642  10/08/18 0636 10/09/18 0803 10/12/18 0011 10/12/18 0551 10/12/18 1124 10/12/18 1142 10/12/18 1700 10/12/18 1941 10/13/18 0352 10/13/18 0545  NA 144   < > 142 141 141 139 139  --   --  140 141 142  K 4.5   < > 4.3 3.8 4.6 4.1 3.7  --   --  3.1* 2.7* 2.7*  CL 101  --  104 99 98  --   --   --   --   --   --  100  CO2 35*  --  30 37* 34*  --   --    --   --   --   --  34*  GLUCOSE 140*  --  124* 100* 129*  --   --   --   --   --   --  112*  BUN 28*  --  27* 24* 25*  --   --   --   --   --   --  44*  CREATININE 1.18*  --  1.08* 0.99 1.27*  --   --   --   --   --   --  1.12*  CALCIUM 9.4  --  8.9 8.8* 9.1  --   --   --   --   --   --  8.8*  MG 1.9  --   --   --   --   --   --  2.7* 2.1  --   --  2.0  PHOS  --   --   --   --   --   --   --  1.8* 1.2*  --   --  2.1*   < > = values in this interval not displayed.   GFR: Estimated Creatinine Clearance: 78.3 mL/min (A) (by C-G formula based on SCr of 1.12 mg/dL (H)). Recent Labs  Lab 10/08/18 0001 10/08/18 0636 10/09/18 0502 10/09/18 0803 10/12/18 0011 10/12/18 0433 10/13/18 0545  PROCALCITON <0.10 <0.10 0.16  --  0.16  --   --   WBC  --  6.8  --  5.4 7.1 5.5 5.6  LATICACIDVEN  --   --   --   --  0.5 0.9  --     Liver Function Tests: Recent Labs  Lab 10/07/18 1642 10/08/18 0636 10/12/18 0011  AST 20 18 39  ALT 20 17 33  ALKPHOS 141* 119 117  BILITOT 0.4 0.6 0.6  PROT 6.6 5.9* 6.7  ALBUMIN 3.1* 2.8* 3.1*   No results for input(s): LIPASE, AMYLASE in the last 168 hours. Recent Labs  Lab 10/08/18 0002  AMMONIA 38*    ABG    Component Value Date/Time   PHART 7.524 (H) 10/13/2018 0352   PCO2ART 44.4 10/13/2018 0352   PO2ART 82.0 (L) 10/13/2018 0352   HCO3 36.6 (H) 10/13/2018 0352   TCO2 38 (H) 10/13/2018 0352   O2SAT 97.0 10/13/2018 0352     Coagulation Profile: Recent Labs  Lab 10/07/18 1642 10/08/18 0636 10/09/18 0502 10/12/18 0544 10/13/18 0545  INR 2.3* 2.4* 2.7* 2.2* 1.8*    Cardiac Enzymes: Recent Labs  Lab 10/08/18 0001 10/12/18 0011  TROPONINI <0.03 <0.03  HbA1C: Hgb A1c MFr Bld  Date/Time Value Ref Range Status  08/25/2015 02:14 AM 11.9 (H) 4.0 - 6.0 % Final    CBG: Recent Labs  Lab 10/12/18 1237 10/12/18 1628 10/12/18 1939 10/13/18 0032 10/13/18 0300  GLUCAP 119* 107* 115* 105* 106*   The patient is critically ill with  multiple organ system failure and requires high complexity decision making for assessment and support, frequent evaluation and titration of therapies, advanced monitoring, review of radiographic studies and interpretation of complex data.   Critical Care Time devoted to patient care services, exclusive of separately billable procedures, described in this note is 35 minutes.   Chilton Greathouse MD [facility] Pager [phone] If no answer call [phone] 10/13/2018, 7:58 AM

## 2018-10-13 NOTE — Progress Notes (Signed)
ANTICOAGULATION CONSULT NOTE - Follow Up Consult  Pharmacy Consult for Warfarin Indication: atrial fibrillation  Allergies  Allergen Reactions  . Contrast Media [Iodinated Diagnostic Agents] Swelling  . Tomato Other (See Comments)    unknow per MAR  . Amoxicillin Diarrhea, Itching and Other (See Comments)    Has patient had a PCN reaction causing immediate rash, facial/tongue/throat swelling, SOB or lightheadedness with hypotension: No Has patient had a PCN reaction causing severe rash involving mucus membranes or skin necrosis: No Has patient had a PCN reaction that required hospitalization: No Has patient had a PCN reaction occurring within the last 10 years: Yes If all of the above answers are "NO", then may proceed with Cephalosporin use.     Patient Measurements: Height: '5\' 4"'  (162.6 cm) Weight: (!) 330 lb 14.4 oz (150.1 kg) IBW/kg (Calculated) : 54.7  Vital Signs: Temp: 98 F (36.7 C) (05/04 0402) Temp Source: Oral (05/04 0000) BP: 109/63 (05/04 0600) Pulse Rate: 96 (05/04 0600)  Labs: Recent Labs    10/12/18 0011 10/12/18 0433 10/12/18 0544  10/12/18 1941 10/13/18 0352 10/13/18 0545  HGB 11.5* 11.4*  --    < > 10.9* 10.9* 10.7*  HCT 41.7 40.9  --    < > 32.0* 32.0* 36.4  PLT 171 164  --   --   --   --  166  LABPROT  --   --  23.7*  --   --   --  20.7*  INR  --   --  2.2*  --   --   --  1.8*  CREATININE 1.27*  --   --   --   --   --  1.12*  TROPONINI <0.03  --   --   --   --   --   --    < > = values in this interval not displayed.    Estimated Creatinine Clearance: 78.3 mL/min (A) (by C-G formula based on SCr of 1.12 mg/dL (H)).   Medications:  Scheduled:  . aspirin  81 mg Per Tube Daily  . chlorhexidine gluconate (MEDLINE KIT)  15 mL Mouth Rinse BID  . feeding supplement (PRO-STAT SUGAR FREE 64)  30 mL Per Tube TID  . furosemide  60 mg Intravenous Q12H  . insulin aspart  0-9 Units Subcutaneous Q4H  . mouth rinse  15 mL Mouth Rinse 10 times per day   . pantoprazole (PROTONIX) IV  40 mg Intravenous QHS  . phosphorus  500 mg Per Tube TID  . Warfarin - Pharmacist Dosing Inpatient   Does not apply q1800    Assessment: 74 YOF with h/o Afib on warfarin admitted to ICU with hypercarbic respiratory failure, +COVID-19.  Pharmacy consulted to resume warfarin. INR on admission is therapeutic at 2.2.  Home warfarin regimen: 6 mg on Mon/Wed/Fri; 5 mg on other days   Today, 10/13/2018:  INR decreased to 1.8, subtherapeutic - Note missed dose on 5/2 (last home dose on 5/1, admitted 5/3) CBC: Hgb 10.7, and Plt 166 remains low/stable No bleeding or complications reported.   Goal of Therapy:  INR 2-3 Monitor platelets by anticoagulation protocol: Yes   Plan:  Warfarin 6 mg PO x1 at 18:00. Daily PT/INR. Monitor for signs and symptoms of bleeding.   Gretta Arab PharmD, BCPS 10/13/2018 7:13 AM

## 2018-10-13 NOTE — Progress Notes (Signed)
PROGRESS NOTE                                                                                                                                                                                                             Patient Demographics:    Emily Fry, is a 63 y.o. female, DOB - Jan 15, 1956, AGT:[id number]  Admit date - 10/14/2018   Admitting Physician Albertine Patricia, MD  Outpatient Primary MD for the patient is Kirk Ruths, MD  LOS - 1   Chief Complaint  Patient presents with   Shortness of Breath   Altered Mental Status       Brief Narrative    63 year old female with history of chronic respiratory failure, COPD on baseline 2L, OSA, morbid obesity, Afib on coumadin, diastolic heart failure, GERD, HTN who presents from [facility] home with one day history of increasing shortness of breath and hypoxia.   Recent hospitalization 4/28- 4/30 acute on chronic hypercarbic/ hypoxic respiratory failure with two negative COVID test.  -Presents to ED with significant dyspnea, hypoxia, requiring nonrebreather, initially in ED she did require BiPAP, she was intubated in ED, she tested positive for COVID-19, her work-up significant for volume overload as well.    Subjective:    Emily Fry with no significant events overnight as discussed with staff.   Assessment  & Plan :    Active Problems:   Acute respiratory failure with hypoxia (HCC)  Acute on chronic respiratory failure with hypercapnia -He is on baseline 2 to 3 L nasal cannula, in ED she is in severe hypercapnia with metabolic acidosis . -Intubated NAD  -Respiratory failure felt more likely due to volume overload from her CHF, more than actual lung injury from COVID-19 infection , visually with her inflammatory markers including ferritin, CRP within normal limits. -Vent management and sedation per PCCM, she is tolerating pressure support this morning, she can be  extubated in 1 to 2 days as discussed with PCCM.  COVID 19 infection -inflammatory markers within normal limit, continue to monitor limit, at this point no indication to consider Actemra  Acte Metabolic Encephalopathy -in the Setting of hypercapnia, resolved  Parox atrial fibrillation - Heart Rate controlled, blood pressure is soft, to hold Cardizem and metoprolol .  - continue with warfarin, pharmacy to dose  Acute on chronic diastolic  CHF -Continue with IV diuresis, continue with Lasix 60 mg IV twice daily, he is -1.5 L over last 24 hours, continue with daily weight, strict ins and out. -Resume home dose acetazolamide  CAD -Continue with home dose aspirin, will resume metoprolol when stable  Diabetes mellitus type 2 -CBGs are controlled, will hold home dose Lantus, continue with insulin sliding scale  Hypokalemia -Repleting, monitor closely as on IV diuresis, will give 1 g of IV magnesium as well  Hypophosphatemia -Continue with Neutra-Phos  COVID-19 Labs  Recent Labs    10/12/18 0011 10/13/18 0545  DDIMER 0.48  --   FERRITIN 65 87  LDH 284*  --   CRP <0.8 <0.8         Code Status : Full  Family Communication  : Disussed with sister via phone 10/12/2018, wil call today  Disposition Plan  : remains in ICU.  Consults  :  PCCM  Procedures  : intubated 10/12/2018  DVT Prophylaxis  :  On warfarin  Lab Results  Component Value Date   PLT 166 10/13/2018    Antibiotics  :   Anti-infectives (From admission, onward)   None        Objective:   Vitals:   10/13/18 0731 10/13/18 0835 10/13/18 0900 10/13/18 0930  BP: 113/82 110/65  118/75  Pulse: 100     Resp: (!) '24  15 15  ' Temp:      TempSrc:      SpO2: 98% 95%    Weight:      Height:        Wt Readings from Last 3 Encounters:  10/13/18 (!) 150.1 kg  10/07/18 (!) 150.6 kg  04/14/18 (!) 181.4 kg     Intake/Output Summary (Last 24 hours) at 10/13/2018 0942 Last data filed at 10/13/2018 0810 Gross  per 24 hour  Intake 725.8 ml  Output 2060 ml  Net -1334.2 ml     Physical Exam  Awake, on the vent, but actually she is awake, appears to be very comfortable, follow commands Vented respiratory sounds bilaterally, bibasilar crackles Regular Rate and rhythm, no rubs murmurs gallops Abdomen soft, obese, nontender, nondistended Midis with no cyanosis, has edema bilaterally, no clubbing or joint deformity     Data Review:    CBC Recent Labs  Lab 10/07/18 1642  10/08/18 0636 10/09/18 0803 10/12/18 0011 10/12/18 0433 10/12/18 0551 10/12/18 1124 10/12/18 1941 10/13/18 0352 10/13/18 0545  WBC 8.6  --  6.8 5.4 7.1 5.5  --   --   --   --  5.6  HGB 12.0   < > 10.9* 10.8* 11.5* 11.4* 12.2 10.9* 10.9* 10.9* 10.7*  HCT 41.8   < > 38.9 37.0 41.7 40.9 36.0 32.0* 32.0* 32.0* 36.4  PLT 184  --  149* 159 171 164  --   --   --   --  166  MCV 101.5*  --  99.7 97.1 102.5* 101.2*  --   --   --   --  95.5  MCH 29.1  --  27.9 28.3 28.3 28.2  --   --   --   --  28.1  MCHC 28.7*  --  28.0* 29.2* 27.6* 27.9*  --   --   --   --  29.4*  RDW 14.5  --  14.6 14.6 14.6 14.6  --   --   --   --  14.7  LYMPHSABS 1.1  --  1.0  --  0.6*  --   --   --   --   --   --  MONOABS 0.5  --  0.5  --  0.6  --   --   --   --   --   --   EOSABS 0.2  --  0.2  --  0.1  --   --   --   --   --   --   BASOSABS 0.0  --  0.0  --  0.0  --   --   --   --   --   --    < > = values in this interval not displayed.    Chemistries  Recent Labs  Lab 10/07/18 1642  10/08/18 0636 10/09/18 0803 10/12/18 0011 10/12/18 0551 10/12/18 1124 10/12/18 1142 10/12/18 1700 10/12/18 1941 10/13/18 0352 10/13/18 0545  NA 144   < > 142 141 141 139 139  --   --  140 141 142  K 4.5   < > 4.3 3.8 4.6 4.1 3.7  --   --  3.1* 2.7* 2.7*  CL 101  --  104 99 98  --   --   --   --   --   --  100  CO2 35*  --  30 37* 34*  --   --   --   --   --   --  34*  GLUCOSE 140*  --  124* 100* 129*  --   --   --   --   --   --  112*  BUN 28*  --  27*  24* 25*  --   --   --   --   --   --  44*  CREATININE 1.18*  --  1.08* 0.99 1.27*  --   --   --   --   --   --  1.12*  CALCIUM 9.4  --  8.9 8.8* 9.1  --   --   --   --   --   --  8.8*  MG 1.9  --   --   --   --   --   --  2.7* 2.1  --   --  2.0  AST 20  --  18  --  39  --   --   --   --   --   --   --   ALT 20  --  17  --  33  --   --   --   --   --   --   --   ALKPHOS 141*  --  119  --  117  --   --   --   --   --   --   --   BILITOT 0.4  --  0.6  --  0.6  --   --   --   --   --   --   --    < > = values in this interval not displayed.   ------------------------------------------------------------------------------------------------------------------ Recent Labs    10/12/18 0011  TRIG 86    Lab Results  Component Value Date   HGBA1C 11.9 (H) 08/25/2015   ------------------------------------------------------------------------------------------------------------------ No results for input(s): TSH, T4TOTAL, T3FREE, THYROIDAB in the last 72 hours.  Invalid input(s): FREET3 ------------------------------------------------------------------------------------------------------------------ Recent Labs    10/12/18 0011 10/13/18 0545  FERRITIN 65 87    Coagulation profile Recent Labs  Lab 10/07/18 1642 10/08/18 0636 10/09/18 0502 10/12/18 0544 10/13/18 0545  INR 2.3* 2.4* 2.7* 2.2* 1.8*  Recent Labs    10/12/18 0011  DDIMER 0.48    Cardiac Enzymes Recent Labs  Lab 10/08/18 0001 10/12/18 0011  TROPONINI <0.03 <0.03   ------------------------------------------------------------------------------------------------------------------    Component Value Date/Time   BNP 201.2 (H) 10/12/2018 0011    Inpatient Medications  Scheduled Meds:  aspirin  81 mg Per Tube Daily   chlorhexidine gluconate (MEDLINE KIT)  15 mL Mouth Rinse BID   feeding supplement (PRO-STAT SUGAR FREE 64)  30 mL Per Tube TID   furosemide  60 mg Intravenous Q12H   insulin aspart  0-9  Units Subcutaneous Q4H   mouth rinse  15 mL Mouth Rinse 10 times per day   pantoprazole (PROTONIX) IV  40 mg Intravenous QHS   phosphorus  500 mg Per Tube QID   potassium chloride  40 mEq Oral Q4H   warfarin  6 mg Oral ONCE-1800   Warfarin - Pharmacist Dosing Inpatient   Does not apply q1800   Continuous Infusions:  feeding supplement (VITAL HIGH PROTEIN) 45 mL/hr at 10/13/18 0800   fentaNYL infusion INTRAVENOUS 150 mcg/hr (10/13/18 0935)   PRN Meds:.bisacodyl, docusate, fentaNYL, Ipratropium-Albuterol, midazolam, midazolam  Micro Results Recent Results (from the past 240 hour(s))  SARS Coronavirus 2 [facility] order, Performed in [place] hospital lab)     Status: None   Collection Time: 10/07/18  6:45 PM  Result Value Ref Range Status   SARS Coronavirus 2 NEGATIVE NEGATIVE Final    Comment: (NOTE) If result is NEGATIVE SARS-CoV-2 target nucleic acids are NOT DETECTED. The SARS-CoV-2 RNA is generally detectable in upper and lower  respiratory specimens during the acute phase of infection. The lowest  concentration of SARS-CoV-2 viral copies this assay can detect is 250  copies / mL. A negative result does not preclude SARS-CoV-2 infection  and should not be used as the sole basis for treatment or other  patient management decisions.  A negative result may occur with  improper specimen collection / handling, submission of specimen other  than nasopharyngeal swab, presence of viral mutation(s) within the  areas targeted by this assay, and inadequate number of viral copies  (<250 copies / mL). A negative result must be combined with clinical  observations, patient history, and epidemiological information. If result is POSITIVE SARS-CoV-2 target nucleic acids are DETECTED. The SARS-CoV-2 RNA is generally detectable in upper and lower  respiratory specimens dur ing the acute phase of infection.  Positive  results are indicative of active infection with SARS-CoV-2.   Clinical  correlation with patient history and other diagnostic information is  necessary to determine patient infection status.  Positive results do  not rule out bacterial infection or co-infection with other viruses. If result is PRESUMPTIVE POSTIVE SARS-CoV-2 nucleic acids MAY BE PRESENT.   A presumptive positive result was obtained on the submitted specimen  and confirmed on repeat testing.  While 2019 novel coronavirus  (SARS-CoV-2) nucleic acids may be present in the submitted sample  additional confirmatory testing may be necessary for epidemiological  and / or clinical management purposes  to differentiate between  SARS-CoV-2 and other Sarbecovirus currently known to infect humans.  If clinically indicated additional testing with an alternate test  methodology [phone]) is advised. The SARS-CoV-2 RNA is generally  detectable in upper and lower respiratory sp ecimens during the acute  phase of infection. The expected result is Negative. Fact Sheet for Patients:  StrictlyIdeas.no Fact Sheet for Healthcare Providers: BankingDealers.co.za This test is not yet approved or cleared by the  [place] Architectural technologist and has been authorized for detection and/or diagnosis of SARS-CoV-2 by FDA under an Print production planner (EUA).  This EUA will remain in effect (meaning this test can be used) for the duration of the COVID-19 declaration under Section 564(b)(1) of the Act, 21 U.S.C. section 360bbb-3(b)(1), unless the authorization is terminated or revoked sooner. Performed at [facility], [place] [address] [place], [place] [address]   Novel Coronavirus, NAA (hospital order; send-out to ref lab)     Status: None   Collection Time: 10/07/18  6:45 PM  Result Value Ref Range Status   SARS-CoV-2, NAA NOT DETECTED NOT DETECTED Final    Comment: (NOTE) This test was developed and its performance characteristics determined by [employer]. This test has not been FDA cleared or approved. This test has been authorized by FDA under an Emergency Use Authorization (EUA). This test has been validated in accordance with the FDA's Guidance Document (Policy for [place] in Laboratories Certified to Perform High Complexity Testing under CLIA prior to Emergency Use Authorization for Coronavirus [id number] during the [facility] Emergency) issued on February 29th, 2020. FDA independent review of this validation is pending. This test is only authorized for the duration of time the declaration that circumstances exist justifying the authorization of the emergency use of in vitro diagnostic tests for detection of SARS-CoV- 2 virus and/or diagnosis of COVID-19 infection under section 564(b)(1) of the Act, 21 U.S.C. 245YKD-9(I)(3), unless the authorization is terminated or revoked sooner. Performed At: [facility] [address] [place], [place] [id number] Rush Farmer MD QB:[id number]    [place]  Final    Comment: Performed at [facility], [place] [address], [place], [place] [address]  Respiratory Panel by PCR     Status: None   Collection Time: 10/07/18  6:45 PM  Result Value Ref Range Status   Adenovirus NOT DETECTED NOT DETECTED Final   Coronavirus 229E NOT DETECTED NOT DETECTED Final    Comment: (NOTE) The Coronavirus on the Respiratory Panel, DOES NOT test for the novel  Coronavirus (2019 nCoV)    Coronavirus HKU1 NOT DETECTED NOT DETECTED Final   Coronavirus NL63 NOT DETECTED NOT DETECTED Final   Coronavirus OC43 NOT DETECTED NOT DETECTED Final   Metapneumovirus NOT DETECTED NOT DETECTED Final   Rhinovirus / Enterovirus NOT DETECTED NOT DETECTED Final   Influenza A NOT DETECTED NOT DETECTED Final   Influenza B NOT DETECTED NOT DETECTED Final   Parainfluenza Virus 1 NOT DETECTED NOT DETECTED Final   Parainfluenza Virus 2 NOT DETECTED NOT DETECTED Final    Parainfluenza Virus 3 NOT DETECTED NOT DETECTED Final   Parainfluenza Virus 4 NOT DETECTED NOT DETECTED Final   Respiratory Syncytial Virus NOT DETECTED NOT DETECTED Final   Bordetella pertussis NOT DETECTED NOT DETECTED Final   Chlamydophila pneumoniae NOT DETECTED NOT DETECTED Final   Mycoplasma pneumoniae NOT DETECTED NOT DETECTED Final    Comment: Performed at [facility], [place]. [address], [place],  [address]  Culture, blood (routine x 2) Call MD if unable to obtain prior to antibiotics being given     Status: Abnormal   Collection Time: 10/07/18 11:45 PM  Result Value Ref Range Status   Specimen Description BLOOD RIGHT ARM  Final   Special Requests   Final    BOTTLES DRAWN AEROBIC AND ANAEROBIC Blood Culture results may not be optimal due to an excessive volume of blood received in culture bottles  Culture  Setup Time   Final    GRAM POSITIVE COCCI AEROBIC BOTTLE ONLY CRITICAL RESULT CALLED TO, READ BACK BY AND VERIFIED WITH: PHRMD J ORIET '@0354'  10/09/18 BY S GEZAHEGN    Culture (A)  Final    STAPHYLOCOCCUS SPECIES (COAGULASE NEGATIVE) THE SIGNIFICANCE OF ISOLATING THIS ORGANISM FROM A SINGLE VENIPUNCTURE CANNOT BE PREDICTED WITHOUT FURTHER CLINICAL AND CULTURE CORRELATION. SUSCEPTIBILITIES AVAILABLE ONLY ON REQUEST. Performed at [facility], [place] [address] [place], [place] [address]    Report Status 11/09/2018 FINAL  Final  Blood Culture ID Panel (Reflexed)     Status: Abnormal   Collection Time: 10/07/18 11:45 PM  Result Value Ref Range Status   Enterococcus species NOT DETECTED NOT DETECTED Final   Listeria monocytogenes NOT DETECTED NOT DETECTED Final   Staphylococcus species DETECTED (A) NOT DETECTED Final    Comment: Methicillin (oxacillin) resistant coagulase negative staphylococcus. Possible blood culture contaminant (unless isolated from more than one blood culture draw or clinical case suggests pathogenicity). No antibiotic treatment is indicated for  blood  culture contaminants. CRITICAL RESULT CALLED TO, READ BACK BY AND VERIFIED WITH: PHRMD J ORIET '@0354'  10/09/18 BY S GEZAHEGN    Staphylococcus aureus (BCID) NOT DETECTED NOT DETECTED Final   Methicillin resistance DETECTED (A) NOT DETECTED Final    Comment: CRITICAL RESULT CALLED TO, READ BACK BY AND VERIFIED WITH: PHRMD J ORIET '@0354'  10/09/18 BY S GEZAHEGN    Streptococcus species NOT DETECTED NOT DETECTED Final   Streptococcus agalactiae NOT DETECTED NOT DETECTED Final   Streptococcus pneumoniae NOT DETECTED NOT DETECTED Final   Streptococcus pyogenes NOT DETECTED NOT DETECTED Final   Acinetobacter baumannii NOT DETECTED NOT DETECTED Final   Enterobacteriaceae species NOT DETECTED NOT DETECTED Final   Enterobacter cloacae complex NOT DETECTED NOT DETECTED Final   Escherichia coli NOT DETECTED NOT DETECTED Final   Klebsiella oxytoca NOT DETECTED NOT DETECTED Final   Klebsiella pneumoniae NOT DETECTED NOT DETECTED Final   Proteus species NOT DETECTED NOT DETECTED Final   Serratia marcescens NOT DETECTED NOT DETECTED Final   Haemophilus influenzae NOT DETECTED NOT DETECTED Final   Neisseria meningitidis NOT DETECTED NOT DETECTED Final   Pseudomonas aeruginosa NOT DETECTED NOT DETECTED Final   Candida albicans NOT DETECTED NOT DETECTED Final   Candida glabrata NOT DETECTED NOT DETECTED Final   Candida krusei NOT DETECTED NOT DETECTED Final   Candida parapsilosis NOT DETECTED NOT DETECTED Final   Candida tropicalis NOT DETECTED NOT DETECTED Final    Comment: Performed at [facility], [place]. [address] [place], [place] [address]  Culture, blood (routine x 2) Call MD if unable to obtain prior to antibiotics being given     Status: None   Collection Time: 10/08/18 12:08 AM  Result Value Ref Range Status   Specimen Description BLOOD RIGHT HAND  Final   Special Requests   Final    BOTTLES DRAWN AEROBIC ONLY Blood Culture adequate volume   Culture   Final    NO GROWTH 5  DAYS Performed at [facility], [place] [address], [place], [place] [address]    Report Status 10/13/2018 FINAL  Final  MRSA PCR Screening     Status: None   Collection Time: 10/08/18  1:06 AM  Result Value Ref Range Status   MRSA by PCR NEGATIVE NEGATIVE Final    Comment:        The GeneXpert MRSA Assay (FDA approved for NASAL specimens only), is one component of  a comprehensive MRSA colonization surveillance program. It is not intended to diagnose MRSA infection nor to guide or monitor treatment for MRSA infections. Performed at [facility], [place] [address], [place], [place] [address]   Urine culture     Status: Abnormal   Collection Time: 10/08/18  1:34 PM  Result Value Ref Range Status   Specimen Description URINE, CLEAN CATCH  Final   Special Requests   Final    Immunocompromised Performed at [facility], [place] [address] [place], [place] [address]    Culture MULTIPLE SPECIES PRESENT, SUGGEST RECOLLECTION (A)  Final   Report Status 10/09/2018 FINAL  Final  SARS Coronavirus 2 [facility] order, Performed in [place] hospital lab)     Status: Abnormal   Collection Time: 10/12/18 12:11 AM  Result Value Ref Range Status   SARS Coronavirus 2 POSITIVE (A) NEGATIVE Final    Comment: RESULT CALLED TO, READ BACK BY AND VERIFIED WITH: K MUNNET '@0159'  10/12/18 BY S GEZAHEGN (NOTE) If result is NEGATIVE SARS-CoV-2 target nucleic acids are NOT DETECTED. The SARS-CoV-2 RNA is generally detectable in upper and lower  respiratory specimens during the acute phase of infection. The lowest  concentration of SARS-CoV-2 viral copies this assay can detect is 250  copies / mL. A negative result does not preclude SARS-CoV-2 infection  and should not be used as the sole basis for treatment or other  patient management decisions.  A negative result may occur with  improper specimen collection / handling, submission of specimen other  than nasopharyngeal swab, presence of viral  mutation(s) within the  areas targeted by this assay, and inadequate number of viral copies  (<250 copies / mL). A negative result must be combined with clinical  observations, patient history, and epidemiological information. If result is POSITIVE SARS-CoV-2 target nucleic acids are DETECTED. T he SARS-CoV-2 RNA is generally detectable in upper and lower  respiratory specimens during the acute phase of infection.  Positive  results are indicative of active infection with SARS-CoV-2.  Clinical  correlation with patient history and other diagnostic information is  necessary to determine patient infection status.  Positive results do  not rule out bacterial infection or co-infection with other viruses. If result is PRESUMPTIVE POSTIVE SARS-CoV-2 nucleic acids MAY BE PRESENT.   A presumptive positive result was obtained on the submitted specimen  and confirmed on repeat testing.  While 2019 novel coronavirus  (SARS-CoV-2) nucleic acids may be present in the submitted sample  additional confirmatory testing may be necessary for epidemiological  and / or clinical management purposes  to differentiate between  SARS-CoV-2 and other Sarbecovirus currently known to infect humans.  If clinically indicated additional testing with an alternate test  methodology [phone]) is  advised. The SARS-CoV-2 RNA is generally  detectable in upper and lower respiratory specimens during the acute  phase of infection. The expected result is Negative. Fact Sheet for Patients:  StrictlyIdeas.no Fact Sheet for Healthcare Providers: BankingDealers.co.za This test is not yet approved or cleared by the [place] FDA and has been authorized for detection and/or diagnosis of SARS-CoV-2 by FDA under an Emergency Use Authorization (EUA).  This EUA will remain in effect (meaning this test can be used) for the duration of the COVID-19 declaration under Section 564(b)(1)  of the Act, 21 U.S.C. section 360bbb-3(b)(1), unless the authorization is terminated or revoked sooner. Performed at [facility], [place] [address] [place], [place] [address]   Blood Culture (routine x  2)     Status: None (Preliminary result)   Collection Time: 10/12/18 12:11 AM  Result Value Ref Range Status   Specimen Description BLOOD RIGHT HAND  Final   Special Requests   Final    BOTTLES DRAWN AEROBIC AND ANAEROBIC Blood Culture results may not be optimal due to an inadequate volume of blood received in culture bottles   Culture   Final    NO GROWTH 1 DAY Performed at [facility], [place] [address], [place], [place] [address]    Report Status PENDING  Incomplete  Blood Culture (routine x 2)     Status: None (Preliminary result)   Collection Time: 10/12/18 12:32 AM  Result Value Ref Range Status   Specimen Description BLOOD SITE NOT SPECIFIED  Final   Special Requests   Final    BOTTLES DRAWN AEROBIC AND ANAEROBIC Blood Culture results may not be optimal due to an inadequate volume of blood received in culture bottles   Culture   Final    NO GROWTH 1 DAY Performed at [facility], [place] [address], [place], [place] [address]    Report Status PENDING  Incomplete  Culture, respiratory (non-expectorated)     Status: None (Preliminary result)   Collection Time: 10/12/18  8:15 AM  Result Value Ref Range Status   Specimen Description TRACHEAL ASPIRATE  Final   Special Requests NONE  Final   Gram Stain   Final    RARE WBC PRESENT, PREDOMINANTLY PMN NO ORGANISMS SEEN Performed at [facility], [place] [address], [place], [place] [address]    Culture PENDING  Incomplete   Report Status PENDING  Incomplete    Radiology Reports Dg Abd 1 View  Result Date: 10/12/2018 CLINICAL DATA:  Enteric tube placement. EXAM: ABDOMEN - 1 VIEW COMPARISON:  CT abdomen pelvis dated May 08, 2014. FINDINGS: Enteric tube tip in the stomach. The visualized bowel gas pattern is  normal. No radio-opaque calculi or other significant radiographic abnormality are seen. No acute osseous abnormality. IMPRESSION: 1. Enteric tube in the stomach. Electronically Signed   By: Titus Dubin M.D.   On: 10/12/2018 06:24   Ct Head Wo Contrast  Result Date: 10/07/2018 CLINICAL DATA:  Seizure.  Bilateral hand twitching EXAM: CT HEAD WITHOUT CONTRAST TECHNIQUE: Contiguous axial images were obtained from the base of the skull through the vertex without intravenous contrast. COMPARISON:  None. FINDINGS: Brain: Ventricle size and cerebral volume normal. Negative for acute infarct. Negative for hemorrhage or mass. Vascular: Negative for hyperdense vessel Skull: Negative Sinuses/Orbits: Bilateral exophthalmos. No orbital mass. Paranasal sinuses clear. Other: None IMPRESSION: No acute intracranial abnormality Exophthalmos. Electronically Signed   By: Franchot Gallo M.D.   On: 10/07/2018 17:10   Dg Chest Port 1 View  Result Date: 10/13/2018 CLINICAL DATA:  Intubation EXAM: PORTABLE CHEST 1 VIEW COMPARISON:  Yesterday FINDINGS: Endotracheal tube tip between the clavicular heads and carina. The orogastric tube at least reaches the stomach. Cardiomegaly and vascular pedicle widening. Low lung volumes with diffuse pulmonary opacification that is similar to prior. No air leak or definite/increasing pleural fluid. IMPRESSION: Unchanged hardware positioning and bilateral airspace disease. Electronically Signed   By: Monte Fantasia M.D.   On: 10/13/2018 08:04   Dg Chest Port 1 View  Result Date: 10/12/2018 CLINICAL DATA:  Endotracheal tube placement. EXAM: PORTABLE CHEST 1 VIEW COMPARISON:  Chest x-ray from same day at 3:36 a.m. FINDINGS: Endotracheal tube in place with the tip now 8 mm  above the carina. Enteric tube in the stomach. Stable cardiomegaly. Persistent low lung volumes with moderate pulmonary edema and small bilateral pleural effusions. Slightly improved aeration at the right lung base. Persistent  dense retrocardiac opacification, likely atelectasis. No pneumothorax. No acute osseous abnormality. IMPRESSION: 1. Endotracheal tube tip now 8 mm above the carina. Consider retracting an additional 2 cm. 2. Unchanged congestive heart failure. Slightly improved aeration at the right lung base. Electronically Signed   By: Titus Dubin M.D.   On: 10/12/2018 06:28   Dg Chest Port 1 View  Result Date: 10/12/2018 CLINICAL DATA:  Follow-up examination status post endotracheal tube repositioning. EXAM: PORTABLE CHEST 1 VIEW COMPARISON:  Prior radiograph earlier same day. FINDINGS: Tip of the endotracheal tube still remains deep within the right mainstem bronchus. Retraction by approximately 3.5 cm recommended. Remainder of the examination remains unchanged. IMPRESSION: Tip of endotracheal tube remains deep within the right mainstem bronchus. Retraction by approximately 3-3.5 cm recommended. Critical Value/emergent results were called by telephone at the time of interpretation on 10/12/2018 at 4:51 am to Dr. Delora Fuel , who verbally acknowledged these results. Electronically Signed   By: Jeannine Boga M.D.   On: 10/12/2018 04:51   Dg Chest Port 1 View  Result Date: 10/12/2018 CLINICAL DATA:  Initial evaluation for intubation. EXAM: PORTABLE CHEST 1 VIEW COMPARISON:  Prior radiograph from earlier the same day. FINDINGS: Endotracheal tube in place, position deep with tip in the right mainstem bronchus. At time of this dictation, a second film has been obtained. Tip still appears to be within the right mainstem bronchus. Enteric tube courses into the abdomen. Stable cardiomegaly. Low lung volumes. Pulmonary edema with bilateral pleural effusions again seen, similar to previous. Probable superimposed bibasilar atelectasis. No pneumothorax. Osseous structures unchanged. IMPRESSION: 1. Tip of endotracheal tube positioned within the right mainstem bronchus. At time of this dictation, a second filled with a  repositioned tube has already been acquired. 2. Diffuse pulmonary edema and small bilateral pleural effusions, suggesting CHF. 3. Probable superimposed bibasilar atelectasis. 4. Electronically Signed   By: Jeannine Boga M.D.   On: 10/12/2018 04:47   Dg Chest Port 1 View  Result Date: 10/12/2018 CLINICAL DATA:  Altered mental status, shortness of breath EXAM: PORTABLE CHEST 1 VIEW COMPARISON:  10/07/2018 FINDINGS: Cardiomegaly. Diffuse bilateral airspace disease, favor edema/CHF. Suspect layering effusions. Very low lung volumes. No acute bony abnormality. IMPRESSION: Cardiomegaly with severe diffuse bilateral airspace disease and suspected layering effusions, likely edema/CHF. Low lung volumes. Electronically Signed   By: Rolm Baptise M.D.   On: 10/12/2018 01:01   Dg Chest Portable 1 View  Result Date: 10/07/2018 CLINICAL DATA:  Tremor.  Cough. EXAM: PORTABLE CHEST 1 VIEW COMPARISON:  04/14/2018 and 05/17/2008 FINDINGS: There is abnormal increased density at the left lung base, new since the prior study. Heart size and pulmonary vascularity are normal. There is a new small focal area of linear atelectasis in the right upper lobe. No effusions. IMPRESSION: Area of new infiltrate at the left lung base. Minimal atelectasis in the right upper lobe. Electronically Signed   By: Lorriane Shire M.D.   On: 10/07/2018 17:14    Phillips Climes M.D on 10/13/2018 at 9:42 AM  Between 7am to 7pm - Pager - [phone]  After 7pm go to [email or web address] - password [facility]  [facility] -  Office  [phone]

## 2018-10-14 ENCOUNTER — Inpatient Hospital Stay (HOSPITAL_COMMUNITY): Payer: Medicare Other

## 2018-10-14 ENCOUNTER — Inpatient Hospital Stay: Payer: Self-pay

## 2018-10-14 DIAGNOSIS — I5033 Acute on chronic diastolic (congestive) heart failure: Secondary | ICD-10-CM

## 2018-10-14 LAB — BASIC METABOLIC PANEL
Anion gap: 10 (ref 5–15)
BUN: 64 mg/dL — ABNORMAL HIGH (ref 6–20)
CO2: 35 mmol/L — ABNORMAL HIGH (ref 22–32)
Calcium: 8.3 mg/dL — ABNORMAL LOW (ref 8.9–10.3)
Chloride: 101 mmol/L (ref 98–111)
Creatinine, Ser: 1.35 mg/dL — ABNORMAL HIGH (ref 0.44–1.00)
GFR calc Af Amer: 49 mL/min — ABNORMAL LOW (ref >60)
GFR calc non Af Amer: 43 mL/min — ABNORMAL LOW (ref >60)
Glucose, Bld: 117 mg/dL — ABNORMAL HIGH (ref 70–99)
Potassium: 3.9 mmol/L (ref 3.5–5.1)
Sodium: 146 mmol/L — ABNORMAL HIGH (ref 135–145)

## 2018-10-14 LAB — CBC
HCT: 41.5 % (ref 36.0–46.0)
Hemoglobin: 11.1 g/dL — ABNORMAL LOW (ref 12.0–15.0)
MCH: 27.5 pg (ref 26.0–34.0)
MCHC: 26.7 g/dL — ABNORMAL LOW (ref 30.0–36.0)
MCV: 103 fL — ABNORMAL HIGH (ref 80.0–100.0)
Platelets: 148 10*3/uL — ABNORMAL LOW (ref 150–400)
RBC: 4.03 MIL/uL (ref 3.87–5.11)
RDW: 15.5 % (ref 11.5–15.5)
WBC: 6.4 10*3/uL (ref 4.0–10.5)
nRBC: 1.3 % — ABNORMAL HIGH (ref 0.0–0.2)

## 2018-10-14 LAB — POCT I-STAT 7, (LYTES, BLD GAS, ICA,H+H)
Acid-Base Excess: 9 mmol/L — ABNORMAL HIGH (ref 0.0–2.0)
Bicarbonate: 36.6 mmol/L — ABNORMAL HIGH (ref 20.0–28.0)
Calcium, Ion: 1.08 mmol/L — ABNORMAL LOW (ref 1.15–1.40)
HCT: 34 % — ABNORMAL LOW (ref 36.0–46.0)
Hemoglobin: 11.6 g/dL — ABNORMAL LOW (ref 12.0–15.0)
O2 Saturation: 90 %
Potassium: 3.6 mmol/L (ref 3.5–5.1)
Sodium: 146 mmol/L — ABNORMAL HIGH (ref 135–145)
TCO2: 39 mmol/L — ABNORMAL HIGH (ref 22–32)
pCO2 arterial: 64.1 mmHg — ABNORMAL HIGH (ref 32.0–48.0)
pH, Arterial: 7.365 (ref 7.350–7.450)
pO2, Arterial: 62 mmHg — ABNORMAL LOW (ref 83.0–108.0)

## 2018-10-14 LAB — C-REACTIVE PROTEIN: CRP: 8.4 mg/dL — ABNORMAL HIGH (ref <1.0)

## 2018-10-14 LAB — CULTURE, RESPIRATORY W GRAM STAIN: Culture: NORMAL

## 2018-10-14 LAB — GLUCOSE, CAPILLARY
Glucose-Capillary: 100 mg/dL — ABNORMAL HIGH (ref 70–99)
Glucose-Capillary: 106 mg/dL — ABNORMAL HIGH (ref 70–99)
Glucose-Capillary: 109 mg/dL — ABNORMAL HIGH (ref 70–99)
Glucose-Capillary: 110 mg/dL — ABNORMAL HIGH (ref 70–99)
Glucose-Capillary: 116 mg/dL — ABNORMAL HIGH (ref 70–99)
Glucose-Capillary: 117 mg/dL — ABNORMAL HIGH (ref 70–99)
Glucose-Capillary: 138 mg/dL — ABNORMAL HIGH (ref 70–99)

## 2018-10-14 LAB — PROTIME-INR
INR: 1.7 — ABNORMAL HIGH (ref 0.8–1.2)
Prothrombin Time: 20.1 seconds — ABNORMAL HIGH (ref 11.4–15.2)

## 2018-10-14 LAB — FERRITIN: Ferritin: 132 ng/mL (ref 11–307)

## 2018-10-14 LAB — CULTURE, RESPIRATORY

## 2018-10-14 MED ORDER — ROCURONIUM BROMIDE 50 MG/5ML IV SOSY
PREFILLED_SYRINGE | INTRAVENOUS | Status: AC
  Filled 2018-10-14: qty 10

## 2018-10-14 MED ORDER — SODIUM CHLORIDE 0.9% FLUSH: 10.0000 mL | INTRAVENOUS | Status: DC | PRN

## 2018-10-14 MED ORDER — CHLORHEXIDINE GLUCONATE CLOTH 2 % EX PADS
6.0000 | MEDICATED_PAD | Freq: Every day | CUTANEOUS | Status: DC
  Administered 2018-10-14 – 2018-10-18 (×6): 6 via TOPICAL

## 2018-10-14 MED ORDER — STERILE WATER FOR INJECTION IJ SOLN
INTRAMUSCULAR | Status: AC
  Filled 2018-10-14: qty 10

## 2018-10-14 MED ORDER — FENTANYL CITRATE (PF) 100 MCG/2ML IJ SOLN
INTRAMUSCULAR | Status: AC
  Administered 2018-10-14: 100 ug
  Filled 2018-10-14: qty 2

## 2018-10-14 MED ORDER — MIDAZOLAM HCL 2 MG/2ML IJ SOLN
INTRAMUSCULAR | Status: AC
  Administered 2018-10-14: 2 mg
  Filled 2018-10-14: qty 4

## 2018-10-14 MED ORDER — SODIUM CHLORIDE 0.9 % IV BOLUS
250.0000 mL | Freq: Once | INTRAVENOUS | Status: AC
  Administered 2018-10-14: 13:00:00 250 mL via INTRAVENOUS

## 2018-10-14 MED ORDER — WARFARIN SODIUM 4 MG PO TABS
8.0000 mg | ORAL_TABLET | Freq: Once | ORAL | Status: AC
  Administered 2018-10-14: 17:00:00 8 mg via ORAL
  Filled 2018-10-14: qty 2

## 2018-10-14 MED ORDER — FUROSEMIDE 10 MG/ML IJ SOLN
40.0000 mg | Freq: Two times a day (BID) | INTRAMUSCULAR | Status: DC
  Administered 2018-10-14 – 2018-10-15 (×3): 40 mg via INTRAVENOUS
  Filled 2018-10-14 (×3): qty 4

## 2018-10-14 MED ORDER — MIDAZOLAM HCL 2 MG/2ML IJ SOLN
4.0000 mg | Freq: Once | INTRAMUSCULAR | Status: AC
  Administered 2018-10-14: 2 mg via INTRAVENOUS

## 2018-10-14 MED ORDER — ETOMIDATE 2 MG/ML IV SOLN
20.0000 mg | Freq: Once | INTRAVENOUS | Status: AC
  Administered 2018-10-14: 13:00:00 20 mg via INTRAVENOUS

## 2018-10-14 MED ORDER — ROCURONIUM BROMIDE 50 MG/5ML IV SOLN
80.0000 mg | Freq: Once | INTRAVENOUS | Status: AC
  Administered 2018-10-14: 80 mg via INTRAVENOUS
  Filled 2018-10-14: qty 8

## 2018-10-14 MED ORDER — NOREPINEPHRINE 4 MG/250ML-% IV SOLN
INTRAVENOUS | Status: AC
  Filled 2018-10-14: qty 250

## 2018-10-14 MED ORDER — FENTANYL CITRATE (PF) 100 MCG/2ML IJ SOLN
100.0000 ug | Freq: Once | INTRAMUSCULAR | Status: AC
  Administered 2018-10-14: 13:00:00 100 ug via INTRAVENOUS

## 2018-10-14 MED ORDER — ETOMIDATE 2 MG/ML IV SOLN
INTRAVENOUS | Status: AC
  Administered 2018-10-14: 20 mg
  Filled 2018-10-14: qty 20

## 2018-10-14 NOTE — Progress Notes (Addendum)
Elink video done with patient's sister Annice Pih. Patient in no pain or distress. Patient was able to right in order to communicate with sister.

## 2018-10-14 NOTE — Progress Notes (Signed)
Peripherally Inserted Central Catheter/Midline Placement  The IV Nurse has discussed with the patient and/or persons authorized to consent for the patient, the purpose of this procedure and the potential benefits and risks involved with this procedure.  The benefits include less needle sticks, lab draws from the catheter, and the patient may be discharged home with the catheter. Risks include, but not limited to, infection, bleeding, blood clot (thrombus formation), and puncture of an artery; nerve damage and irregular heartbeat and possibility to perform a PICC exchange if needed/ordered by physician.  Alternatives to this procedure were also discussed.  B[employer] PICC patient education guide, fact sheet on infection prevention and patient information card has been provided to patient /or left at bedside.    PICC/Midline Placement Documentation  PICC Double Lumen 10/14/18 PICC Left Brachial 53 cm 0 cm (Active)  Indication for Insertion or Continuance of Line Limited venous access - need for IV therapy >5 days (PICC only) 10/14/2018  4:29 PM  Exposed Catheter (cm) 0 cm 10/14/2018  4:29 PM  Site Assessment Clean;Dry;Intact 10/14/2018  4:29 PM  Lumen #1 Status Flushed;Saline locked;Blood return noted 10/14/2018  4:29 PM  Lumen #2 Status Flushed;Saline locked;Blood return noted 10/14/2018  4:29 PM  Dressing Type Transparent;Securing device 10/14/2018  4:29 PM  Dressing Status Clean;Dry;Intact;Antimicrobial disc in place 10/14/2018  4:29 PM  Dressing Change Due 10/21/18 10/14/2018  4:29 PM       Annett Fabian 10/14/2018, 4:31 PM

## 2018-10-14 NOTE — Progress Notes (Signed)
PROGRESS NOTE                                                                                                                                                                                                             Patient Demographics:    Emily Fry, is a 63 y.o. female, DOB - [birth year], XAJ:[id number]  Admit date - 10/27/2018   Admitting Physician Albertine Patricia, MD  Outpatient Primary MD for the patient is Kirk Ruths, MD  LOS - 2   Chief Complaint  Patient presents with   Shortness of Breath   Altered Mental Status       Brief Narrative    63 year old female with history of chronic respiratory failure, COPD on baseline 2L, OSA, morbid obesity, Afib on coumadin, diastolic heart failure, GERD, HTN who presents from [facility] home with one day history of increasing shortness of breath and hypoxia.   Recent hospitalization 4/28- 4/30 acute on chronic hypercarbic/ hypoxic respiratory failure with two negative COVID test.  -Presents to ED with significant dyspnea, hypoxia, requiring nonrebreather, initially in ED she did require BiPAP, she was intubated in ED, she tested positive for COVID-19, her work-up significant for volume overload as well, successfully extubated 10/14/2018    Subjective:    Emily Fry with no significant events overnight as discussed with staff.   Assessment  & Plan :    Active Problems:   Type 2 diabetes mellitus with hyperosmolar nonketotic hyperglycemia (HCC)   A-fib (HCC)   COPD (chronic obstructive pulmonary disease) (HCC)   Gout   Benign essential HTN   Acute respiratory failure with hypoxia (HCC)   Acute on chronic diastolic heart failure (HCC)   Morbid obesity (HCC)  Acute on chronic respiratory failure with hypercapnia -She is on baseline 2 to 3 L nasal cannula, in ED she is in severe hypercapnia with metabolic acidosis . -Intubated in ED -Respiratory failure felt more likely  due to volume overload from her CHF, more than actual lung injury from COVID-19 infection . -Vent management and sedation per PCCM, he is tolerating pressure support although yesterday, and this morning she has been present for 1 hour, PCCM MD at bedside, and will extubate today .  COVID 19 infection -Respiratory failure currently more likely related to volume overload than lung injury from COVID-19 infection,  her CRP did increase today from < 0.8 to 8.4, as well ferritin started to increase, so we will monitor closely and keep trending these markers. - at this point no indication to consider Actemra  Acute Metabolic Encephalopathy -in the Setting of hypercapnia, resolved  Parox atrial fibrillation -Heart rate was uncontrolled yesterday, resumed back on her metoprolol and Cardizem - continue with warfarin, pharmacy to dose  Acute on chronic diastolic CHF -Continue with IV diuresis, has increased renal function today, as well started to have elevated her sodium level, will decrease her Lasix to 40 mg IV twice daily .; - continue with daily weight, strict ins and out. -Resume home dose acetazolamide  CAD -Continue with home dose aspirin, continue with metoprolol  Diabetes mellitus type 2 -CBGs are controlled, will hold home dose Lantus, continue with insulin sliding scale  Hypokalemia -Repleting, monitor closely as on IV diuresis, will give 1 g of IV magnesium as well  Hypophosphatemia -Continue with Neutra-Phos  Hypernatremia - sodium 146 today, once she will be extubated today, she will be started on oral fluids COVID-19 Labs  Recent Labs    10/12/18 0011 10/13/18 0545 10/14/18 0515  DDIMER 0.48  --   --   FERRITIN 65 87 132  LDH 284*  --   --   CRP <0.8 <0.8 8.4*         Code Status : Full  Family Communication  : Disussed with sister via phone 10/13/2018.  Disposition Plan  : remains in ICU.  Consults  :  PCCM  Procedures  : intubated 10/12/2018  DVT  Prophylaxis  :  On warfarin  Lab Results  Component Value Date   PLT 148 (L) 10/14/2018    Antibiotics  :   Anti-infectives (From admission, onward)   None        Objective:   Vitals:   10/14/18 0735 10/14/18 0747 10/14/18 0800 10/14/18 0907  BP:   126/62   Pulse: (!) 107  (!) 111   Resp: 18  16   Temp:   99.4 F (37.4 C)   TempSrc:   Axillary   SpO2: 95% 95% 96% 100%  Weight:      Height:        Wt Readings from Last 3 Encounters:  10/13/18 (!) 150.1 kg  10/07/18 (!) 150.6 kg  04/14/18 (!) 181.4 kg     Intake/Output Summary (Last 24 hours) at 10/14/2018 0931 Last data filed at 10/14/2018 0800 Gross per 24 hour  Intake 1308.86 ml  Output 1405 ml  Net -96.14 ml     Physical Exam  Awake Alert, Oriented X 3, debated,  gives thumbs up when she was told she will be extubated Symmetrical Chest wall movement, Good air movement bilaterally, CTAB RRR,No Gallops,Rubs or new Murmurs, No Parasternal Heave +ve B.Sounds, Abd Soft, No tenderness, No rebound - guarding or rigidity. No Cyanosis, Clubbing , mild  edema, No new Rash or bruise       Data Review:    CBC Recent Labs  Lab 10/07/18 1642  10/08/18 0636 10/09/18 0803 10/12/18 0011 10/12/18 0102  10/12/18 1124 10/12/18 1941 10/13/18 0352 10/13/18 0545 10/14/18 0515  WBC 8.6  --  6.8 5.4 7.1 5.5  --   --   --   --  5.6 6.4  HGB 12.0   < > 10.9* 10.8* 11.5* 11.4*   < > 10.9* 10.9* 10.9* 10.7* 11.1*  HCT 41.8   < > 38.9 37.0 41.7 40.9   < >  32.0* 32.0* 32.0* 36.4 41.5  PLT 184  --  149* 159 171 164  --   --   --   --  166 148*  MCV 101.5*  --  99.7 97.1 102.5* 101.2*  --   --   --   --  95.5 103.0*  MCH 29.1  --  27.9 28.3 28.3 28.2  --   --   --   --  28.1 27.5  MCHC 28.7*  --  28.0* 29.2* 27.6* 27.9*  --   --   --   --  29.4* 26.7*  RDW 14.5  --  14.6 14.6 14.6 14.6  --   --   --   --  14.7 15.5  LYMPHSABS 1.1  --  1.0  --  0.6*  --   --   --   --   --   --   --   MONOABS 0.5  --  0.5  --  0.6  --   --    --   --   --   --   --   EOSABS 0.2  --  0.2  --  0.1  --   --   --   --   --   --   --   BASOSABS 0.0  --  0.0  --  0.0  --   --   --   --   --   --   --    < > = values in this interval not displayed.    Chemistries  Recent Labs  Lab 10/07/18 1642  10/08/18 0636 10/09/18 0803 10/12/18 0011  10/12/18 1142 10/12/18 1700 10/12/18 1941 10/13/18 0352 10/13/18 0545 10/13/18 1530 10/14/18 0515  NA 144   < > 142 141 141   < >  --   --  140 141 142 144 146*  K 4.5   < > 4.3 3.8 4.6   < >  --   --  3.1* 2.7* 2.7* 3.9 3.9  CL 101  --  104 99 98  --   --   --   --   --  100 100 101  CO2 35*  --  30 37* 34*  --   --   --   --   --  34* 34* 35*  GLUCOSE 140*  --  124* 100* 129*  --   --   --   --   --  112* 135* 117*  BUN 28*  --  27* 24* 25*  --   --   --   --   --  44* 48* 64*  CREATININE 1.18*  --  1.08* 0.99 1.27*  --   --   --   --   --  1.12* 1.10* 1.35*  CALCIUM 9.4  --  8.9 8.8* 9.1  --   --   --   --   --  8.8* 8.8* 8.3*  MG 1.9  --   --   --   --   --  2.7* 2.1  --   --  2.0 2.1  --   AST 20  --  18  --  39  --   --   --   --   --   --   --   --   ALT 20  --  17  --  33  --   --   --   --   --   --   --   --  ALKPHOS 141*  --  119  --  117  --   --   --   --   --   --   --   --   BILITOT 0.4  --  0.6  --  0.6  --   --   --   --   --   --   --   --    < > = values in this interval not displayed.   ------------------------------------------------------------------------------------------------------------------ Recent Labs    10/12/18 0011  TRIG 86    Lab Results  Component Value Date   HGBA1C 11.9 (H) 08/25/2015   ------------------------------------------------------------------------------------------------------------------ No results for input(s): TSH, T4TOTAL, T3FREE, THYROIDAB in the last 72 hours.  Invalid input(s): FREET3 ------------------------------------------------------------------------------------------------------------------ Recent Labs     10/13/18 0545 10/14/18 0515  FERRITIN 87 132    Coagulation profile Recent Labs  Lab 10/08/18 0636 10/09/18 0502 10/12/18 0544 10/13/18 0545 10/14/18 0515  INR 2.4* 2.7* 2.2* 1.8* 1.7*    Recent Labs    10/12/18 0011  DDIMER 0.48    Cardiac Enzymes Recent Labs  Lab 10/08/18 0001 10/12/18 0011  TROPONINI <0.03 <0.03   ------------------------------------------------------------------------------------------------------------------    Component Value Date/Time   BNP 201.2 (H) 10/12/2018 0011    Inpatient Medications  Scheduled Meds:  acetaZOLAMIDE  250 mg Per Tube BID   aspirin  81 mg Per Tube Daily   chlorhexidine gluconate (MEDLINE KIT)  15 mL Mouth Rinse BID   diltiazem  30 mg Per Tube Q8H   feeding supplement (PRO-STAT SUGAR FREE 64)  30 mL Per Tube TID   insulin aspart  0-9 Units Subcutaneous Q4H   mouth rinse  15 mL Mouth Rinse 10 times per day   metoprolol tartrate  25 mg Per Tube BID   pantoprazole (PROTONIX) IV  40 mg Intravenous QHS   phosphorus  500 mg Per Tube QID   warfarin  8 mg Oral ONCE-1800   Warfarin - Pharmacist Dosing Inpatient   Does not apply q1800   Continuous Infusions:  feeding supplement (VITAL HIGH PROTEIN) 45 mL/hr at 10/14/18 0600   fentaNYL infusion INTRAVENOUS 25 mcg/hr (10/14/18 0800)   PRN Meds:.bisacodyl, docusate, fentaNYL, Ipratropium-Albuterol, midazolam, midazolam, polyvinyl alcohol  Micro Results Recent Results (from the past 240 hour(s))  SARS Coronavirus 2 [facility] order, Performed in [place] hospital lab)     Status: None   Collection Time: 10/07/18  6:45 PM  Result Value Ref Range Status   SARS Coronavirus 2 NEGATIVE NEGATIVE Final    Comment: (NOTE) If result is NEGATIVE SARS-CoV-2 target nucleic acids are NOT DETECTED. The SARS-CoV-2 RNA is generally detectable in upper and lower  respiratory specimens during the acute phase of infection. The lowest  concentration of SARS-CoV-2 viral  copies this assay can detect is 250  copies / mL. A negative result does not preclude SARS-CoV-2 infection  and should not be used as the sole basis for treatment or other  patient management decisions.  A negative result may occur with  improper specimen collection / handling, submission of specimen other  than nasopharyngeal swab, presence of viral mutation(s) within the  areas targeted by this assay, and inadequate number of viral copies  (<250 copies / mL). A negative result must be combined with clinical  observations, patient history, and epidemiological information. If result is POSITIVE SARS-CoV-2 target nucleic acids are DETECTED. The SARS-CoV-2 RNA is generally detectable in upper and lower  respiratory specimens dur ing the acute phase of  infection.  Positive  results are indicative of active infection with SARS-CoV-2.  Clinical  correlation with patient history and other diagnostic information is  necessary to determine patient infection status.  Positive results do  not rule out bacterial infection or co-infection with other viruses. If result is PRESUMPTIVE POSTIVE SARS-CoV-2 nucleic acids MAY BE PRESENT.   A presumptive positive result was obtained on the submitted specimen  and confirmed on repeat testing.  While 2019 novel coronavirus  (SARS-CoV-2) nucleic acids may be present in the submitted sample  additional confirmatory testing may be necessary for epidemiological  and / or clinical management purposes  to differentiate between  SARS-CoV-2 and other Sarbecovirus currently known to infect humans.  If clinically indicated additional testing with an alternate test  methodology [phone]) is advised. The SARS-CoV-2 RNA is generally  detectable in upper and lower respiratory sp ecimens during the acute  phase of infection. The expected result is Negative. Fact Sheet for Patients:  StrictlyIdeas.no Fact Sheet for Healthcare  Providers: BankingDealers.co.za This test is not yet approved or cleared by the [place] FDA and has been authorized for detection and/or diagnosis of SARS-CoV-2 by FDA under an Emergency Use Authorization (EUA).  This EUA will remain in effect (meaning this test can be used) for the duration of the COVID-19 declaration under Section 564(b)(1) of the Act, 21 U.S.C. section 360bbb-3(b)(1), unless the authorization is terminated or revoked sooner. Performed at [facility], [place] [address] [place], [place] [address]   Novel Coronavirus, NAA (hospital order; send-out to ref lab)     Status: None   Collection Time: 10/07/18  6:45 PM  Result Value Ref Range Status   SARS-CoV-2, NAA NOT DETECTED NOT DETECTED Final    Comment: (NOTE) This test was developed and its performance characteristics determined by [employer]. This test has not been FDA cleared or approved. This test has been authorized by FDA under an Emergency Use Authorization (EUA). This test has been validated in accordance with the FDA's Guidance Document (Policy for [place] in Laboratories Certified to Perform High Complexity Testing under CLIA prior to Emergency Use Authorization for Coronavirus [id number] during the [facility] Emergency) issued on February 29th, 2020. FDA independent review of this validation is pending. This test is only authorized for the duration of time the declaration that circumstances exist justifying the authorization of the emergency use of in vitro diagnostic tests for detection of SARS-CoV- 2 virus and/or diagnosis of COVID-19 infection under section 564(b)(1) of the Act, 21 U.S.C. 656CLE-7(N)(1), unless the authorization is terminated or revoked sooner. Performed At: [facility] [address] [place], [place] [id number] Rush Farmer MD HQ:[id number]    [place]  Final    Comment: Performed at  [facility], [employer] [address], [place], [place] [address]  Respiratory Panel by PCR     Status: None   Collection Time: 10/07/18  6:45 PM  Result Value Ref Range Status   Adenovirus NOT DETECTED NOT DETECTED Final   Coronavirus 229E NOT DETECTED NOT DETECTED Final    Comment: (NOTE) The Coronavirus on the Respiratory Panel, DOES NOT test for the novel  Coronavirus (2019 nCoV)    Coronavirus HKU1 NOT DETECTED NOT DETECTED Final   Coronavirus NL63 NOT DETECTED NOT DETECTED Final   Coronavirus OC43 NOT DETECTED NOT DETECTED Final   Metapneumovirus NOT DETECTED NOT DETECTED Final   Rhinovirus / Enterovirus NOT DETECTED NOT DETECTED Final   Influenza A NOT DETECTED  NOT DETECTED Final   Influenza B NOT DETECTED NOT DETECTED Final   Parainfluenza Virus 1 NOT DETECTED NOT DETECTED Final   Parainfluenza Virus 2 NOT DETECTED NOT DETECTED Final   Parainfluenza Virus 3 NOT DETECTED NOT DETECTED Final   Parainfluenza Virus 4 NOT DETECTED NOT DETECTED Final   Respiratory Syncytial Virus NOT DETECTED NOT DETECTED Final   Bordetella pertussis NOT DETECTED NOT DETECTED Final   Chlamydophila pneumoniae NOT DETECTED NOT DETECTED Final   Mycoplasma pneumoniae NOT DETECTED NOT DETECTED Final    Comment: Performed at [facility], [place] [address], [place], [place] [address]  Culture, blood (routine x 2) Call MD if unable to obtain prior to antibiotics being given     Status: Abnormal   Collection Time: 10/07/18 11:45 PM  Result Value Ref Range Status   Specimen Description BLOOD RIGHT ARM  Final   Special Requests   Final    BOTTLES DRAWN AEROBIC AND ANAEROBIC Blood Culture results may not be optimal due to an excessive volume of blood received in culture bottles   Culture  Setup Time   Final    GRAM POSITIVE COCCI AEROBIC BOTTLE ONLY CRITICAL RESULT CALLED TO, READ BACK BY AND VERIFIED WITH: PHRMD J ORIET '@0354'  10/09/18 BY S GEZAHEGN    Culture (A)  Final    STAPHYLOCOCCUS SPECIES  (COAGULASE NEGATIVE) THE SIGNIFICANCE OF ISOLATING THIS ORGANISM FROM A SINGLE VENIPUNCTURE CANNOT BE PREDICTED WITHOUT FURTHER CLINICAL AND CULTURE CORRELATION. SUSCEPTIBILITIES AVAILABLE ONLY ON REQUEST. Performed at [facility], [place] [address] [place], [place] [address]    Report Status 10/29/2018 FINAL  Final  Blood Culture ID Panel (Reflexed)     Status: Abnormal   Collection Time: 10/07/18 11:45 PM  Result Value Ref Range Status   Enterococcus species NOT DETECTED NOT DETECTED Final   Listeria monocytogenes NOT DETECTED NOT DETECTED Final   Staphylococcus species DETECTED (A) NOT DETECTED Final    Comment: Methicillin (oxacillin) resistant coagulase negative staphylococcus. Possible blood culture contaminant (unless isolated from more than one blood culture draw or clinical case suggests pathogenicity). No antibiotic treatment is indicated for blood  culture contaminants. CRITICAL RESULT CALLED TO, READ BACK BY AND VERIFIED WITH: PHRMD J ORIET '@0354'  10/09/18 BY S GEZAHEGN    Staphylococcus aureus (BCID) NOT DETECTED NOT DETECTED Final   Methicillin resistance DETECTED (A) NOT DETECTED Final    Comment: CRITICAL RESULT CALLED TO, READ BACK BY AND VERIFIED WITH: PHRMD J ORIET '@0354'  10/09/18 BY S GEZAHEGN    Streptococcus species NOT DETECTED NOT DETECTED Final   Streptococcus agalactiae NOT DETECTED NOT DETECTED Final   Streptococcus pneumoniae NOT DETECTED NOT DETECTED Final   Streptococcus pyogenes NOT DETECTED NOT DETECTED Final   Acinetobacter baumannii NOT DETECTED NOT DETECTED Final   Enterobacteriaceae species NOT DETECTED NOT DETECTED Final   Enterobacter cloacae complex NOT DETECTED NOT DETECTED Final   Escherichia coli NOT DETECTED NOT DETECTED Final   Klebsiella oxytoca NOT DETECTED NOT DETECTED Final   Klebsiella pneumoniae NOT DETECTED NOT DETECTED Final   Proteus species NOT DETECTED NOT DETECTED Final   Serratia marcescens NOT DETECTED NOT DETECTED Final    Haemophilus influenzae NOT DETECTED NOT DETECTED Final   Neisseria meningitidis NOT DETECTED NOT DETECTED Final   Pseudomonas aeruginosa NOT DETECTED NOT DETECTED Final   Candida albicans NOT DETECTED NOT DETECTED Final   Candida glabrata NOT DETECTED NOT DETECTED Final   Candida krusei NOT DETECTED NOT DETECTED Final   Candida parapsilosis  NOT DETECTED NOT DETECTED Final   Candida tropicalis NOT DETECTED NOT DETECTED Final    Comment: Performed at [facility], [place] [address] [place], [place] [address]  Culture, blood (routine x 2) Call MD if unable to obtain prior to antibiotics being given     Status: None   Collection Time: 10/08/18 12:08 AM  Result Value Ref Range Status   Specimen Description BLOOD RIGHT HAND  Final   Special Requests   Final    BOTTLES DRAWN AEROBIC ONLY Blood Culture adequate volume   Culture   Final    NO GROWTH 5 DAYS Performed at [facility], [place] [address] [place], [place] [address]    Report Status 10/13/2018 FINAL  Final  MRSA PCR Screening     Status: None   Collection Time: 10/08/18  1:06 AM  Result Value Ref Range Status   MRSA by PCR NEGATIVE NEGATIVE Final    Comment:        The GeneXpert MRSA Assay (FDA approved for NASAL specimens only), is one component of a comprehensive MRSA colonization surveillance program. It is not intended to diagnose MRSA infection nor to guide or monitor treatment for MRSA infections. Performed at [facility], [place] [address] [place], [place] [address]   Urine culture     Status: Abnormal   Collection Time: 10/08/18  1:34 PM  Result Value Ref Range Status   Specimen Description URINE, CLEAN CATCH  Final   Special Requests   Final    Immunocompromised Performed at [facility], [place] [address], [place], [place] [address]    Culture MULTIPLE SPECIES PRESENT, SUGGEST RECOLLECTION (A)  Final   Report Status 10/09/2018 FINAL  Final  SARS Coronavirus 2 [facility] order, Performed in [place] hospital lab)     Status: Abnormal   Collection Time: 10/12/18 12:11 AM  Result Value Ref Range Status   SARS Coronavirus 2 POSITIVE (A) NEGATIVE Final    Comment: RESULT CALLED TO, READ BACK BY AND VERIFIED WITH: K MUNNET '@[id number]'  10/12/18 BY S GEZAHEGN (NOTE) If result is NEGATIVE SARS-CoV-2 target nucleic acids are NOT DETECTED. The SARS-CoV-2 RNA is generally detectable in upper and lower  respiratory specimens during the acute phase of infection. The lowest  concentration of SARS-CoV-2 viral copies this assay can detect is 250  copies / mL. A negative result does not preclude SARS-CoV-2 infection  and should not be used as the sole basis for treatment or other  patient management decisions.  A negative result may occur with  improper specimen collection / handling, submission of specimen other  than nasopharyngeal swab, presence of viral mutation(s) within the  areas targeted by this assay, and inadequate number of viral copies  (<250 copies / mL). A negative result must be combined with clinical  observations, patient history, and epidemiological information. If result is POSITIVE SARS-CoV-2 target nucleic acids are DETECTED. T he SARS-CoV-2 RNA is generally detectable in upper and lower  respiratory specimens during the acute phase of infection.  Positive  results are indicative of active infection with SARS-CoV-2.  Clinical  correlation with patient history and other diagnostic information is  necessary to determine patient infection status.  Positive results do  not rule out bacterial infection or co-infection with other viruses. If result is PRESUMPTIVE POSTIVE SARS-CoV-2 nucleic acids MAY BE PRESENT.   A presumptive positive result was obtained on the submitted specimen  and confirmed on repeat testing.  While  2019 novel coronavirus  (SARS-CoV-2) nucleic acids may be present in the submitted sample  additional confirmatory testing may be necessary for epidemiological   and / or clinical management purposes  to differentiate between  SARS-CoV-2 and other Sarbecovirus currently known to infect humans.  If clinically indicated additional testing with an alternate test  methodology [phone]) is  advised. The SARS-CoV-2 RNA is generally  detectable in upper and lower respiratory specimens during the acute  phase of infection. The expected result is Negative. Fact Sheet for Patients:  StrictlyIdeas.no Fact Sheet for Healthcare Providers: BankingDealers.co.za This test is not yet approved or cleared by the [place] FDA and has been authorized for detection and/or diagnosis of SARS-CoV-2 by FDA under an Emergency Use Authorization (EUA).  This EUA will remain in effect (meaning this test can be used) for the duration of the COVID-19 declaration under Section 564(b)(1) of the Act, 21 U.S.C. section 360bbb-3(b)(1), unless the authorization is terminated or revoked sooner. Performed at [facility], [place] [address], [place], [place] [address]   Blood Culture (routine x 2)     Status: None (Preliminary result)   Collection Time: 10/12/18 12:11 AM  Result Value Ref Range Status   Specimen Description BLOOD RIGHT HAND  Final   Special Requests   Final    BOTTLES DRAWN AEROBIC AND ANAEROBIC Blood Culture results may not be optimal due to an inadequate volume of blood received in culture bottles   Culture   Final    NO GROWTH 2 DAYS Performed at [facility], [place] [address] [place], [place] [address]    Report Status PENDING  Incomplete  Blood Culture (routine x 2)     Status: None (Preliminary result)   Collection Time: 10/12/18 12:32 AM  Result Value Ref Range Status   Specimen Description BLOOD SITE NOT SPECIFIED  Final   Special Requests   Final    BOTTLES DRAWN AEROBIC AND ANAEROBIC Blood Culture results may not be optimal due to an inadequate volume of blood received in culture bottles    Culture   Final    NO GROWTH 2 DAYS Performed at [facility], [place] [address], [place], [place] [address]    Report Status PENDING  Incomplete  Culture, respiratory (non-expectorated)     Status: None   Collection Time: 10/12/18  8:15 AM  Result Value Ref Range Status   Specimen Description TRACHEAL ASPIRATE  Final   Special Requests NONE  Final   Gram Stain   Final    RARE WBC PRESENT, PREDOMINANTLY PMN NO ORGANISMS SEEN    Culture   Final    RARE Consistent with normal respiratory flora. Performed at [facility], [place] [address] [place], [place] [address]    Report Status 10/14/2018 FINAL  Final    Radiology Reports Dg Abd 1 View  Result Date: 10/12/2018 CLINICAL DATA:  Enteric tube placement. EXAM: ABDOMEN - 1 VIEW COMPARISON:  CT abdomen pelvis dated May 08, 2014. FINDINGS: Enteric tube tip in the stomach. The visualized bowel gas pattern is normal. No radio-opaque calculi or other significant radiographic abnormality are seen. No acute osseous abnormality. IMPRESSION: 1. Enteric tube in the stomach. Electronically Signed   By: Titus Dubin M.D.   On: 10/12/2018 06:24   Ct Head Wo Contrast  Result Date: 10/07/2018 CLINICAL DATA:  Seizure.  Bilateral hand twitching EXAM: CT HEAD WITHOUT CONTRAST TECHNIQUE: Contiguous axial images were obtained from the base of the  skull through the vertex without intravenous contrast. COMPARISON:  None. FINDINGS: Brain: Ventricle size and cerebral volume normal. Negative for acute infarct. Negative for hemorrhage or mass. Vascular: Negative for hyperdense vessel Skull: Negative Sinuses/Orbits: Bilateral exophthalmos. No orbital mass. Paranasal sinuses clear. Other: None IMPRESSION: No acute intracranial abnormality Exophthalmos. Electronically Signed   By: Franchot Gallo M.D.   On: 10/07/2018 17:10   Dg Chest Port 1 View  Result Date: 10/13/2018 CLINICAL DATA:  Intubation EXAM: PORTABLE CHEST 1 VIEW COMPARISON:  Yesterday  FINDINGS: Endotracheal tube tip between the clavicular heads and carina. The orogastric tube at least reaches the stomach. Cardiomegaly and vascular pedicle widening. Low lung volumes with diffuse pulmonary opacification that is similar to prior. No air leak or definite/increasing pleural fluid. IMPRESSION: Unchanged hardware positioning and bilateral airspace disease. Electronically Signed   By: Monte Fantasia M.D.   On: 10/13/2018 08:04   Dg Chest Port 1 View  Result Date: 10/12/2018 CLINICAL DATA:  Endotracheal tube placement. EXAM: PORTABLE CHEST 1 VIEW COMPARISON:  Chest x-ray from same day at 3:36 a.m. FINDINGS: Endotracheal tube in place with the tip now 8 mm above the carina. Enteric tube in the stomach. Stable cardiomegaly. Persistent low lung volumes with moderate pulmonary edema and small bilateral pleural effusions. Slightly improved aeration at the right lung base. Persistent dense retrocardiac opacification, likely atelectasis. No pneumothorax. No acute osseous abnormality. IMPRESSION: 1. Endotracheal tube tip now 8 mm above the carina. Consider retracting an additional 2 cm. 2. Unchanged congestive heart failure. Slightly improved aeration at the right lung base. Electronically Signed   By: Titus Dubin M.D.   On: 10/12/2018 06:28   Dg Chest Port 1 View  Result Date: 10/12/2018 CLINICAL DATA:  Follow-up examination status post endotracheal tube repositioning. EXAM: PORTABLE CHEST 1 VIEW COMPARISON:  Prior radiograph earlier same day. FINDINGS: Tip of the endotracheal tube still remains deep within the right mainstem bronchus. Retraction by approximately 3.5 cm recommended. Remainder of the examination remains unchanged. IMPRESSION: Tip of endotracheal tube remains deep within the right mainstem bronchus. Retraction by approximately 3-3.5 cm recommended. Critical Value/emergent results were called by telephone at the time of interpretation on 10/12/2018 at 4:51 am to Dr. Delora Fuel , who  verbally acknowledged these results. Electronically Signed   By: Jeannine Boga M.D.   On: 10/12/2018 04:51   Dg Chest Port 1 View  Result Date: 10/12/2018 CLINICAL DATA:  Initial evaluation for intubation. EXAM: PORTABLE CHEST 1 VIEW COMPARISON:  Prior radiograph from earlier the same day. FINDINGS: Endotracheal tube in place, position deep with tip in the right mainstem bronchus. At time of this dictation, a second film has been obtained. Tip still appears to be within the right mainstem bronchus. Enteric tube courses into the abdomen. Stable cardiomegaly. Low lung volumes. Pulmonary edema with bilateral pleural effusions again seen, similar to previous. Probable superimposed bibasilar atelectasis. No pneumothorax. Osseous structures unchanged. IMPRESSION: 1. Tip of endotracheal tube positioned within the right mainstem bronchus. At time of this dictation, a second filled with a repositioned tube has already been acquired. 2. Diffuse pulmonary edema and small bilateral pleural effusions, suggesting CHF. 3. Probable superimposed bibasilar atelectasis. 4. Electronically Signed   By: Jeannine Boga M.D.   On: 10/12/2018 04:47   Dg Chest Port 1 View  Result Date: 10/12/2018 CLINICAL DATA:  Altered mental status, shortness of breath EXAM: PORTABLE CHEST 1 VIEW COMPARISON:  10/07/2018 FINDINGS: Cardiomegaly. Diffuse bilateral airspace disease, favor edema/CHF. Suspect layering effusions. Very low  lung volumes. No acute bony abnormality. IMPRESSION: Cardiomegaly with severe diffuse bilateral airspace disease and suspected layering effusions, likely edema/CHF. Low lung volumes. Electronically Signed   By: Rolm Baptise M.D.   On: 10/12/2018 01:01   Dg Chest Portable 1 View  Result Date: 10/07/2018 CLINICAL DATA:  Tremor.  Cough. EXAM: PORTABLE CHEST 1 VIEW COMPARISON:  04/14/2018 and 05/17/2008 FINDINGS: There is abnormal increased density at the left lung base, new since the prior study. Heart size  and pulmonary vascularity are normal. There is a new small focal area of linear atelectasis in the right upper lobe. No effusions. IMPRESSION: Area of new infiltrate at the left lung base. Minimal atelectasis in the right upper lobe. Electronically Signed   By: Lorriane Shire M.D.   On: 10/07/2018 17:14    Phillips Climes M.D on 10/14/2018 at 9:31 AM  Between 7am to 7pm - Pager - [phone]  After 7pm go to [email or web address] - password [facility]  [facility] -  Office  [phone]

## 2018-10-14 NOTE — Procedures (Signed)
Intubation Procedure Note Emily Fry [id number] 1955/09/03  Procedure: Intubation Indications: Airway protection and maintenance  Procedure Details Consent: Risks of procedure as well as the alternatives and risks of each were explained to the (patient/caregiver).  Consent for procedure obtained. Time Out: Verified patient identification, verified procedure, site/side was marked, verified correct patient position, special equipment/implants available, medications/allergies/relevent history reviewed, required imaging and test results available.  Performed  Maximum sterile technique was used including cap, gloves, gown, hand hygiene and mask.  First attempt with MAC blade 3. Difficult airway with grade 4 view Reattempted with glide scope with successful intubation. Sats fell transiently to 30% for < 1 min and recovered after successful intubation.   Evaluation Hemodynamic Status: Transient hypotension treated with fluid; O2 sats: transiently fell during during procedure Patient's Current Condition: stable Complications: No apparent complications Patient did tolerate procedure well. Chest X-ray ordered to verify placement.  CXR: pending.   Emily Fry 10/14/2018

## 2018-10-14 NOTE — Progress Notes (Signed)
Extubated patient to 100% NRB.  Patient had weaned for over an hour, alert, following all commands and had an audible cuff leak prior to extubation.  Tolerated well. Good, strong cough and verbalizing her name and asking for a blanket.  Will continue to monitor.  Sat 100% and Dr Sherrie George present at bedside during extubation.

## 2018-10-14 NOTE — Progress Notes (Signed)
ANTICOAGULATION CONSULT NOTE - Follow Up Consult  Pharmacy Consult for Warfarin Indication: atrial fibrillation  Allergies  Allergen Reactions  . Contrast Media [Iodinated Diagnostic Agents] Swelling  . Tomato Other (See Comments)    unknow per MAR  . Amoxicillin Diarrhea, Itching and Other (See Comments)    Has patient had a PCN reaction causing immediate rash, facial/tongue/throat swelling, SOB or lightheadedness with hypotension: No Has patient had a PCN reaction causing severe rash involving mucus membranes or skin necrosis: No Has patient had a PCN reaction that required hospitalization: No Has patient had a PCN reaction occurring within the last 10 years: Yes If all of the above answers are "NO", then may proceed with Cephalosporin use.     Patient Measurements: Height: _0  (162.6 cm) Weight: (!) 330 lb 14.4 oz (150.1 kg) IBW/kg (Calculated) : 54.7  Vital Signs: Temp: 99.4 F (37.4 C) (05/05 0800) Temp Source: Axillary (05/05 0800) BP: 126/62 (05/05 0800) Pulse Rate: 111 (05/05 0800)  Labs: Recent Labs    10/12/18 0011 10/12/18 0433 10/12/18 0544  10/13/18 0352 10/13/18 0545 10/13/18 1530 10/14/18 0515  HGB 11.5* 11.4*  --    < > 10.9* 10.7*  --  11.1*  HCT 41.7 40.9  --    < > 32.0* 36.4  --  41.5  PLT 171 164  --   --   --  166  --  148*  LABPROT  --   --  23.7*  --   --  20.7*  --  20.1*  INR  --   --  2.2*  --   --  1.8*  --  1.7*  CREATININE 1.27*  --   --   --   --  1.12* 1.10* 1.35*  TROPONINI <0.03  --   --   --   --   --   --   --    < > = values in this interval not displayed.    Estimated Creatinine Clearance: 65 mL/min (A) (by C-G formula based on SCr of 1.35 mg/dL (H)).   Medications:  Scheduled:  . acetaZOLAMIDE  250 mg Per Tube BID  . aspirin  81 mg Per Tube Daily  . chlorhexidine gluconate (MEDLINE KIT)  15 mL Mouth Rinse BID  . diltiazem  30 mg Per Tube Q8H  . feeding supplement (PRO-STAT SUGAR FREE 64)  30 mL Per Tube TID  .  insulin aspart  0-9 Units Subcutaneous Q4H  . mouth rinse  15 mL Mouth Rinse 10 times per day  . metoprolol tartrate  25 mg Per Tube BID  . pantoprazole (PROTONIX) IV  40 mg Intravenous QHS  . phosphorus  500 mg Per Tube QID  . Warfarin - Pharmacist Dosing Inpatient   Does not apply q1800    Assessment: 42 YOF with h/o Afib on warfarin admitted to ICU with hypercarbic respiratory failure, +COVID-19.  Pharmacy consulted to resume warfarin. INR on admission is therapeutic at 2.2.  Home warfarin regimen: 6 mg on Mon/Wed/Fri; 5 mg on other days   Today, 10/14/2018:  INR decreased to 1.7, subtherapeutic - Note missed dose on 5/2 (last home dose on 5/1, admitted 5/3) CBC: Hgb up to 11.1, Plt decreased to 148 No bleeding or complications reported. No major drug-drug interactions noted.  Pt continues on Aspirin 90m daily.   Goal of Therapy:  INR 2-3 Monitor platelets by anticoagulation protocol: Yes   Plan:  Warfarin 8 mg PO x1 at 18:00. Daily PT/INR. Monitor  for signs and symptoms of bleeding.   Gretta Arab PharmD, BCPS 10/14/2018 8:51 AM

## 2018-10-14 NOTE — Plan of Care (Signed)

## 2018-10-14 NOTE — Progress Notes (Signed)
NAME:  Emily GunnerJanet Thomley, MRN:  [id number], DOB:  1956-01-02, LOS: 2 ADMISSION DATE:  10/15/2018, CONSULTATION DATE:  10/12/2018 REFERRING MD:  Dr. Preston FleetingGlick, CHIEF COMPLAINT:  SOB/ hypoxic  Brief History   63 year old female from [facility] presenting with increasing SOB day and hypoxia since yesterday.  Severe bilateral airspace disease. COVID positive. Intubated.  During intubation, a tooth was dislodged with temporary bleeding in which has since abated.  PCCM called for admit.   Recent hospitalization 4/28- 4/30 acute on chronic hypercarbic/ hypoxic respiratory failure with two negative COVID test.   Past Medical History  COPD, OSA, chronic hypoxemic/ hypercapneic respiratory failure on baseline 2L , A-fib, Allergic rhinitis, Bilateral primary osteoarthritis of knee, Diastolic heart failure, GERD (gastroesophageal reflux disease), Gout, Hypertension, morbid obesity  Significant Hospital Events   5/2 Admitted   Consults:   Procedures:  5/3 ETT >>  Significant Diagnostic Tests:  5/2 CXR >> Cardiomegaly with severe diffuse bilateral airspace disease and suspected layering effusions, likely edema/CHF. Low lung volumes.  Micro Data:  5/3 BCx 2 >> 5/3 COVID 19 >> POSITIVE 5/3 trach asp >>  Antimicrobials:   Interim history/subjective:  Weaning on pressure support. Awake, follow commands  Objective   Blood pressure (!) 103/38, pulse 99, temperature 99.5 F (37.5 C), resp. rate 13, height 5\' 4"  (1.626 m), weight (!) 150.1 kg, SpO2 98 %.    Vent Mode: PRVC FiO2 (%):  [50 %] 50 % Set Rate:  [24 bmp] 24 bmp Vt Set:  [330 mL] 330 mL PEEP:  [5 cmH20-8 cmH20] 8 cmH20 Pressure Support:  [10 cmH20] 10 cmH20 Plateau Pressure:  [21 cmH20-31 cmH20] 21 cmH20   Intake/Output Summary (Last 24 hours) at 10/14/2018 [id number] Last data filed at 10/14/2018 0700 Gross per 24 hour  Intake 1194.45 ml  Output 1405 ml  Net -210.55 ml   Filed Weights   10/12/18 0007 10/12/18 0531 10/13/18 0500   Weight: (!) 150.9 kg (!) 156.9 kg (!) 150.1 kg   Examination: Gen:      Obese HEENT:  EOMI, sclera anicteric Neck:     No masses; no thyromegaly, ETT Lungs:    Clear to auscultation bilaterally; normal respiratory effort CV:         Regular rate and rhythm; no murmurs Abd:      + bowel sounds; soft, non-tender; no palpable masses, no distension Ext:    No edema; adequate peripheral perfusion Skin:      Warm and dry; no rash Neuro: alert and oriented x 3  Resolved Hospital Problem list    Assessment & Plan:  Acute on chronic hypoxic and hypercarbic respiratory failure  COVID 19 COPD OSA R/o HCAP P:  Given rapid improvement with diuresis suspect CHF as major cause of respiratory decompensation Tolerating Weaning. Plan for extubation today Hold lasix as Cr is higher Continue nebs. Holding antibiotics.  Best practice:  Diet: NPO Pain/Anxiety/Delirium protocol (if indicated): fentanyl gtt/ versed VAP protocol (if indicated): yes DVT prophylaxis:Coumadin GI prophylaxis: PPI Glucose control: SSI Mobility: BR Code Status: full  Family Communication: patients sister (only [facility]) Annice PihJackie [phone] called and updated.  Disposition: ICU  Labs   CBC: Recent Labs  Lab 10/07/18 1642  10/08/18 0636 10/09/18 0803 10/12/18 0011 10/12/18 [id number]  10/12/18 1124 10/12/18 1941 10/13/18 0352 10/13/18 0545 10/14/18 0515  WBC 8.6  --  6.8 5.4 7.1 5.5  --   --   --   --  5.6 6.4  NEUTROABS 6.8  --  5.0  --  5.7  --   --   --   --   --   --   --   HGB 12.0   < > 10.9* 10.8* 11.5* 11.4*   < > 10.9* 10.9* 10.9* 10.7* 11.1*  HCT 41.8   < > 38.9 37.0 41.7 40.9   < > 32.0* 32.0* 32.0* 36.4 41.5  MCV 101.5*  --  99.7 97.1 102.5* 101.2*  --   --   --   --  95.5 103.0*  PLT 184  --  149* 159 171 164  --   --   --   --  166 148*   < > = values in this interval not displayed.    Basic Metabolic Panel: Recent Labs  Lab 10/07/18 1642  10/09/18 0803 10/12/18 0011  10/12/18 1142 10/12/18  1700 10/12/18 1941 10/13/18 0352 10/13/18 0545 10/13/18 1530 10/14/18 0515  NA 144   < > 141 141   < >  --   --  140 141 142 144 146*  K 4.5   < > 3.8 4.6   < >  --   --  3.1* 2.7* 2.7* 3.9 3.9  CL 101   < > 99 98  --   --   --   --   --  100 100 101  CO2 35*   < > 37* 34*  --   --   --   --   --  34* 34* 35*  GLUCOSE 140*   < > 100* 129*  --   --   --   --   --  112* 135* 117*  BUN 28*   < > 24* 25*  --   --   --   --   --  44* 48* 64*  CREATININE 1.18*   < > 0.99 1.27*  --   --   --   --   --  1.12* 1.10* 1.35*  CALCIUM 9.4   < > 8.8* 9.1  --   --   --   --   --  8.8* 8.8* 8.3*  MG 1.9  --   --   --   --  2.7* 2.1  --   --  2.0 2.1  --   PHOS  --   --   --   --   --  1.8* 1.2*  --   --  2.1*  --   --    < > = values in this interval not displayed.   GFR: Estimated Creatinine Clearance: 65 mL/min (A) (by C-G formula based on SCr of 1.35 mg/dL (H)). Recent Labs  Lab 10/08/18 0001 10/08/18 0636 10/09/18 0502  10/12/18 0011 10/12/18 0433 10/13/18 0545 10/14/18 0515  PROCALCITON <0.10 <0.10 0.16  --  0.16  --   --   --   WBC  --  6.8  --    < > 7.1 5.5 5.6 6.4  LATICACIDVEN  --   --   --   --  0.5 0.9  --   --    < > = values in this interval not displayed.    Liver Function Tests: Recent Labs  Lab 10/07/18 1642 10/08/18 0636 10/12/18 0011  AST 20 18 39  ALT 20 17 33  ALKPHOS 141* 119 117  BILITOT 0.4 0.6 0.6  PROT 6.6 5.9* 6.7  ALBUMIN 3.1* 2.8* 3.1*   No results for input(s): LIPASE, AMYLASE in  the last 168 hours. Recent Labs  Lab 10/08/18 0002  AMMONIA 38*    ABG    Component Value Date/Time   PHART 7.524 (H) 10/13/2018 0352   PCO2ART 44.4 10/13/2018 0352   PO2ART 82.0 (L) 10/13/2018 0352   HCO3 36.6 (H) 10/13/2018 0352   TCO2 38 (H) 10/13/2018 0352   O2SAT 97.0 10/13/2018 0352     Coagulation Profile: Recent Labs  Lab 10/08/18 0636 10/09/18 0502 10/12/18 0544 10/13/18 0545 10/14/18 0515  INR 2.4* 2.7* 2.2* 1.8* 1.7*    Cardiac Enzymes:  Recent Labs  Lab 10/08/18 0001 10/12/18 0011  TROPONINI <0.03 <0.03    HbA1C: Hgb A1c MFr Bld  Date/Time Value Ref Range Status  08/25/2015 02:14 AM 11.9 (H) 4.0 - 6.0 % Final    CBG: Recent Labs  Lab 10/13/18 1147 10/13/18 1533 10/13/18 1950 10/14/18 0017 10/14/18 0348  GLUCAP 135* 127* 121* 138* 117*   The patient is critically ill with multiple organ system failure and requires high complexity decision making for assessment and support, frequent evaluation and titration of therapies, advanced monitoring, review of radiographic studies and interpretation of complex data.   Critical Care Time devoted to patient care services, exclusive of separately billable procedures, described in this note is 35 minutes.   Chilton Greathouse MD [facility] Pager [phone] If no answer call [phone] 10/14/2018, 7:22 AM

## 2018-10-14 NOTE — Progress Notes (Signed)
PCCM Progress notes  Called to the bedside for altered mental status ABG shows 7.16/PCO2 > 97/PO2 41  Intubated for hypoxic, hypercarbic respiratory failure Family informed over the phone.  They want her to remain full code.  Chilton Greathouse MD [facility] 10/14/2018, 1:25 PM

## 2018-10-14 NOTE — TOC Initial Note (Signed)
Transition of Care [facility]) - Initial/Assessment Note    Patient Details  Name: Emily Fry MRN: [id number] Date of Birth: 08-20-1955  [facility] CM/SW Contact:    Gildardo Griffes, LCSW Phone Number: 10/14/2018, 1:21 PM  Clinical Narrative:                  CSW consulted with patient's sister Annice Pih who reports she is point of contact for patient updates. Annice Pih informed CSW patient is from [facility], she reports patient was in [place] since November 2019, however was recently moved to [facility] a few weeks ago for COVID preparations. Annice Pih in agreement for patient to return to SNF at [facility] when medically ready, and wrote down CSW contact information for any needs that may arise. CSW will continue to follow for discharge planning back to [facility]. No negative tests required at this time, patient can return to [facility] when medically cleared.   Expected Discharge Plan: Skilled Nursing Facility Barriers to Discharge: Continued Medical Work up   Patient Goals and CMS Choice   CMS Medicare.gov Compare Post Acute Care list provided to:: Patient Represenative (must comment)(Jackie (sister)) Choice offered to / list presented to : Sibling  Expected Discharge Plan and Services Expected Discharge Plan: Skilled Nursing Facility In-house Referral: NA   Post Acute Care Choice: Skilled Nursing Facility Living arrangements for the past 2 months: Skilled Nursing Facility                 DME Arranged: N/A DME Agency: NA       HH Arranged: NA HH Agency: NA        Prior Living Arrangements/Services Living arrangements for the past 2 months: Skilled Nursing Facility Lives with:: Self Patient language and need for interpreter reviewed:: Yes Do you feel safe going back to the place where you live?: Yes      Need for Family Participation in Patient Care: Yes (Comment) Care giver support system in place?: Yes (comment)   Criminal Activity/Legal Involvement  Pertinent to Current Situation/Hospitalization: No - Comment as needed  Activities of Daily Living      Permission Sought/Granted Permission sought to share information with : Case Manager, Magazine features editor, Family Supports Permission granted to share information with : Yes, Verbal Permission Granted  Share Information with NAME: Annice Pih  Permission granted to share info w AGENCY: SNFs  Permission granted to share info w Relationship: sister  Permission granted to share info w Contact Information: [phone]  Emotional Assessment Appearance:: Appears stated age Attitude/Demeanor/Rapport: Unable to Assess Affect (typically observed): Unable to Assess Orientation: : Oriented to Self, Oriented to  Time, Oriented to Place, Oriented to Situation Alcohol / Substance Use: Not Applicable Psych Involvement: No (comment)  Admission diagnosis:  Morbid obesity (HCC) [E66.01] Acute on chronic diastolic heart failure (HCC) [I50.33] Macrocytic anemia [D53.9] Acute on chronic respiratory failure with hypoxia and hypercapnia (HCC) [J96.21, J96.22] Chronic obstructive pulmonary disease, unspecified COPD type (HCC) [J44.9] Real time reverse transcriptase PCR positive for COVID-19 virus [U07.1] Patient Active Problem List   Diagnosis Date Noted  . Acute on chronic diastolic heart failure (HCC) 10/14/2018  . Morbid obesity (HCC) 10/14/2018  . Acute respiratory failure with hypoxia (HCC) 10/12/2018  . HCAP (healthcare-associated pneumonia)   . Altered mental state 10/07/2018  . A-fib (HCC) 10/07/2018  . COPD (chronic obstructive pulmonary disease) (HCC) 10/07/2018  . Gout 10/07/2018  . Benign essential HTN 10/07/2018  . OSA (obstructive sleep apnea) 10/07/2018  .  Diastolic heart failure (HCC) 10/07/2018  . GERD (gastroesophageal reflux disease) 10/07/2018  . Hypoxia 04/14/2018  . Hyponatremia 08/28/2015  . Hypokalemia 08/28/2015  . Type 2 diabetes mellitus with hyperosmolar  nonketotic hyperglycemia (HCC) 08/25/2015  . 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

## 2018-10-15 ENCOUNTER — Inpatient Hospital Stay (HOSPITAL_COMMUNITY): Payer: Medicare Other

## 2018-10-15 DIAGNOSIS — J9622 Acute and chronic respiratory failure with hypercapnia: Secondary | ICD-10-CM

## 2018-10-15 DIAGNOSIS — J9621 Acute and chronic respiratory failure with hypoxia: Secondary | ICD-10-CM

## 2018-10-15 LAB — BASIC METABOLIC PANEL
Anion gap: 8 (ref 5–15)
BUN: 70 mg/dL — ABNORMAL HIGH (ref 6–20)
CO2: 36 mmol/L — ABNORMAL HIGH (ref 22–32)
Calcium: 8 mg/dL — ABNORMAL LOW (ref 8.9–10.3)
Chloride: 105 mmol/L (ref 98–111)
Creatinine, Ser: 1.25 mg/dL — ABNORMAL HIGH (ref 0.44–1.00)
GFR calc Af Amer: 54 mL/min — ABNORMAL LOW (ref >60)
GFR calc non Af Amer: 47 mL/min — ABNORMAL LOW (ref >60)
Glucose, Bld: 137 mg/dL — ABNORMAL HIGH (ref 70–99)
Potassium: 2.9 mmol/L — ABNORMAL LOW (ref 3.5–5.1)
Sodium: 149 mmol/L — ABNORMAL HIGH (ref 135–145)

## 2018-10-15 LAB — PHOSPHORUS: Phosphorus: 3.1 mg/dL (ref 2.5–4.6)

## 2018-10-15 LAB — CBC
HCT: 35 % — ABNORMAL LOW (ref 36.0–46.0)
Hemoglobin: 10 g/dL — ABNORMAL LOW (ref 12.0–15.0)
MCH: 28.7 pg (ref 26.0–34.0)
MCHC: 28.6 g/dL — ABNORMAL LOW (ref 30.0–36.0)
MCV: 100.3 fL — ABNORMAL HIGH (ref 80.0–100.0)
Platelets: 120 10*3/uL — ABNORMAL LOW (ref 150–400)
RBC: 3.49 MIL/uL — ABNORMAL LOW (ref 3.87–5.11)
RDW: 15.8 % — ABNORMAL HIGH (ref 11.5–15.5)
WBC: 4 10*3/uL (ref 4.0–10.5)
nRBC: 0.7 % — ABNORMAL HIGH (ref 0.0–0.2)

## 2018-10-15 LAB — MAGNESIUM: Magnesium: 1.9 mg/dL (ref 1.7–2.4)

## 2018-10-15 LAB — GLUCOSE, CAPILLARY
Glucose-Capillary: 116 mg/dL — ABNORMAL HIGH (ref 70–99)
Glucose-Capillary: 119 mg/dL — ABNORMAL HIGH (ref 70–99)
Glucose-Capillary: 123 mg/dL — ABNORMAL HIGH (ref 70–99)
Glucose-Capillary: 125 mg/dL — ABNORMAL HIGH (ref 70–99)
Glucose-Capillary: 138 mg/dL — ABNORMAL HIGH (ref 70–99)
Glucose-Capillary: 143 mg/dL — ABNORMAL HIGH (ref 70–99)
Glucose-Capillary: 174 mg/dL — ABNORMAL HIGH (ref 70–99)

## 2018-10-15 LAB — C-REACTIVE PROTEIN: CRP: 12.3 mg/dL — ABNORMAL HIGH (ref <1.0)

## 2018-10-15 LAB — FERRITIN: Ferritin: 149 ng/mL (ref 11–307)

## 2018-10-15 LAB — PROTIME-INR
INR: 2.1 — ABNORMAL HIGH (ref 0.8–1.2)
Prothrombin Time: 23.3 seconds — ABNORMAL HIGH (ref 11.4–15.2)

## 2018-10-15 MED ORDER — POTASSIUM CHLORIDE 20 MEQ/15ML (10%) PO SOLN
20.0000 meq | Freq: Once | ORAL | Status: AC
  Administered 2018-10-15: 20:00:00 20 meq via ORAL
  Filled 2018-10-15: qty 15

## 2018-10-15 MED ORDER — FUROSEMIDE 10 MG/ML IJ SOLN
60.0000 mg | Freq: Two times a day (BID) | INTRAMUSCULAR | Status: DC
  Administered 2018-10-15 – 2018-10-18 (×7): 60 mg via INTRAVENOUS
  Filled 2018-10-15 (×7): qty 6

## 2018-10-15 MED ORDER — FREE WATER
200.0000 mL | Freq: Three times a day (TID) | Status: DC
  Administered 2018-10-15 – 2018-10-16 (×3): 200 mL

## 2018-10-15 MED ORDER — DEXAMETHASONE SODIUM PHOSPHATE 10 MG/ML IJ SOLN
4.0000 mg | Freq: Four times a day (QID) | INTRAMUSCULAR | Status: AC
  Administered 2018-10-15 – 2018-10-16 (×4): 4 mg via INTRAVENOUS
  Filled 2018-10-15 (×4): qty 1

## 2018-10-15 MED ORDER — WARFARIN SODIUM 5 MG PO TABS
5.0000 mg | ORAL_TABLET | Freq: Once | ORAL | Status: AC
  Administered 2018-10-15: 18:00:00 5 mg via ORAL
  Filled 2018-10-15: qty 1

## 2018-10-15 MED ORDER — POTASSIUM CHLORIDE 20 MEQ/15ML (10%) PO SOLN
40.0000 meq | ORAL | Status: AC
  Administered 2018-10-15 (×2): 40 meq
  Filled 2018-10-15 (×2): qty 30

## 2018-10-15 MED ORDER — POTASSIUM CHLORIDE 2 MEQ/ML IV SOLN
INTRAVENOUS | Status: DC
  Administered 2018-10-15 – 2018-10-16 (×2): via INTRAVENOUS
  Filled 2018-10-15 (×3): qty 1000

## 2018-10-15 MED ORDER — MAGNESIUM SULFATE 2 GM/50ML IV SOLN
2.0000 g | Freq: Once | INTRAVENOUS | Status: AC
  Administered 2018-10-15: 2 g via INTRAVENOUS
  Filled 2018-10-15: qty 50

## 2018-10-15 NOTE — Progress Notes (Signed)
Called and updated patients sister Annice Pih. Will schedule a [employer] camera visit for this afternoon.

## 2018-10-15 NOTE — Progress Notes (Signed)
PROGRESS NOTE                                                                                                                                                                                                             Patient Demographics:    Emily Fry, is a 63 y.o. female, DOB - May 16, 1956, QTM:[id number]  Admit date - 10/24/2018   Admitting Physician Albertine Patricia, MD  Outpatient Primary MD for the patient is Kirk Ruths, MD  LOS - 3   Chief Complaint  Patient presents with  . Shortness of Breath  . Altered Mental Status       Brief Narrative    63 year old female with history of chronic respiratory failure, COPD on baseline 2L, OSA, morbid obesity, Afib on coumadin, diastolic heart failure, GERD, HTN who presents from [place] home with one day history of increasing shortness of breath and hypoxia.   Recent hospitalization 4/28- 4/30 acute on chronic hypercarbic/ hypoxic respiratory failure with two negative COVID test.  -Presents to ED with significant dyspnea, hypoxia, requiring nonrebreather, initially in ED she did require BiPAP, she was intubated in ED, she tested positive for COVID-19, her work-up significant for volume overload as well, she was extubated 10/14/2018, she did require reintubation that same day    Subjective:    Emily Fry with no significant events overnight as discussed with staff.   Assessment  & Plan :    Active Problems:   Type 2 diabetes mellitus with hyperosmolar nonketotic hyperglycemia (HCC)   A-fib (HCC)   COPD (chronic obstructive pulmonary disease) (HCC)   Gout   Benign essential HTN   Acute respiratory failure with hypoxia (HCC)   Acute on chronic diastolic heart failure (HCC)   Morbid obesity (HCC)  Acute on chronic respiratory failure with hypercapnia -She is on baseline 2 to 3 L nasal cannula, in ED she is in severe hypercapnia with metabolic acidosis . -Intubated in ED  -Respiratory failure felt more likely due to volume overload from her CHF, more than actual lung injury from COVID-19 infection . -Vent management and sedation per PCCM, did tolerate pressure support very well yesterday, extubated, but then she become lethargic, obtunded and hypoxic, she required reintubation . -This morning she is doing well on pressure support, continue with IV diuresis. Marland Kitchen  COVID 19  infection -Respiratory failure currently more likely related to volume overload than lung injury from COVID-19 infection, her CRP did increase today from < 0.8 then  8.4 then 12.3, as well ferritin started to increase, so we will monitor closely and keep trending these markers. - at this point no indication to consider Actemra  Acute Metabolic Encephalopathy -in the Setting of hypercapnia, resolved  Parox atrial fibrillation -Heart rate was uncontrolled yesterday, resumed back on her metoprolol and Cardizem - continue with warfarin, pharmacy to dose  Acute on chronic diastolic CHF -Continue with IV diuresis, renal function remained stable, will increase her Lasix to 60 mg IV twice daily, . -The echo obtained this admission with a preserved EF  -Continue daily weight, strict ins and outs  -Please see discussion below regarding hyponatremia -Resume home dose acetazolamide  CAD -Continue with home dose aspirin, continue with metoprolol  Diabetes mellitus type 2 -CBGs are controlled, will hold home dose Lantus, continue with insulin sliding scale  Hypokalemia -Repleting,  Hypophosphatemia -Resolved after replacement with Neutra-Phos  Hypernatremia -Room at 149, this is most likely due to diuresis, is already on free water via GT, will start on D5W 30 cc/h.    COVID-19 Labs  Recent Labs    10/13/18 0545 10/14/18 0515 10/15/18 0323  FERRITIN 87 132 149  CRP <0.8 8.4* 12.3*    Lab Results  Component Value Date   SARSCOV2NAA POSITIVE (A) 10/12/2018   [place] NEGATIVE  10/07/2018   SARSCOV2NAA NOT DETECTED 10/07/2018       Code Status : Full  Family Communication  : Disussed with sister via phone 10/15/2018.  Disposition Plan  : remains in ICU.  Consults  :  PCCM  Procedures  : intubated 10/12/2018  DVT Prophylaxis  :  On warfarin  Lab Results  Component Value Date   PLT 120 (L) 10/15/2018    Antibiotics  :   Anti-infectives (From admission, onward)   None        Objective:   Vitals:   10/15/18 1200 10/15/18 1230 10/15/18 1232 10/15/18 1300  BP: 103/61 (!) 81/59 101/65 (!) 97/56  Pulse: 92 87 91 94  Resp: (!) '22 19 19 17  ' Temp:  99.7 F (37.6 C)    TempSrc:  Axillary    SpO2: 93% 94% 92% 92%  Weight:      Height:        Wt Readings from Last 3 Encounters:  10/15/18 (!) 154.2 kg  10/07/18 (!) 150.6 kg  04/14/18 (!) 181.4 kg     Intake/Output Summary (Last 24 hours) at 10/15/2018 1409 Last data filed at 10/15/2018 1300 Gross per 24 hour  Intake 1157.3 ml  Output 2345 ml  Net -1187.7 ml     Physical Exam  Awake Alert, Oriented X 3, vented, but appears comfortable in no apparent distress Symmetrical Chest wall movement, Good air movement bilaterally, CTAB RRR,No Gallops,Rubs or new Murmurs, No Parasternal Heave +ve B.Sounds, Abd Soft, No tenderness, No rebound - guarding or rigidity. No Cyanosis, Clubbing, mild edema, No new Rash or bruise       Data Review:    CBC Recent Labs  Lab 10/12/18 0011 10/12/18 0433  10/13/18 0352 10/13/18 0545 10/14/18 0515 10/14/18 1423 10/15/18 0323  WBC 7.1 5.5  --   --  5.6 6.4  --  4.0  HGB 11.5* 11.4*   < > 10.9* 10.7* 11.1* 11.6* 10.0*  HCT 41.7 40.9   < > 32.0* 36.4 41.5 34.0* 35.0*  PLT 171 164  --   --  166 148*  --  120*  MCV 102.5* 101.2*  --   --  95.5 103.0*  --  100.3*  MCH 28.3 28.2  --   --  28.1 27.5  --  28.7  MCHC 27.6* 27.9*  --   --  29.4* 26.7*  --  28.6*  RDW 14.6 14.6  --   --  14.7 15.5  --  15.8*  LYMPHSABS 0.6*  --   --   --   --   --   --   --    MONOABS 0.6  --   --   --   --   --   --   --   EOSABS 0.1  --   --   --   --   --   --   --   BASOSABS 0.0  --   --   --   --   --   --   --    < > = values in this interval not displayed.    Chemistries  Recent Labs  Lab 10/12/18 0011  10/12/18 1142 10/12/18 1700  10/13/18 0545 10/13/18 1530 10/14/18 0515 10/14/18 1423 10/15/18 0323  NA 141   < >  --   --    < > 142 144 146* 146* 149*  K 4.6   < >  --   --    < > 2.7* 3.9 3.9 3.6 2.9*  CL 98  --   --   --   --  100 100 101  --  105  CO2 34*  --   --   --   --  34* 34* 35*  --  36*  GLUCOSE 129*  --   --   --   --  112* 135* 117*  --  137*  BUN 25*  --   --   --   --  44* 48* 64*  --  70*  CREATININE 1.27*  --   --   --   --  1.12* 1.10* 1.35*  --  1.25*  CALCIUM 9.1  --   --   --   --  8.8* 8.8* 8.3*  --  8.0*  MG  --   --  2.7* 2.1  --  2.0 2.1  --   --  1.9  AST 39  --   --   --   --   --   --   --   --   --   ALT 33  --   --   --   --   --   --   --   --   --   ALKPHOS 117  --   --   --   --   --   --   --   --   --   BILITOT 0.6  --   --   --   --   --   --   --   --   --    < > = values in this interval not displayed.   ------------------------------------------------------------------------------------------------------------------ No results for input(s): CHOL, HDL, LDLCALC, TRIG, CHOLHDL, LDLDIRECT in the last 72 hours.  Lab Results  Component Value Date   HGBA1C 11.9 (H) 08/25/2015   ------------------------------------------------------------------------------------------------------------------ No results for input(s): TSH, T4TOTAL, T3FREE, THYROIDAB in the last 72 hours.  Invalid input(s): FREET3 ------------------------------------------------------------------------------------------------------------------ Recent Labs  10/14/18 0515 10/15/18 0323  FERRITIN 132 149    Coagulation profile Recent Labs  Lab 10/09/18 0502 10/12/18 0544 10/13/18 0545 10/14/18 0515 10/15/18 0323  INR 2.7* 2.2*  1.8* 1.7* 2.1*    No results for input(s): DDIMER in the last 72 hours.  Cardiac Enzymes Recent Labs  Lab 10/12/18 0011  TROPONINI <0.03   ------------------------------------------------------------------------------------------------------------------    Component Value Date/Time   BNP 201.2 (H) 10/12/2018 0011    Inpatient Medications  Scheduled Meds: . acetaZOLAMIDE  250 mg Per Tube BID  . aspirin  81 mg Per Tube Daily  . chlorhexidine gluconate (MEDLINE KIT)  15 mL Mouth Rinse BID  . Chlorhexidine Gluconate Cloth  6 each Topical Daily  . diltiazem  30 mg Per Tube Q8H  . feeding supplement (PRO-STAT SUGAR FREE 64)  30 mL Per Tube TID  . free water  200 mL Per Tube Q8H  . furosemide  40 mg Intravenous Q12H  . insulin aspart  0-9 Units Subcutaneous Q4H  . mouth rinse  15 mL Mouth Rinse 10 times per day  . metoprolol tartrate  25 mg Per Tube BID  . pantoprazole (PROTONIX) IV  40 mg Intravenous QHS  . warfarin  5 mg Oral ONCE-1800  . Warfarin - Pharmacist Dosing Inpatient   Does not apply q1800   Continuous Infusions: . dextrose 5 % with kcl Stopped (10/15/18 1256)  . feeding supplement (VITAL HIGH PROTEIN) 1,000 mL (10/15/18 1129)  . fentaNYL infusion INTRAVENOUS 100 mcg/hr (10/15/18 1300)   PRN Meds:.bisacodyl, docusate, fentaNYL, Ipratropium-Albuterol, midazolam, midazolam, polyvinyl alcohol, sodium chloride flush  Micro Results Recent Results (from the past 240 hour(s))  SARS Coronavirus 2 [facility] order, Performed in [place] hospital lab)     Status: None   Collection Time: 10/07/18  6:45 PM  Result Value Ref Range Status   SARS Coronavirus 2 NEGATIVE NEGATIVE Final    Comment: (NOTE) If result is NEGATIVE SARS-CoV-2 target nucleic acids are NOT DETECTED. The SARS-CoV-2 RNA is generally detectable in upper and lower  respiratory specimens during the acute phase of infection. The lowest  concentration of SARS-CoV-2 viral copies this assay can detect is  250  copies / mL. A negative result does not preclude SARS-CoV-2 infection  and should not be used as the sole basis for treatment or other  patient management decisions.  A negative result may occur with  improper specimen collection / handling, submission of specimen other  than nasopharyngeal swab, presence of viral mutation(s) within the  areas targeted by this assay, and inadequate number of viral copies  (<250 copies / mL). A negative result must be combined with clinical  observations, patient history, and epidemiological information. If result is POSITIVE SARS-CoV-2 target nucleic acids are DETECTED. The SARS-CoV-2 RNA is generally detectable in upper and lower  respiratory specimens dur ing the acute phase of infection.  Positive  results are indicative of active infection with SARS-CoV-2.  Clinical  correlation with patient history and other diagnostic information is  necessary to determine patient infection status.  Positive results do  not rule out bacterial infection or co-infection with other viruses. If result is PRESUMPTIVE POSTIVE SARS-CoV-2 nucleic acids MAY BE PRESENT.   A presumptive positive result was obtained on the submitted specimen  and confirmed on repeat testing.  While 2019 novel coronavirus  (SARS-CoV-2) nucleic acids may be present in the submitted sample  additional confirmatory testing may be necessary for epidemiological  and / or clinical management purposes  to differentiate between  SARS-CoV-2 and other Sarbecovirus currently known to infect humans.  If clinically indicated additional testing with an alternate test  methodology [phone]) is advised. The SARS-CoV-2 RNA is generally  detectable in upper and lower respiratory sp ecimens during the acute  phase of infection. The expected result is Negative. Fact Sheet for Patients:  StrictlyIdeas.no Fact Sheet for Healthcare Providers:  BankingDealers.co.za This test is not yet approved or cleared by the [place] FDA and has been authorized for detection and/or diagnosis of SARS-CoV-2 by FDA under an Emergency Use Authorization (EUA).  This EUA will remain in effect (meaning this test can be used) for the duration of the COVID-19 declaration under Section 564(b)(1) of the Act, 21 U.S.C. section 360bbb-3(b)(1), unless the authorization is terminated or revoked sooner. Performed at [facility], [place] [address], [place], [place] [address]   Novel Coronavirus, NAA (hospital order; send-out to ref lab)     Status: None   Collection Time: 10/07/18  6:45 PM  Result Value Ref Range Status   SARS-CoV-2, NAA NOT DETECTED NOT DETECTED Final    Comment: (NOTE) This test was developed and its performance characteristics determined by [employer]. This test has not been FDA cleared or approved. This test has been authorized by FDA under an Emergency Use Authorization (EUA). This test has been validated in accordance with the FDA's Guidance Document (Policy for [place] in Laboratories Certified to Perform High Complexity Testing under CLIA prior to Emergency Use Authorization for Coronavirus [id number]-2094 during the [facility] Emergency) issued on February 29th, 2020. FDA independent review of this validation is pending. This test is only authorized for the duration of time the declaration that circumstances exist justifying the authorization of the emergency use of in vitro diagnostic tests for detection of SARS-CoV- 2 virus and/or diagnosis of COVID-19 infection under section 564(b)(1) of the Act, 21 U.S.C. 709GGE-3(M)(6), unless the authorization is terminated or revoked sooner. Performed At: [facility] [address] [place], [place] [id number] Rush Farmer MD KP:[id number]    [place]  Final    Comment: Performed at [facility], [place] [address], [place], [place] [address]  Respiratory Panel by PCR     Status: None   Collection Time: 10/07/18  6:45 PM  Result Value Ref Range Status   Adenovirus NOT DETECTED NOT DETECTED Final   Coronavirus 229E NOT DETECTED NOT DETECTED Final    Comment: (NOTE) The Coronavirus on the Respiratory Panel, DOES NOT test for the novel  Coronavirus (2019 nCoV)    Coronavirus HKU1 NOT DETECTED NOT DETECTED Final   Coronavirus NL63 NOT DETECTED NOT DETECTED Final   Coronavirus OC43 NOT DETECTED NOT DETECTED Final   Metapneumovirus NOT DETECTED NOT DETECTED Final   Rhinovirus / Enterovirus NOT DETECTED NOT DETECTED Final   Influenza A NOT DETECTED NOT DETECTED Final   Influenza B NOT DETECTED NOT DETECTED Final   Parainfluenza Virus 1 NOT DETECTED NOT DETECTED Final   Parainfluenza Virus 2 NOT DETECTED NOT DETECTED Final   Parainfluenza Virus 3 NOT DETECTED NOT DETECTED Final   Parainfluenza Virus 4 NOT DETECTED NOT DETECTED Final   Respiratory Syncytial Virus NOT DETECTED NOT DETECTED Final   Bordetella pertussis NOT DETECTED NOT DETECTED Final   Chlamydophila pneumoniae NOT DETECTED NOT DETECTED Final   Mycoplasma pneumoniae NOT DETECTED NOT DETECTED Final    Comment: Performed at [facility], [place]. [address] [place], [place] [address]  Culture,  blood (routine x 2) Call MD if unable to obtain prior to antibiotics being given     Status: Abnormal   Collection Time: 10/07/18 11:45 PM  Result Value Ref Range Status   Specimen Description BLOOD RIGHT ARM  Final   Special Requests   Final    BOTTLES DRAWN AEROBIC AND ANAEROBIC Blood Culture results may not be optimal due to an excessive volume of blood received in culture bottles   Culture  Setup Time   Final    GRAM POSITIVE COCCI AEROBIC BOTTLE ONLY CRITICAL RESULT CALLED TO, READ BACK BY AND VERIFIED WITH: PHRMD J ORIET '@0354'  10/09/18 BY S GEZAHEGN    Culture (A)  Final    STAPHYLOCOCCUS SPECIES (COAGULASE NEGATIVE)  THE SIGNIFICANCE OF ISOLATING THIS ORGANISM FROM A SINGLE VENIPUNCTURE CANNOT BE PREDICTED WITHOUT FURTHER CLINICAL AND CULTURE CORRELATION. SUSCEPTIBILITIES AVAILABLE ONLY ON REQUEST. Performed at [facility], [place] [address], [place], [place] [address]    Report Status 10/12/2018 FINAL  Final  Blood Culture ID Panel (Reflexed)     Status: Abnormal   Collection Time: 10/07/18 11:45 PM  Result Value Ref Range Status   Enterococcus species NOT DETECTED NOT DETECTED Final   Listeria monocytogenes NOT DETECTED NOT DETECTED Final   Staphylococcus species DETECTED (A) NOT DETECTED Final    Comment: Methicillin (oxacillin) resistant coagulase negative staphylococcus. Possible blood culture contaminant (unless isolated from more than one blood culture draw or clinical case suggests pathogenicity). No antibiotic treatment is indicated for blood  culture contaminants. CRITICAL RESULT CALLED TO, READ BACK BY AND VERIFIED WITH: PHRMD J ORIET '@0354'  10/09/18 BY S GEZAHEGN    Staphylococcus aureus (BCID) NOT DETECTED NOT DETECTED Final   Methicillin resistance DETECTED (A) NOT DETECTED Final    Comment: CRITICAL RESULT CALLED TO, READ BACK BY AND VERIFIED WITH: PHRMD J ORIET '@0354'  10/09/18 BY S GEZAHEGN    Streptococcus species NOT DETECTED NOT DETECTED Final   Streptococcus agalactiae NOT DETECTED NOT DETECTED Final   Streptococcus pneumoniae NOT DETECTED NOT DETECTED Final   Streptococcus pyogenes NOT DETECTED NOT DETECTED Final   Acinetobacter baumannii NOT DETECTED NOT DETECTED Final   Enterobacteriaceae species NOT DETECTED NOT DETECTED Final   Enterobacter cloacae complex NOT DETECTED NOT DETECTED Final   Escherichia coli NOT DETECTED NOT DETECTED Final   Klebsiella oxytoca NOT DETECTED NOT DETECTED Final   Klebsiella pneumoniae NOT DETECTED NOT DETECTED Final   Proteus species NOT DETECTED NOT DETECTED Final   Serratia marcescens NOT DETECTED NOT DETECTED Final   Haemophilus influenzae  NOT DETECTED NOT DETECTED Final   Neisseria meningitidis NOT DETECTED NOT DETECTED Final   Pseudomonas aeruginosa NOT DETECTED NOT DETECTED Final   Candida albicans NOT DETECTED NOT DETECTED Final   Candida glabrata NOT DETECTED NOT DETECTED Final   Candida krusei NOT DETECTED NOT DETECTED Final   Candida parapsilosis NOT DETECTED NOT DETECTED Final   Candida tropicalis NOT DETECTED NOT DETECTED Final    Comment: Performed at [facility], [place]. [address], [place], [place] [address]  Culture, blood (routine x 2) Call MD if unable to obtain prior to antibiotics being given     Status: None   Collection Time: 10/08/18 12:08 AM  Result Value Ref Range Status   Specimen Description BLOOD RIGHT HAND  Final   Special Requests   Final    BOTTLES DRAWN AEROBIC ONLY Blood Culture adequate volume   Culture   Final    NO GROWTH 5 DAYS Performed  at [facility], [place] [address], [place], [place] [address]    Report Status 10/13/2018 FINAL  Final  MRSA PCR Screening     Status: None   Collection Time: 10/08/18  1:06 AM  Result Value Ref Range Status   MRSA by PCR NEGATIVE NEGATIVE Final    Comment:        The GeneXpert MRSA Assay (FDA approved for NASAL specimens only), is one component of a comprehensive MRSA colonization surveillance program. It is not intended to diagnose MRSA infection nor to guide or monitor treatment for MRSA infections. Performed at [facility], [place] [address] [place], [place] [address]   Urine culture     Status: Abnormal   Collection Time: 10/08/18  1:34 PM  Result Value Ref Range Status   Specimen Description URINE, CLEAN CATCH  Final   Special Requests   Final    Immunocompromised Performed at [facility], [place] [address], [place], [place] [address]    Culture MULTIPLE SPECIES PRESENT, SUGGEST RECOLLECTION (A)  Final   Report Status 10/09/2018 FINAL  Final  SARS Coronavirus 2 [facility] order, Performed in [place] hospital lab)      Status: Abnormal   Collection Time: 10/12/18 12:11 AM  Result Value Ref Range Status   SARS Coronavirus 2 POSITIVE (A) NEGATIVE Final    Comment: RESULT CALLED TO, READ BACK BY AND VERIFIED WITH: K MUNNET '@0159'  10/12/18 BY S GEZAHEGN (NOTE) If result is NEGATIVE SARS-CoV-2 target nucleic acids are NOT DETECTED. The SARS-CoV-2 RNA is generally detectable in upper and lower  respiratory specimens during the acute phase of infection. The lowest  concentration of SARS-CoV-2 viral copies this assay can detect is 250  copies / mL. A negative result does not preclude SARS-CoV-2 infection  and should not be used as the sole basis for treatment or other  patient management decisions.  A negative result may occur with  improper specimen collection / handling, submission of specimen other  than nasopharyngeal swab, presence of viral mutation(s) within the  areas targeted by this assay, and inadequate number of viral copies  (<250 copies / mL). A negative result must be combined with clinical  observations, patient history, and epidemiological information. If result is POSITIVE SARS-CoV-2 target nucleic acids are DETECTED. T he SARS-CoV-2 RNA is generally detectable in upper and lower  respiratory specimens during the acute phase of infection.  Positive  results are indicative of active infection with SARS-CoV-2.  Clinical  correlation with patient history and other diagnostic information is  necessary to determine patient infection status.  Positive results do  not rule out bacterial infection or co-infection with other viruses. If result is PRESUMPTIVE POSTIVE SARS-CoV-2 nucleic acids MAY BE PRESENT.   A presumptive positive result was obtained on the submitted specimen  and confirmed on repeat testing.  While 2019 novel coronavirus  (SARS-CoV-2) nucleic acids may be present in the submitted sample  additional confirmatory testing may be necessary for epidemiological  and / or clinical  management purposes  to differentiate between  SARS-CoV-2 and other Sarbecovirus currently known to infect humans.  If clinically indicated additional testing with an alternate test  methodology [phone]) is  advised. The SARS-CoV-2 RNA is generally  detectable in upper and lower respiratory specimens during the acute  phase of infection. The expected result is Negative. Fact Sheet for Patients:  StrictlyIdeas.no Fact Sheet for Healthcare Providers: BankingDealers.co.za This test is not yet approved or cleared by the  [place] Architectural technologist and has been authorized for detection and/or diagnosis of SARS-CoV-2 by FDA under an Print production planner (EUA).  This EUA will remain in effect (meaning this test can be used) for the duration of the COVID-19 declaration under Section 564(b)(1) of the Act, 21 U.S.C. section 360bbb-3(b)(1), unless the authorization is terminated or revoked sooner. Performed at [facility], [place] [address], [place], [place] [address]   Blood Culture (routine x 2)     Status: None (Preliminary result)   Collection Time: 10/12/18 12:11 AM  Result Value Ref Range Status   Specimen Description BLOOD RIGHT HAND  Final   Special Requests   Final    BOTTLES DRAWN AEROBIC AND ANAEROBIC Blood Culture results may not be optimal due to an inadequate volume of blood received in culture bottles   Culture   Final    NO GROWTH 3 DAYS Performed at [facility], [place] [address], [place], [place] [address]    Report Status PENDING  Incomplete  Blood Culture (routine x 2)     Status: None (Preliminary result)   Collection Time: 10/12/18 12:32 AM  Result Value Ref Range Status   Specimen Description BLOOD SITE NOT SPECIFIED  Final   Special Requests   Final    BOTTLES DRAWN AEROBIC AND ANAEROBIC Blood Culture results may not be optimal due to an inadequate volume of blood received in culture bottles   Culture   Final     NO GROWTH 3 DAYS Performed at [facility], [place] [address], [place], [place] [address]    Report Status PENDING  Incomplete  Culture, respiratory (non-expectorated)     Status: None   Collection Time: 10/12/18  8:15 AM  Result Value Ref Range Status   Specimen Description TRACHEAL ASPIRATE  Final   Special Requests NONE  Final   Gram Stain   Final    RARE WBC PRESENT, PREDOMINANTLY PMN NO ORGANISMS SEEN    Culture   Final    RARE Consistent with normal respiratory flora. Performed at [facility], [place] [address] [place], [place] [address]    Report Status 10/14/2018 FINAL  Final    Radiology Reports Dg Abd 1 View  Result Date: 10/14/2018 CLINICAL DATA:  Feeding tube placement EXAM: ABDOMEN - 1 VIEW COMPARISON:  10/12/2018 FINDINGS: The tip of the enteric tube projects over the duodenum. The tip is pointed distally. The bowel gas pattern is nonspecific. IMPRESSION: Tip of the enteric tube projects over the duodenum. Electronically Signed   By: Constance Holster M.D.   On: 10/14/2018 15:03   Dg Abd 1 View  Result Date: 10/12/2018 CLINICAL DATA:  Enteric tube placement. EXAM: ABDOMEN - 1 VIEW COMPARISON:  CT abdomen pelvis dated May 08, 2014. FINDINGS: Enteric tube tip in the stomach. The visualized bowel gas pattern is normal. No radio-opaque calculi or other significant radiographic abnormality are seen. No acute osseous abnormality. IMPRESSION: 1. Enteric tube in the stomach. Electronically Signed   By: Titus Dubin M.D.   On: 10/12/2018 06:24   Ct Head Wo Contrast  Result Date: 10/07/2018 CLINICAL DATA:  Seizure.  Bilateral hand twitching EXAM: CT HEAD WITHOUT CONTRAST TECHNIQUE: Contiguous axial images were obtained from the base of the skull through the vertex without intravenous contrast. COMPARISON:  None. FINDINGS: Brain: Ventricle size and cerebral volume normal. Negative for acute infarct. Negative for hemorrhage or mass. Vascular: Negative for hyperdense  vessel Skull: Negative Sinuses/Orbits: Bilateral exophthalmos.  No orbital mass. Paranasal sinuses clear. Other: None IMPRESSION: No acute intracranial abnormality Exophthalmos. Electronically Signed   By: Franchot Gallo M.D.   On: 10/07/2018 17:10   Dg Chest Port 1 View  Result Date: 10/15/2018 CLINICAL DATA:  Hypoxia EXAM: PORTABLE CHEST 1 VIEW COMPARISON:  Yesterday FINDINGS: Endotracheal tube tip between the clavicular heads and carina. The orogastric tube at least reaches the stomach. Bilateral airspace disease without convincing change from prior. Cardiomegaly. No definite effusion. No pneumothorax. New PICC on the left with tip near the SVC origin. IMPRESSION: 1. New PICC with tip near the SVC origin. 2. Endotracheal and orogastric tubes in expected position. 3. Cardiomegaly and bilateral airspace disease without significant change from prior. Electronically Signed   By: Monte Fantasia M.D.   On: 10/15/2018 11:12   Dg Chest Port 1 View  Result Date: 10/14/2018 CLINICAL DATA:  Viral pneumonia EXAM: PORTABLE CHEST 1 VIEW COMPARISON:  Chest x-ray dated 10/13/2018 FINDINGS: The endotracheal tube terminates above the carina. Enteric tube extends below the left hemidiaphragm. The cardiac silhouette is significantly enlarged. Diffuse bilateral hazy airspace opacities are again noted. There is no pneumothorax. There may be small bilateral pleural effusions. IMPRESSION: No significant interval change. Electronically Signed   By: Constance Holster M.D.   On: 10/14/2018 15:02   Dg Chest Port 1 View  Result Date: 10/13/2018 CLINICAL DATA:  Intubation EXAM: PORTABLE CHEST 1 VIEW COMPARISON:  Yesterday FINDINGS: Endotracheal tube tip between the clavicular heads and carina. The orogastric tube at least reaches the stomach. Cardiomegaly and vascular pedicle widening. Low lung volumes with diffuse pulmonary opacification that is similar to prior. No air leak or definite/increasing pleural fluid. IMPRESSION:  Unchanged hardware positioning and bilateral airspace disease. Electronically Signed   By: Monte Fantasia M.D.   On: 10/13/2018 08:04   Dg Chest Port 1 View  Result Date: 10/12/2018 CLINICAL DATA:  Endotracheal tube placement. EXAM: PORTABLE CHEST 1 VIEW COMPARISON:  Chest x-ray from same day at 3:36 a.m. FINDINGS: Endotracheal tube in place with the tip now 8 mm above the carina. Enteric tube in the stomach. Stable cardiomegaly. Persistent low lung volumes with moderate pulmonary edema and small bilateral pleural effusions. Slightly improved aeration at the right lung base. Persistent dense retrocardiac opacification, likely atelectasis. No pneumothorax. No acute osseous abnormality. IMPRESSION: 1. Endotracheal tube tip now 8 mm above the carina. Consider retracting an additional 2 cm. 2. Unchanged congestive heart failure. Slightly improved aeration at the right lung base. Electronically Signed   By: Titus Dubin M.D.   On: 10/12/2018 06:28   Dg Chest Port 1 View  Result Date: 10/12/2018 CLINICAL DATA:  Follow-up examination status post endotracheal tube repositioning. EXAM: PORTABLE CHEST 1 VIEW COMPARISON:  Prior radiograph earlier same day. FINDINGS: Tip of the endotracheal tube still remains deep within the right mainstem bronchus. Retraction by approximately 3.5 cm recommended. Remainder of the examination remains unchanged. IMPRESSION: Tip of endotracheal tube remains deep within the right mainstem bronchus. Retraction by approximately 3-3.5 cm recommended. Critical Value/emergent results were called by telephone at the time of interpretation on 10/12/2018 at 4:51 am to Dr. Delora Fuel , who verbally acknowledged these results. Electronically Signed   By: Jeannine Boga M.D.   On: 10/12/2018 04:51   Dg Chest Port 1 View  Result Date: 10/12/2018 CLINICAL DATA:  Initial evaluation for intubation. EXAM: PORTABLE CHEST 1 VIEW COMPARISON:  Prior radiograph from earlier the same day. FINDINGS:  Endotracheal tube in place, position deep with  tip in the right mainstem bronchus. At time of this dictation, a second film has been obtained. Tip still appears to be within the right mainstem bronchus. Enteric tube courses into the abdomen. Stable cardiomegaly. Low lung volumes. Pulmonary edema with bilateral pleural effusions again seen, similar to previous. Probable superimposed bibasilar atelectasis. No pneumothorax. Osseous structures unchanged. IMPRESSION: 1. Tip of endotracheal tube positioned within the right mainstem bronchus. At time of this dictation, a second filled with a repositioned tube has already been acquired. 2. Diffuse pulmonary edema and small bilateral pleural effusions, suggesting CHF. 3. Probable superimposed bibasilar atelectasis. 4. Electronically Signed   By: Jeannine Boga M.D.   On: 10/12/2018 04:47   Dg Chest Port 1 View  Result Date: 10/12/2018 CLINICAL DATA:  Altered mental status, shortness of breath EXAM: PORTABLE CHEST 1 VIEW COMPARISON:  10/07/2018 FINDINGS: Cardiomegaly. Diffuse bilateral airspace disease, favor edema/CHF. Suspect layering effusions. Very low lung volumes. No acute bony abnormality. IMPRESSION: Cardiomegaly with severe diffuse bilateral airspace disease and suspected layering effusions, likely edema/CHF. Low lung volumes. Electronically Signed   By: Rolm Baptise M.D.   On: 10/12/2018 01:01   Dg Chest Portable 1 View  Result Date: 10/07/2018 CLINICAL DATA:  Tremor.  Cough. EXAM: PORTABLE CHEST 1 VIEW COMPARISON:  04/14/2018 and 05/17/2008 FINDINGS: There is abnormal increased density at the left lung base, new since the prior study. Heart size and pulmonary vascularity are normal. There is a new small focal area of linear atelectasis in the right upper lobe. No effusions. IMPRESSION: Area of new infiltrate at the left lung base. Minimal atelectasis in the right upper lobe. Electronically Signed   By: Lorriane Shire M.D.   On: 10/07/2018 17:14   [place]  Ekg Site Rite  Result Date: 10/14/2018 If Site Rite image not attached, placement could not be confirmed due to current cardiac rhythm.   Phillips Climes M.D on 10/15/2018 at 2:09 PM  Between 7am to 7pm - Pager - [phone]  After 7pm go to [email or web address] - password [facility]  [facility] -  Office  [phone]

## 2018-10-15 NOTE — Plan of Care (Signed)

## 2018-10-15 NOTE — Progress Notes (Signed)
Dr. Clearnce Sorrel paged and notified that patient's potassium is 2.9 and magnesium level is 1.9 this morning. Telephone order given to enter electrolyte protocol and replace accordingly.

## 2018-10-15 NOTE — Progress Notes (Signed)
Patient and her sister were able to video via [employer] at this time.

## 2018-10-15 NOTE — Progress Notes (Signed)
NAME:  Emily Fry, MRN:  [id number], DOB:  02-15-1956, LOS: 3 ADMISSION DATE:  11/06/2018, CONSULTATION DATE:  10/12/2018 REFERRING MD:  Dr. Preston Fleeting, CHIEF COMPLAINT:  SOB/ hypoxic  Brief History   63 year old female from [facility] nursing home presenting with increasing SOB day and hypoxia since yesterday.  Severe bilateral airspace disease. COVID positive. Intubated.  During intubation, a tooth was dislodged with temporary bleeding in which has since abated.  PCCM called for admit.   Recent hospitalization 4/28- 4/30 acute on chronic hypercarbic/ hypoxic respiratory failure with two negative COVID test.   Past Medical History  COPD, OSA, chronic hypoxemic/ hypercapneic respiratory failure on baseline 2L , A-fib, Allergic rhinitis, Bilateral primary osteoarthritis of knee, Diastolic heart failure, GERD (gastroesophageal reflux disease), Gout, Hypertension, morbid obesity  Significant Hospital Events   5/2 Admitted  5/5 Extubated and re intubated for hypoxia, hypercarbia. ABG 7.16/PCO2 > 97/PO2 41  Consults:   Procedures:  5/3 ETT >>  Significant Diagnostic Tests:  5/2 CXR >> Cardiomegaly with severe diffuse bilateral airspace disease and suspected layering effusions, likely edema/CHF. Low lung volumes.  Micro Data:  5/3 BCx 2 >> 5/3 COVID 19 >> POSITIVE 5/3 trach asp >>  Antimicrobials:   Interim history/subjective:  Reintubated yesterday after she became unresponsive with ABG of 7.16/PCO2 > 97/PO2 41 No acute events overnight.  She is awake today and weaning again. No cuff leak noted  Objective   Blood pressure 110/67, pulse 90, temperature 99.3 F (37.4 C), temperature source Axillary, resp. rate 18, height 5\' 4"  (1.626 m), weight (!) 154.2 kg, SpO2 99 %.    Vent Mode: PSV;CPAP FiO2 (%):  [40 %-70 %] 40 % Set Rate:  [34 bmp] 34 bmp Vt Set:  [330 mL] 330 mL PEEP:  [5 cmH20-8 cmH20] 5 cmH20 Pressure Support:  [8 cmH20] 8 cmH20 Plateau Pressure:  [21 cmH20-24 cmH20] 23  cmH20   Intake/Output Summary (Last 24 hours) at 10/15/2018 1057 Last data filed at 10/15/2018 0900 Gross per 24 hour  Intake 998.97 ml  Output 3265 ml  Net -2266.03 ml   Filed Weights   10/12/18 0531 10/13/18 0500 10/15/18 0500  Weight: (!) 156.9 kg (!) 150.1 kg (!) 154.2 kg   Examination: Gen:      No acute distress, Obese HEENT:  EOMI, sclera anicteric Neck:     No masses; no thyromegaly Lungs:    Clear to auscultation bilaterally; normal respiratory effort CV:         Regular rate and rhythm; no murmurs Abd:      + bowel sounds; soft, non-tender; no palpable masses, no distension Ext:    No edema; adequate peripheral perfusion Skin:      Warm and dry; no rash Neuro: Awake on vent, alert.   Resolved Hospital Problem list    Assessment & Plan:  Acute on chronic hypoxic and hypercarbic respiratory failure  COVID 19 COPD OSA Difficult airway P:  Given rapid improvement with diuresis suspect CHF as major cause of respiratory decompensation Continue lasix for diuresis Restart PSV trials May reassess for extubation soon. Will recommend use of Bipap on extubation If she fails again then will need a trach Continue nebs Give steroids as she does not have cuff leak.  Best practice:  Diet: NPO Pain/Anxiety/Delirium protocol (if indicated): Versed PRN VAP protocol (if indicated): yes DVT prophylaxis:Coumadin GI prophylaxis: PPI Glucose control: SSI Mobility: BR Code Status: full  Family Communication: Family updated by [facility] Disposition: ICU  Labs  CBC: Recent Labs  Lab 10/12/18 0011 10/12/18 0433  10/13/18 0352 10/13/18 0545 10/14/18 0515 10/14/18 1423 10/15/18 0323  WBC 7.1 5.5  --   --  5.6 6.4  --  4.0  NEUTROABS 5.7  --   --   --   --   --   --   --   HGB 11.5* 11.4*   < > 10.9* 10.7* 11.1* 11.6* 10.0*  HCT 41.7 40.9   < > 32.0* 36.4 41.5 34.0* 35.0*  MCV 102.5* 101.2*  --   --  95.5 103.0*  --  100.3*  PLT 171 164  --   --  166 148*  --  120*   < > =  values in this interval not displayed.    Basic Metabolic Panel: Recent Labs  Lab 10/12/18 0011  10/12/18 1142 10/12/18 1700  10/13/18 0545 10/13/18 1530 10/14/18 0515 10/14/18 1423 10/15/18 0323  NA 141   < >  --   --    < > 142 144 146* 146* 149*  K 4.6   < >  --   --    < > 2.7* 3.9 3.9 3.6 2.9*  CL 98  --   --   --   --  100 100 101  --  105  CO2 34*  --   --   --   --  34* 34* 35*  --  36*  GLUCOSE 129*  --   --   --   --  112* 135* 117*  --  137*  BUN 25*  --   --   --   --  44* 48* 64*  --  70*  CREATININE 1.27*  --   --   --   --  1.12* 1.10* 1.35*  --  1.25*  CALCIUM 9.1  --   --   --   --  8.8* 8.8* 8.3*  --  8.0*  MG  --   --  2.7* 2.1  --  2.0 2.1  --   --  1.9  PHOS  --   --  1.8* 1.2*  --  2.1*  --   --   --  3.1   < > = values in this interval not displayed.   GFR: Estimated Creatinine Clearance: 71.4 mL/min (A) (by C-G formula based on SCr of 1.25 mg/dL (H)). Recent Labs  Lab 10/09/18 0502  10/12/18 0011 10/12/18 0433 10/13/18 0545 10/14/18 0515 10/15/18 0323  PROCALCITON 0.16  --  0.16  --   --   --   --   WBC  --    < > 7.1 5.5 5.6 6.4 4.0  LATICACIDVEN  --   --  0.5 0.9  --   --   --    < > = values in this interval not displayed.    Liver Function Tests: Recent Labs  Lab 10/12/18 0011  AST 39  ALT 33  ALKPHOS 117  BILITOT 0.6  PROT 6.7  ALBUMIN 3.1*   No results for input(s): LIPASE, AMYLASE in the last 168 hours. No results for input(s): AMMONIA in the last 168 hours.  ABG    Component Value Date/Time   PHART 7.365 10/14/2018 1423   PCO2ART 64.1 (H) 10/14/2018 1423   PO2ART 62.0 (L) 10/14/2018 1423   HCO3 36.6 (H) 10/14/2018 1423   TCO2 39 (H) 10/14/2018 1423   O2SAT 90.0 10/14/2018 1423     Coagulation Profile: Recent Labs  Lab 10/09/18 0502 10/12/18 0544 10/13/18 0545 10/14/18 0515 10/15/18 0323  INR 2.7* 2.2* 1.8* 1.7* 2.1*    Cardiac Enzymes: Recent Labs  Lab 10/12/18 0011  TROPONINI <0.03    HbA1C: Hgb A1c  MFr Bld  Date/Time Value Ref Range Status  08/25/2015 02:14 AM 11.9 (H) 4.0 - 6.0 % Final    CBG: Recent Labs  Lab 10/14/18 1544 10/14/18 1957 10/15/18 0002 10/15/18 0340 10/15/18 0759  GLUCAP 110* 100* 125* 116* 138*   The patient is critically ill with multiple organ system failure and requires high complexity decision making for assessment and support, frequent evaluation and titration of therapies, advanced monitoring, review of radiographic studies and interpretation of complex data.   Critical Care Time devoted to patient care services, exclusive of separately billable procedures, described in this note is 35 minutes.   Chilton Greathouse MD [facility] Pager [phone] If no answer call [phone] 10/15/2018, 2:47 PM

## 2018-10-15 NOTE — Progress Notes (Signed)
ANTICOAGULATION CONSULT NOTE - Follow Up Consult  Pharmacy Consult for Warfarin Indication: atrial fibrillation  Allergies  Allergen Reactions  . Contrast Media [Iodinated Diagnostic Agents] Swelling  . Tomato Other (See Comments)    unknow per MAR  . Amoxicillin Diarrhea, Itching and Other (See Comments)    Has patient had a PCN reaction causing immediate rash, facial/tongue/throat swelling, SOB or lightheadedness with hypotension: No Has patient had a PCN reaction causing severe rash involving mucus membranes or skin necrosis: No Has patient had a PCN reaction that required hospitalization: No Has patient had a PCN reaction occurring within the last 10 years: Yes If all of the above answers are "NO", then may proceed with Cephalosporin use.     Patient Measurements: Height: '5\' 4"'  (162.6 cm) Weight: (!) 340 lb (154.2 kg) IBW/kg (Calculated) : 54.7  Vital Signs: Temp: 97.9 F (36.6 C) (05/06 0400) Temp Source: Axillary (05/06 0400) BP: 112/71 (05/06 0828) Pulse Rate: 102 (05/06 0828)  Labs: Recent Labs    10/13/18 0545 10/13/18 1530 10/14/18 0515 10/14/18 1423 10/15/18 0323  HGB 10.7*  --  11.1* 11.6* 10.0*  HCT 36.4  --  41.5 34.0* 35.0*  PLT 166  --  148*  --  120*  LABPROT 20.7*  --  20.1*  --  23.3*  INR 1.8*  --  1.7*  --  2.1*  CREATININE 1.12* 1.10* 1.35*  --  1.25*    Estimated Creatinine Clearance: 71.4 mL/min (A) (by C-G formula based on SCr of 1.25 mg/dL (H)).   Medications:  Scheduled:  . acetaZOLAMIDE  250 mg Per Tube BID  . aspirin  81 mg Per Tube Daily  . chlorhexidine gluconate (MEDLINE KIT)  15 mL Mouth Rinse BID  . Chlorhexidine Gluconate Cloth  6 each Topical Daily  . diltiazem  30 mg Per Tube Q8H  . feeding supplement (PRO-STAT SUGAR FREE 64)  30 mL Per Tube TID  . furosemide  40 mg Intravenous Q12H  . insulin aspart  0-9 Units Subcutaneous Q4H  . mouth rinse  15 mL Mouth Rinse 10 times per day  . metoprolol tartrate  25 mg Per Tube BID   . pantoprazole (PROTONIX) IV  40 mg Intravenous QHS  . potassium chloride  40 mEq Per Tube Q4H  . Warfarin - Pharmacist Dosing Inpatient   Does not apply q1800    Assessment: 19 YOF with h/o Afib on warfarin admitted to ICU with hypercarbic respiratory failure, +COVID-19.  Pharmacy consulted to resume warfarin. INR on admission is therapeutic at 2.2.  Home warfarin regimen: 6 mg on Mon/Wed/Fri; 5 mg on other days   Today, 10/15/2018:  INR increased to 2.1, therapeutic - Note missed dose on 5/2 (last home dose on 5/1, admitted 5/3) CBC: Hgb 10, Plt decreased to 120 No bleeding or complications reported. No major drug-drug interactions noted.  Pt continues on Aspirin 67m daily.   Goal of Therapy:  INR 2-3 Monitor platelets by anticoagulation protocol: Yes   Plan:  Warfarin 5 mg PO x1 at 18:00. Daily PT/INR. Monitor for signs and symptoms of bleeding.   CGretta ArabPharmD, BCPS 10/15/2018 8:32 AM

## 2018-10-16 DIAGNOSIS — I4891 Unspecified atrial fibrillation: Secondary | ICD-10-CM

## 2018-10-16 LAB — GLUCOSE, CAPILLARY
Glucose-Capillary: 191 mg/dL — ABNORMAL HIGH (ref 70–99)
Glucose-Capillary: 197 mg/dL — ABNORMAL HIGH (ref 70–99)
Glucose-Capillary: 141 mg/dL — ABNORMAL HIGH (ref 70–99)
Glucose-Capillary: 148 mg/dL — ABNORMAL HIGH (ref 70–99)
Glucose-Capillary: 188 mg/dL — ABNORMAL HIGH (ref 70–99)
Glucose-Capillary: 198 mg/dL — ABNORMAL HIGH (ref 70–99)

## 2018-10-16 LAB — CBC
HCT: 36.5 % (ref 36.0–46.0)
Hemoglobin: 10.1 g/dL — ABNORMAL LOW (ref 12.0–15.0)
MCH: 28 pg (ref 26.0–34.0)
MCHC: 27.7 g/dL — ABNORMAL LOW (ref 30.0–36.0)
MCV: 101.1 fL — ABNORMAL HIGH (ref 80.0–100.0)
Platelets: 111 10*3/uL — ABNORMAL LOW (ref 150–400)
RBC: 3.61 MIL/uL — ABNORMAL LOW (ref 3.87–5.11)
RDW: 15.9 % — ABNORMAL HIGH (ref 11.5–15.5)
WBC: 4.2 10*3/uL (ref 4.0–10.5)
nRBC: 0.5 % — ABNORMAL HIGH (ref 0.0–0.2)

## 2018-10-16 LAB — PROTIME-INR
INR: 1.9 — ABNORMAL HIGH (ref 0.8–1.2)
Prothrombin Time: 21.1 seconds — ABNORMAL HIGH (ref 11.4–15.2)

## 2018-10-16 LAB — BASIC METABOLIC PANEL
Anion gap: 6 (ref 5–15)
BUN: 75 mg/dL — ABNORMAL HIGH (ref 6–20)
CO2: 37 mmol/L — ABNORMAL HIGH (ref 22–32)
Calcium: 8.3 mg/dL — ABNORMAL LOW (ref 8.9–10.3)
Chloride: 107 mmol/L (ref 98–111)
Creatinine, Ser: 1.25 mg/dL — ABNORMAL HIGH (ref 0.44–1.00)
GFR calc Af Amer: 54 mL/min — ABNORMAL LOW (ref >60)
GFR calc non Af Amer: 47 mL/min — ABNORMAL LOW (ref >60)
Glucose, Bld: 200 mg/dL — ABNORMAL HIGH (ref 70–99)
Potassium: 4.3 mmol/L (ref 3.5–5.1)
Sodium: 150 mmol/L — ABNORMAL HIGH (ref 135–145)

## 2018-10-16 LAB — MAGNESIUM: Magnesium: 2.5 mg/dL — ABNORMAL HIGH (ref 1.7–2.4)

## 2018-10-16 LAB — C-REACTIVE PROTEIN: CRP: 14.6 mg/dL — ABNORMAL HIGH (ref <1.0)

## 2018-10-16 LAB — FERRITIN: Ferritin: 398 ng/mL — ABNORMAL HIGH (ref 11–307)

## 2018-10-16 MED ORDER — POLYETHYLENE GLYCOL 3350 17 G PO PACK
17.0000 g | PACK | Freq: Every day | ORAL | Status: DC
  Administered 2018-10-16: 14:00:00 17 g
  Filled 2018-10-16 (×2): qty 1

## 2018-10-16 MED ORDER — DOCUSATE SODIUM 50 MG/5ML PO LIQD
100.0000 mg | Freq: Every day | ORAL | Status: DC
  Administered 2018-10-16 – 2018-10-17 (×2): 100 mg
  Filled 2018-10-16 (×2): qty 10

## 2018-10-16 MED ORDER — INSULIN ASPART 100 UNIT/ML ~~LOC~~ SOLN
0.0000 [IU] | SUBCUTANEOUS | Status: DC
  Administered 2018-10-16: 3 [IU] via SUBCUTANEOUS
  Administered 2018-10-16: 23:00:00 4 [IU] via SUBCUTANEOUS
  Administered 2018-10-16: 3 [IU] via SUBCUTANEOUS
  Administered 2018-10-16: 20:00:00 4 [IU] via SUBCUTANEOUS
  Administered 2018-10-17: 3 [IU] via SUBCUTANEOUS
  Administered 2018-10-17: 7 [IU] via SUBCUTANEOUS
  Administered 2018-10-17 (×3): 4 [IU] via SUBCUTANEOUS
  Administered 2018-10-18 – 2018-10-19 (×4): 3 [IU] via SUBCUTANEOUS

## 2018-10-16 MED ORDER — TOCILIZUMAB 400 MG/20ML IV SOLN
800.0000 mg | Freq: Once | INTRAVENOUS | Status: AC
  Administered 2018-10-16: 13:00:00 800 mg via INTRAVENOUS
  Filled 2018-10-16: qty 40

## 2018-10-16 MED ORDER — FREE WATER
200.0000 mL | Status: DC
  Administered 2018-10-16: 200 mL

## 2018-10-16 MED ORDER — ORAL CARE MOUTH RINSE
15.0000 mL | Freq: Two times a day (BID) | OROMUCOSAL | Status: DC
  Administered 2018-10-16 – 2018-10-17 (×3): 15 mL via OROMUCOSAL

## 2018-10-16 MED ORDER — FUROSEMIDE 10 MG/ML IJ SOLN
40.0000 mg | Freq: Once | INTRAMUSCULAR | Status: AC
  Administered 2018-10-16: 15:00:00 40 mg via INTRAVENOUS
  Filled 2018-10-16: qty 4

## 2018-10-16 MED ORDER — WARFARIN SODIUM 7.5 MG PO TABS
7.5000 mg | ORAL_TABLET | Freq: Once | ORAL | Status: AC
  Administered 2018-10-16: 7.5 mg via ORAL
  Filled 2018-10-16: qty 1

## 2018-10-16 NOTE — Progress Notes (Signed)
Patient has done very well since extubation at 1430. Patient remains on a non-rebreather. She has had no need for BiPAP at this time. She has remained alert and oriented this afternoon. PT/OT moved patient from bed to chair via lift at 1600. She remains in the chair per her request. She has completed dinner and did very well with maintaining her oxygenation while eating and drinking. Will continue to monitor patient.

## 2018-10-16 NOTE — Progress Notes (Signed)
NAME:  Emily Fry, MRN:  [id number], DOB:  1956-04-10, LOS: 4 ADMISSION DATE:  11/08/2018, CONSULTATION DATE:  5/6 REFERRING MD:  Dr. Preston Fleeting, CHIEF COMPLAINT:  Dyspnea   Brief History   63 year old female came from collapse nursing home with increasing shortness of breath and hypoxemia/COVID 19 on Oct 12, 2018.  Required intubation in the emergency room.   Past Medical History  COPD, obstructive sleep apnea, chronic respiratory failure with hypoxemia, A. fib, allergic rhinitis, bilateral osteoarthritis of knee, diastolic heart failure, gastroesophageal reflux disease, gout, hypertension, morbid obesity  Significant Hospital Events   May 2 admitted May 5 extubated, required reintubation for hypoxemia, hypercarbia  Consults:    Procedures:  May 3 ETT > 5/5, 5/5 > 5/7  Significant Diagnostic Tests:    Micro Data:  5/3 BCx 2 >> 5/3 COVID 19 >> POSITIVE 5/3 trach asp >>  Antimicrobials:    Interim history/subjective:  Weaned all day yesterday on ventilator Wide-awake this morning, weaning again, following commands and writing answers to questions Inflammatory biomarkers increased  Objective   Blood pressure 120/75, pulse (!) 106, temperature 98.7 F (37.1 C), temperature source Axillary, resp. rate (!) 22, height 5\' 4"  (1.626 m), weight (!) 155.8 kg, SpO2 93 %.    Vent Mode: PSV;CPAP FiO2 (%):  [40 %] 40 % Set Rate:  [34 bmp] 34 bmp Vt Set:  [330 mL] 330 mL PEEP:  [5 cmH20] 5 cmH20 Pressure Support:  [5 cmH20-8 cmH20] 5 cmH20 Plateau Pressure:  [18 cmH20-28 cmH20] 18 cmH20   Intake/Output Summary (Last 24 hours) at 10/16/2018 1352 Last data filed at 10/16/2018 1341 Gross per 24 hour  Intake 2041.72 ml  Output 3480 ml  Net -1438.28 ml   Filed Weights   10/13/18 0500 10/15/18 0500 10/16/18 0500  Weight: (!) 150.1 kg (!) 154.2 kg (!) 155.8 kg    Examination:  General:  In bed on vent HENT: NCAT ETT in place PULM: CTA B, vent supported breathing CV: RRR, no  mgr GI: BS+, soft, nontender MSK: normal bulk and tone Neuro: sedated on vent    Resolved Hospital Problem list     Assessment & Plan:  Acute respiratory failure with hypoxemia due to acute pulmonary edema from diastolic heart failure exacerbation: Continue diuresis Monitor urine output, creatinine, BUN Extubate today Aspiration precautions  Chronic hypercarbic respiratory failure due to obesity hypoventilation syndrome BiPAP after extubation and qHS with viral filters Sit up in chair while awake No sedating medicines She says she doesn't want tracheostomy if requires re-intubation  COVID-19: Inflammatory markers worsening, suspect symptoms may worsen soon Actemra today  Maintain in ICU  Best practice:  Diet: NPO after extubation Pain/Anxiety/Delirium protocol (if indicated): d/c VAP protocol (if indicated): n/a DVT prophylaxis: warfarin GI prophylaxis: PPI Glucose control: SSI Mobility: PT consult, out of bed Code Status: full Family Communication: TRH to discuss with sister Annice Pih today Disposition: remain in ICU  Labs   CBC: Recent Labs  Lab 10/12/18 0011 10/12/18 0433  10/13/18 0545 10/14/18 0515 10/14/18 1423 10/15/18 0323 10/16/18 0225  WBC 7.1 5.5  --  5.6 6.4  --  4.0 4.2  NEUTROABS 5.7  --   --   --   --   --   --   --   HGB 11.5* 11.4*   < > 10.7* 11.1* 11.6* 10.0* 10.1*  HCT 41.7 40.9   < > 36.4 41.5 34.0* 35.0* 36.5  MCV 102.5* 101.2*  --  95.5 103.0*  --  100.3*  101.1*  PLT 171 164  --  166 148*  --  120* 111*   < > = values in this interval not displayed.    Basic Metabolic Panel: Recent Labs  Lab 10/12/18 1142 10/12/18 1700  10/13/18 0545 10/13/18 1530 10/14/18 0515 10/14/18 1423 10/15/18 0323 10/16/18 0225  NA  --   --    < > 142 144 146* 146* 149* 150*  K  --   --    < > 2.7* 3.9 3.9 3.6 2.9* 4.3  CL  --   --   --  100 100 101  --  105 107  CO2  --   --   --  34* 34* 35*  --  36* 37*  GLUCOSE  --   --   --  112* 135* 117*  --   137* 200*  BUN  --   --   --  44* 48* 64*  --  70* 75*  CREATININE  --   --   --  1.12* 1.10* 1.35*  --  1.25* 1.25*  CALCIUM  --   --   --  8.8* 8.8* 8.3*  --  8.0* 8.3*  MG 2.7* 2.1  --  2.0 2.1  --   --  1.9 2.5*  PHOS 1.8* 1.2*  --  2.1*  --   --   --  3.1  --    < > = values in this interval not displayed.   GFR: Estimated Creatinine Clearance: 71.9 mL/min (A) (by C-G formula based on SCr of 1.25 mg/dL (H)). Recent Labs  Lab 10/12/18 0011 10/12/18 0433 10/13/18 0545 10/14/18 0515 10/15/18 0323 10/16/18 0225  PROCALCITON 0.16  --   --   --   --   --   WBC 7.1 5.5 5.6 6.4 4.0 4.2  LATICACIDVEN 0.5 0.9  --   --   --   --     Liver Function Tests: Recent Labs  Lab 10/12/18 0011  AST 39  ALT 33  ALKPHOS 117  BILITOT 0.6  PROT 6.7  ALBUMIN 3.1*   No results for input(s): LIPASE, AMYLASE in the last 168 hours. No results for input(s): AMMONIA in the last 168 hours.  ABG    Component Value Date/Time   PHART 7.365 10/14/2018 1423   PCO2ART 64.1 (H) 10/14/2018 1423   PO2ART 62.0 (L) 10/14/2018 1423   HCO3 36.6 (H) 10/14/2018 1423   TCO2 39 (H) 10/14/2018 1423   O2SAT 90.0 10/14/2018 1423     Coagulation Profile: Recent Labs  Lab 10/12/18 0544 10/13/18 0545 10/14/18 0515 10/15/18 0323 10/16/18 0225  INR 2.2* 1.8* 1.7* 2.1* 1.9*    Cardiac Enzymes: Recent Labs  Lab 10/12/18 0011  TROPONINI <0.03    HbA1C: Hgb A1c MFr Bld  Date/Time Value Ref Range Status  08/25/2015 02:14 AM 11.9 (H) 4.0 - 6.0 % Final    CBG: Recent Labs  Lab 10/15/18 1957 10/15/18 2342 10/16/18 0403 10/16/18 0733 10/16/18 1253  GLUCAP 143* 174* 191* 197* 141*     Critical care time: 35 minutes     Heber Encinal, MD [facility] Pager: [phone] Cell: [phone] If no response, call [phone]

## 2018-10-16 NOTE — Progress Notes (Signed)
PROGRESS NOTE                                                                                                                                                                                                             Patient Demographics:    Emily Fry, is a 63 y.o. female, DOB - 04-Apr-1956, HYI:[id number]  Admit date - 11/04/2018   Admitting Physician Emily Patricia, MD  Outpatient Primary MD for the patient is Emily Ruths, MD  LOS - 4   Chief Complaint  Patient presents with   Shortness of Breath   Altered Mental Status       Brief Narrative    63 year old female with history of chronic respiratory failure, COPD on baseline 2L, OSA, morbid obesity, Afib on coumadin, diastolic heart failure, GERD, HTN who presents from [facility] home with one day history of increasing shortness of breath and hypoxia.   Recent hospitalization 4/28- 4/30 acute on chronic hypercarbic/ hypoxic respiratory failure with two negative COVID test.  -Presents to ED with significant dyspnea, hypoxia, requiring nonrebreather, initially in ED she did require BiPAP, she was intubated in ED, she tested positive for COVID-19, her work-up significant for volume overload as well, she was extubated 10/14/2018, she did require reintubation that same day    Subjective:    Emily Fry with no significant events overnight as discussed with staff.   Assessment  & Plan :    Active Problems:   Type 2 diabetes mellitus with hyperosmolar nonketotic hyperglycemia (HCC)   A-fib (HCC)   COPD (chronic obstructive pulmonary disease) (HCC)   Gout   Benign essential HTN   Acute respiratory failure with hypoxia (HCC)   Acute on chronic diastolic heart failure (HCC)   Morbid obesity (HCC)   Acute on chronic respiratory failure with hypoxia and hypercapnia (HCC)  Acute on chronic respiratory failure with hypercapnia -She is on baseline 2 to 3 L nasal cannula, in  ED she is in severe hypercapnia with metabolic acidosis . -Intubated in ED -Respiratory failure felt more likely due to volume overload from her CHF, more than actual lung injury from COVID-19 infection . -Vent management and sedation per PCCM, did tolerate pressure support very well yesterday, extubated, but then she become lethargic, obtunded and hypoxic, she required reintubation . -This morning she is doing  well on pressure support, continue with IV diuresis.  Plan to extubate today, and she will need BiPAP support(will be done via close circuit with vent machine)  COVID 19 infection -Respiratory failure currently more likely related to volume overload than lung injury from COVID-19 infection. -Her inflammatory markers trending up significantly,  < 0.8 admission, today is 14, as well her ferritin is trending up , 65 on admission, today is 398 , and her tenuous respiratory status, she will receive Actemra today , I have discussed this with his sister is agreeable to proceed , The treatment plan and use of medications and known side effects were discussed with patient/family, they were clearly explained that there is no proven definitive treatment for COVID-19 infection, any medications used here are based on published clinical articles/anecdotal data which are not peer-reviewed or randomized control trials.  Complete risks and long-term side effects are unknown, however in the best clinical judgment they seem to be of some clinical benefit rather than medical risks.  Patient/family agree with the treatment plan and want to receive the given medications.  Acute Metabolic Encephalopathy -in the Setting of hypercapnia, resolved  Parox atrial fibrillation -Heart rate was uncontrolled yesterday, resumed back on her metoprolol and Cardizem - continue with warfarin, pharmacy to dose  Acute on chronic diastolic CHF -Continue with IV diuresis, renal function remained stable, with Lasix 60 mg IV twice  daily, she is -1 L over last 24 hours, -5.8 L since admission  -The echo obtained this admission with a preserved EF  -Continue daily weight, strict ins and outs  -Please see discussion below regarding hypernatremia -Resumed home dose acetazolamide  CAD -Continue with home dose aspirin, continue with metoprolol  Diabetes mellitus type 2 -CBGs are controlled, will hold home dose Lantus, continue with insulin sliding scale  Hypokalemia -Repleting,  Hypophosphatemia -Resolved after replacement with Neutra-Phos  Hypernatremia -Rated at 150 today, this is most likely to diuresis, significantly volume overloaded, will continue with IV Lasix, but I will increase her D5W to 60 cc/h and increase her free water via PEG .    COVID-19 Labs  Recent Labs    10/14/18 0515 10/15/18 0323 10/16/18 0225  FERRITIN 132 149 398*  CRP 8.4* 12.3* 14.6*    Lab Results  Component Value Date   SARSCOV2NAA POSITIVE (A) 10/12/2018   [place] NEGATIVE 10/07/2018   SARSCOV2NAA NOT DETECTED 10/07/2018       Code Status : Full  Family Communication  : Disussed with sister via phone 10/15/2018.  Disposition Plan  : remains in ICU.  Consults  :  PCCM  Procedures  : intubated 10/12/2018  DVT Prophylaxis  :  On warfarin  Lab Results  Component Value Date   PLT 111 (L) 10/16/2018    Antibiotics  :   Anti-infectives (From admission, onward)   None        Objective:   Vitals:   10/16/18 1000 10/16/18 1030 10/16/18 1100 10/16/18 1150  BP: 115/65 96/84 (!) 113/91 116/89  Pulse: 93 96 94 (!) 104  Resp: (!) 26 (!) 27 (!) 34 (!) 30  Temp:      TempSrc:      SpO2: 95% 97% 98% 93%  Weight:      Height:        Wt Readings from Last 3 Encounters:  10/16/18 (!) 155.8 kg  10/07/18 (!) 150.6 kg  04/14/18 (!) 181.4 kg     Intake/Output Summary (Last 24 hours) at 10/16/2018 1317 Last data  filed at 10/16/2018 1100 Gross per 24 hour  Intake 1931.43 ml  Output 2780 ml  Net -848.57 ml       Physical Exam  Awake Alert, Oriented X 3, vented, on mild sedation, but sitting in bed, appropriate and communicative in no apparent distress Symmetrical Chest wall movement, Good air movement bilaterally, CTAB RRR,No Gallops,Rubs or new Murmurs, No Parasternal Heave +ve B.Sounds, Abd Soft, No tenderness, No rebound - guarding or rigidity. No Cyanosis, Clubbing, mild edema, No new Rash or bruise        Data Review:    CBC Recent Labs  Lab 10/12/18 0011 10/12/18 0433  10/13/18 0545 10/14/18 0515 10/14/18 1423 10/15/18 0323 10/16/18 0225  WBC 7.1 5.5  --  5.6 6.4  --  4.0 4.2  HGB 11.5* 11.4*   < > 10.7* 11.1* 11.6* 10.0* 10.1*  HCT 41.7 40.9   < > 36.4 41.5 34.0* 35.0* 36.5  PLT 171 164  --  166 148*  --  120* 111*  MCV 102.5* 101.2*  --  95.5 103.0*  --  100.3* 101.1*  MCH 28.3 28.2  --  28.1 27.5  --  28.7 28.0  MCHC 27.6* 27.9*  --  29.4* 26.7*  --  28.6* 27.7*  RDW 14.6 14.6  --  14.7 15.5  --  15.8* 15.9*  LYMPHSABS 0.6*  --   --   --   --   --   --   --   MONOABS 0.6  --   --   --   --   --   --   --   EOSABS 0.1  --   --   --   --   --   --   --   BASOSABS 0.0  --   --   --   --   --   --   --    < > = values in this interval not displayed.    Chemistries  Recent Labs  Lab 10/12/18 0011  10/12/18 1700  10/13/18 0545 10/13/18 1530 10/14/18 0515 10/14/18 1423 10/15/18 0323 10/16/18 0225  NA 141   < >  --    < > 142 144 146* 146* 149* 150*  K 4.6   < >  --    < > 2.7* 3.9 3.9 3.6 2.9* 4.3  CL 98  --   --   --  100 100 101  --  105 107  CO2 34*  --   --   --  34* 34* 35*  --  36* 37*  GLUCOSE 129*  --   --   --  112* 135* 117*  --  137* 200*  BUN 25*  --   --   --  44* 48* 64*  --  70* 75*  CREATININE 1.27*  --   --   --  1.12* 1.10* 1.35*  --  1.25* 1.25*  CALCIUM 9.1  --   --   --  8.8* 8.8* 8.3*  --  8.0* 8.3*  MG  --    < > 2.1  --  2.0 2.1  --   --  1.9 2.5*  AST 39  --   --   --   --   --   --   --   --   --   ALT 33  --   --   --   --   --    --   --   --   --  ALKPHOS 117  --   --   --   --   --   --   --   --   --   BILITOT 0.6  --   --   --   --   --   --   --   --   --    < > = values in this interval not displayed.   ------------------------------------------------------------------------------------------------------------------ No results for input(s): CHOL, HDL, LDLCALC, TRIG, CHOLHDL, LDLDIRECT in the last 72 hours.  Lab Results  Component Value Date   HGBA1C 11.9 (H) 08/25/2015   ------------------------------------------------------------------------------------------------------------------ No results for input(s): TSH, T4TOTAL, T3FREE, THYROIDAB in the last 72 hours.  Invalid input(s): FREET3 ------------------------------------------------------------------------------------------------------------------ Recent Labs    10/15/18 0323 10/16/18 0225  FERRITIN 149 398*    Coagulation profile Recent Labs  Lab 10/12/18 0544 10/13/18 0545 10/14/18 0515 10/15/18 0323 10/16/18 0225  INR 2.2* 1.8* 1.7* 2.1* 1.9*    No results for input(s): DDIMER in the last 72 hours.  Cardiac Enzymes Recent Labs  Lab 10/12/18 0011  TROPONINI <0.03   ------------------------------------------------------------------------------------------------------------------    Component Value Date/Time   BNP 201.2 (H) 10/12/2018 0011    Inpatient Medications  Scheduled Meds:  aspirin  81 mg Per Tube Daily   chlorhexidine gluconate (MEDLINE KIT)  15 mL Mouth Rinse BID   Chlorhexidine Gluconate Cloth  6 each Topical Daily   dexamethasone  4 mg Intravenous Q6H   diltiazem  30 mg Per Tube Q8H   free water  200 mL Per Tube Q4H   furosemide  60 mg Intravenous Q12H   insulin aspart  0-20 Units Subcutaneous Q4H   mouth rinse  15 mL Mouth Rinse 10 times per day   metoprolol tartrate  25 mg Per Tube BID   pantoprazole (PROTONIX) IV  40 mg Intravenous QHS   warfarin  7.5 mg Oral ONCE-1800   Warfarin -  Pharmacist Dosing Inpatient   Does not apply q1800   Continuous Infusions:  dextrose 5 % with kcl 60 mL/hr at 10/16/18 1100   tocilizumab (ACTEMRA) IV     PRN Meds:.Ipratropium-Albuterol, polyvinyl alcohol, sodium chloride flush  Micro Results Recent Results (from the past 240 hour(s))  SARS Coronavirus 2 [facility] order, Performed in [place] hospital lab)     Status: None   Collection Time: 10/07/18  6:45 PM  Result Value Ref Range Status   SARS Coronavirus 2 NEGATIVE NEGATIVE Final    Comment: (NOTE) If result is NEGATIVE SARS-CoV-2 target nucleic acids are NOT DETECTED. The SARS-CoV-2 RNA is generally detectable in upper and lower  respiratory specimens during the acute phase of infection. The lowest  concentration of SARS-CoV-2 viral copies this assay can detect is 250  copies / mL. A negative result does not preclude SARS-CoV-2 infection  and should not be used as the sole basis for treatment or other  patient management decisions.  A negative result may occur with  improper specimen collection / handling, submission of specimen other  than nasopharyngeal swab, presence of viral mutation(s) within the  areas targeted by this assay, and inadequate number of viral copies  (<250 copies / mL). A negative result must be combined with clinical  observations, patient history, and epidemiological information. If result is POSITIVE SARS-CoV-2 target nucleic acids are DETECTED. The SARS-CoV-2 RNA is generally detectable in upper and lower  respiratory specimens dur ing the acute phase of infection.  Positive  results are indicative of active infection with SARS-CoV-2.  Clinical  correlation with patient history and other diagnostic information is  necessary to determine patient infection status.  Positive results do  not rule out bacterial infection or co-infection with other viruses. If result is PRESUMPTIVE POSTIVE SARS-CoV-2 nucleic acids MAY BE PRESENT.   A presumptive  positive result was obtained on the submitted specimen  and confirmed on repeat testing.  While 2019 novel coronavirus  (SARS-CoV-2) nucleic acids may be present in the submitted sample  additional confirmatory testing may be necessary for epidemiological  and / or clinical management purposes  to differentiate between  SARS-CoV-2 and other Sarbecovirus currently known to infect humans.  If clinically indicated additional testing with an alternate test  methodology [phone]) is advised. The SARS-CoV-2 RNA is generally  detectable in upper and lower respiratory sp ecimens during the acute  phase of infection. The expected result is Negative. Fact Sheet for Patients:  StrictlyIdeas.no Fact Sheet for Healthcare Providers: BankingDealers.co.za This test is not yet approved or cleared by the [place] FDA and has been authorized for detection and/or diagnosis of SARS-CoV-2 by FDA under an Emergency Use Authorization (EUA).  This EUA will remain in effect (meaning this test can be used) for the duration of the COVID-19 declaration under Section 564(b)(1) of the Act, 21 U.S.C. section 360bbb-3(b)(1), unless the authorization is terminated or revoked sooner. Performed at [facility], [place] [address] [place], [place] [address]   Novel Coronavirus, NAA (hospital order; send-out to ref lab)     Status: None   Collection Time: 10/07/18  6:45 PM  Result Value Ref Range Status   SARS-CoV-2, NAA NOT DETECTED NOT DETECTED Final    Comment: (NOTE) This test was developed and its performance characteristics determined by [employer]. This test has not been FDA cleared or approved. This test has been authorized by FDA under an Emergency Use Authorization (EUA). This test has been validated in accordance with the FDA's Guidance Document (Policy for [place] in Laboratories Certified to Perform High Complexity Testing under  CLIA prior to Emergency Use Authorization for Coronavirus [id number] during the [facility] Emergency) issued on February 29th, 2020. FDA independent review of this validation is pending. This test is only authorized for the duration of time the declaration that circumstances exist justifying the authorization of the emergency use of in vitro diagnostic tests for detection of SARS-CoV- 2 virus and/or diagnosis of COVID-19 infection under section 564(b)(1) of the Act, 21 U.S.C. 030SPQ-3(R)(0), unless the authorization is terminated or revoked sooner. Performed At: [facility] [address] [place], [place] [id number] Rush Farmer MD LK:[id number]    [place]  Final    Comment: Performed at [facility], [place] [address], [place], [place] [address]  Respiratory Panel by PCR     Status: None   Collection Time: 10/07/18  6:45 PM  Result Value Ref Range Status   Adenovirus NOT DETECTED NOT DETECTED Final   Coronavirus 229E NOT DETECTED NOT DETECTED Final    Comment: (NOTE) The Coronavirus on the Respiratory Panel, DOES NOT test for the novel  Coronavirus (2019 nCoV)    Coronavirus HKU1 NOT DETECTED NOT DETECTED Final   Coronavirus NL63 NOT DETECTED NOT DETECTED Final   Coronavirus OC43 NOT DETECTED NOT DETECTED Final   Metapneumovirus NOT DETECTED NOT DETECTED Final   Rhinovirus / Enterovirus NOT DETECTED NOT DETECTED Final   Influenza A NOT DETECTED NOT DETECTED Final   Influenza B NOT DETECTED NOT DETECTED Final  Parainfluenza Virus 1 NOT DETECTED NOT DETECTED Final   Parainfluenza Virus 2 NOT DETECTED NOT DETECTED Final   Parainfluenza Virus 3 NOT DETECTED NOT DETECTED Final   Parainfluenza Virus 4 NOT DETECTED NOT DETECTED Final   Respiratory Syncytial Virus NOT DETECTED NOT DETECTED Final   Bordetella pertussis NOT DETECTED NOT DETECTED Final   Chlamydophila pneumoniae NOT DETECTED NOT DETECTED Final   Mycoplasma pneumoniae NOT  DETECTED NOT DETECTED Final    Comment: Performed at [facility], [place] [address], [place], [place] [address]  Culture, blood (routine x 2) Call MD if unable to obtain prior to antibiotics being given     Status: Abnormal   Collection Time: 10/07/18 11:45 PM  Result Value Ref Range Status   Specimen Description BLOOD RIGHT ARM  Final   Special Requests   Final    BOTTLES DRAWN AEROBIC AND ANAEROBIC Blood Culture results may not be optimal due to an excessive volume of blood received in culture bottles   Culture  Setup Time   Final    GRAM POSITIVE COCCI AEROBIC BOTTLE ONLY CRITICAL RESULT CALLED TO, READ BACK BY AND VERIFIED WITH: PHRMD J ORIET '@0354'  10/09/18 BY S GEZAHEGN    Culture (A)  Final    STAPHYLOCOCCUS SPECIES (COAGULASE NEGATIVE) THE SIGNIFICANCE OF ISOLATING THIS ORGANISM FROM A SINGLE VENIPUNCTURE CANNOT BE PREDICTED WITHOUT FURTHER CLINICAL AND CULTURE CORRELATION. SUSCEPTIBILITIES AVAILABLE ONLY ON REQUEST. Performed at [facility], [place] [address], [place], [place] [address]    Report Status 10/30/2018 FINAL  Final  Blood Culture ID Panel (Reflexed)     Status: Abnormal   Collection Time: 10/07/18 11:45 PM  Result Value Ref Range Status   Enterococcus species NOT DETECTED NOT DETECTED Final   Listeria monocytogenes NOT DETECTED NOT DETECTED Final   Staphylococcus species DETECTED (A) NOT DETECTED Final    Comment: Methicillin (oxacillin) resistant coagulase negative staphylococcus. Possible blood culture contaminant (unless isolated from more than one blood culture draw or clinical case suggests pathogenicity). No antibiotic treatment is indicated for blood  culture contaminants. CRITICAL RESULT CALLED TO, READ BACK BY AND VERIFIED WITH: PHRMD J ORIET '@0354'  10/09/18 BY S GEZAHEGN    Staphylococcus aureus (BCID) NOT DETECTED NOT DETECTED Final   Methicillin resistance DETECTED (A) NOT DETECTED Final    Comment: CRITICAL RESULT CALLED TO, READ BACK BY AND  VERIFIED WITH: PHRMD J ORIET '@0354'  10/09/18 BY S GEZAHEGN    Streptococcus species NOT DETECTED NOT DETECTED Final   Streptococcus agalactiae NOT DETECTED NOT DETECTED Final   Streptococcus pneumoniae NOT DETECTED NOT DETECTED Final   Streptococcus pyogenes NOT DETECTED NOT DETECTED Final   Acinetobacter baumannii NOT DETECTED NOT DETECTED Final   Enterobacteriaceae species NOT DETECTED NOT DETECTED Final   Enterobacter cloacae complex NOT DETECTED NOT DETECTED Final   Escherichia coli NOT DETECTED NOT DETECTED Final   Klebsiella oxytoca NOT DETECTED NOT DETECTED Final   Klebsiella pneumoniae NOT DETECTED NOT DETECTED Final   Proteus species NOT DETECTED NOT DETECTED Final   Serratia marcescens NOT DETECTED NOT DETECTED Final   Haemophilus influenzae NOT DETECTED NOT DETECTED Final   Neisseria meningitidis NOT DETECTED NOT DETECTED Final   Pseudomonas aeruginosa NOT DETECTED NOT DETECTED Final   Candida albicans NOT DETECTED NOT DETECTED Final   Candida glabrata NOT DETECTED NOT DETECTED Final   Candida krusei NOT DETECTED NOT DETECTED Final   Candida parapsilosis NOT DETECTED NOT DETECTED Final   Candida tropicalis NOT DETECTED NOT DETECTED Final  Comment: Performed at [facility], [place] [address], [place], [place] [address]  Culture, blood (routine x 2) Call MD if unable to obtain prior to antibiotics being given     Status: None   Collection Time: 10/08/18 12:08 AM  Result Value Ref Range Status   Specimen Description BLOOD RIGHT HAND  Final   Special Requests   Final    BOTTLES DRAWN AEROBIC ONLY Blood Culture adequate volume   Culture   Final    NO GROWTH 5 DAYS Performed at [facility], [place] [address], [place], [place] [address]    Report Status 10/13/2018 FINAL  Final  MRSA PCR Screening     Status: None   Collection Time: 10/08/18  1:06 AM  Result Value Ref Range Status   MRSA by PCR NEGATIVE NEGATIVE Final    Comment:        The GeneXpert MRSA Assay  (FDA approved for NASAL specimens only), is one component of a comprehensive MRSA colonization surveillance program. It is not intended to diagnose MRSA infection nor to guide or monitor treatment for MRSA infections. Performed at [facility], [place] [address] [place], [place] [address]   Urine culture     Status: Abnormal   Collection Time: 10/08/18  1:34 PM  Result Value Ref Range Status   Specimen Description URINE, CLEAN CATCH  Final   Special Requests   Final    Immunocompromised Performed at [facility], [place] [address], [place], [place] [address]    Culture MULTIPLE SPECIES PRESENT, SUGGEST RECOLLECTION (A)  Final   Report Status 10/09/2018 FINAL  Final  SARS Coronavirus 2 [facility] order, Performed in [place] hospital lab)     Status: Abnormal   Collection Time: 10/12/18 12:11 AM  Result Value Ref Range Status   SARS Coronavirus 2 POSITIVE (A) NEGATIVE Final    Comment: RESULT CALLED TO, READ BACK BY AND VERIFIED WITH: K MUNNET '@[id number]'  10/12/18 BY S GEZAHEGN (NOTE) If result is NEGATIVE SARS-CoV-2 target nucleic acids are NOT DETECTED. The SARS-CoV-2 RNA is generally detectable in upper and lower  respiratory specimens during the acute phase of infection. The lowest  concentration of SARS-CoV-2 viral copies this assay can detect is 250  copies / mL. A negative result does not preclude SARS-CoV-2 infection  and should not be used as the sole basis for treatment or other  patient management decisions.  A negative result may occur with  improper specimen collection / handling, submission of specimen other  than nasopharyngeal swab, presence of viral mutation(s) within the  areas targeted by this assay, and inadequate number of viral copies  (<250 copies / mL). A negative result must be combined with clinical  observations, patient history, and epidemiological information. If result is POSITIVE SARS-CoV-2 target nucleic acids are DETECTED. T he SARS-CoV-2  RNA is generally detectable in upper and lower  respiratory specimens during the acute phase of infection.  Positive  results are indicative of active infection with SARS-CoV-2.  Clinical  correlation with patient history and other diagnostic information is  necessary to determine patient infection status.  Positive results do  not rule out bacterial infection or co-infection with other viruses. If result is PRESUMPTIVE POSTIVE SARS-CoV-2 nucleic acids MAY BE PRESENT.   A presumptive positive result was obtained on the submitted specimen  and confirmed on repeat testing.  While 2019 novel coronavirus  (SARS-CoV-2) nucleic acids may be present in the submitted sample  additional  confirmatory testing may be necessary for epidemiological  and / or clinical management purposes  to differentiate between  SARS-CoV-2 and other Sarbecovirus currently known to infect humans.  If clinically indicated additional testing with an alternate test  methodology [phone]) is  advised. The SARS-CoV-2 RNA is generally  detectable in upper and lower respiratory specimens during the acute  phase of infection. The expected result is Negative. Fact Sheet for Patients:  StrictlyIdeas.no Fact Sheet for Healthcare Providers: BankingDealers.co.za This test is not yet approved or cleared by the [place] FDA and has been authorized for detection and/or diagnosis of SARS-CoV-2 by FDA under an Emergency Use Authorization (EUA).  This EUA will remain in effect (meaning this test can be used) for the duration of the COVID-19 declaration under Section 564(b)(1) of the Act, 21 U.S.C. section 360bbb-3(b)(1), unless the authorization is terminated or revoked sooner. Performed at [facility], [place] [address], [place], [place] [address]   Blood Culture (routine x 2)     Status: None (Preliminary result)   Collection Time: 10/12/18 12:11 AM  Result Value Ref Range  Status   Specimen Description BLOOD RIGHT HAND  Final   Special Requests   Final    BOTTLES DRAWN AEROBIC AND ANAEROBIC Blood Culture results may not be optimal due to an inadequate volume of blood received in culture bottles   Culture   Final    NO GROWTH 4 DAYS Performed at [facility], [place] [address] [place], [place] [address]    Report Status PENDING  Incomplete  Blood Culture (routine x 2)     Status: None (Preliminary result)   Collection Time: 10/12/18 12:32 AM  Result Value Ref Range Status   Specimen Description BLOOD SITE NOT SPECIFIED  Final   Special Requests   Final    BOTTLES DRAWN AEROBIC AND ANAEROBIC Blood Culture results may not be optimal due to an inadequate volume of blood received in culture bottles   Culture   Final    NO GROWTH 4 DAYS Performed at [facility], [place] [address] [place], [place] [address]    Report Status PENDING  Incomplete  Culture, respiratory (non-expectorated)     Status: None   Collection Time: 10/12/18  8:15 AM  Result Value Ref Range Status   Specimen Description TRACHEAL ASPIRATE  Final   Special Requests NONE  Final   Gram Stain   Final    RARE WBC PRESENT, PREDOMINANTLY PMN NO ORGANISMS SEEN    Culture   Final    RARE Consistent with normal respiratory flora. Performed at [facility], [place] [address], [place], [place] [address]    Report Status 10/14/2018 FINAL  Final    Radiology Reports Dg Abd 1 View  Result Date: 10/14/2018 CLINICAL DATA:  Feeding tube placement EXAM: ABDOMEN - 1 VIEW COMPARISON:  10/12/2018 FINDINGS: The tip of the enteric tube projects over the duodenum. The tip is pointed distally. The bowel gas pattern is nonspecific. IMPRESSION: Tip of the enteric tube projects over the duodenum. Electronically Signed   By: Constance Holster M.D.   On: 10/14/2018 15:03   Dg Abd 1 View  Result Date: 10/12/2018 CLINICAL DATA:  Enteric tube placement. EXAM: ABDOMEN - 1 VIEW COMPARISON:  CT abdomen  pelvis dated May 08, 2014. FINDINGS: Enteric tube tip in the stomach. The visualized bowel gas pattern is normal. No radio-opaque calculi or other significant radiographic abnormality are seen. No acute osseous abnormality. IMPRESSION:  1. Enteric tube in the stomach. Electronically Signed   By: Titus Dubin M.D.   On: 10/12/2018 06:24   Ct Head Wo Contrast  Result Date: 10/07/2018 CLINICAL DATA:  Seizure.  Bilateral hand twitching EXAM: CT HEAD WITHOUT CONTRAST TECHNIQUE: Contiguous axial images were obtained from the base of the skull through the vertex without intravenous contrast. COMPARISON:  None. FINDINGS: Brain: Ventricle size and cerebral volume normal. Negative for acute infarct. Negative for hemorrhage or mass. Vascular: Negative for hyperdense vessel Skull: Negative Sinuses/Orbits: Bilateral exophthalmos. No orbital mass. Paranasal sinuses clear. Other: None IMPRESSION: No acute intracranial abnormality Exophthalmos. Electronically Signed   By: Franchot Gallo M.D.   On: 10/07/2018 17:10   Dg Chest Port 1 View  Result Date: 10/15/2018 CLINICAL DATA:  Hypoxia EXAM: PORTABLE CHEST 1 VIEW COMPARISON:  Yesterday FINDINGS: Endotracheal tube tip between the clavicular heads and carina. The orogastric tube at least reaches the stomach. Bilateral airspace disease without convincing change from prior. Cardiomegaly. No definite effusion. No pneumothorax. New PICC on the left with tip near the SVC origin. IMPRESSION: 1. New PICC with tip near the SVC origin. 2. Endotracheal and orogastric tubes in expected position. 3. Cardiomegaly and bilateral airspace disease without significant change from prior. Electronically Signed   By: Monte Fantasia M.D.   On: 10/15/2018 11:12   Dg Chest Port 1 View  Result Date: 10/14/2018 CLINICAL DATA:  Viral pneumonia EXAM: PORTABLE CHEST 1 VIEW COMPARISON:  Chest x-ray dated 10/13/2018 FINDINGS: The endotracheal tube terminates above the carina. Enteric tube extends  below the left hemidiaphragm. The cardiac silhouette is significantly enlarged. Diffuse bilateral hazy airspace opacities are again noted. There is no pneumothorax. There may be small bilateral pleural effusions. IMPRESSION: No significant interval change. Electronically Signed   By: Constance Holster M.D.   On: 10/14/2018 15:02   Dg Chest Port 1 View  Result Date: 10/13/2018 CLINICAL DATA:  Intubation EXAM: PORTABLE CHEST 1 VIEW COMPARISON:  Yesterday FINDINGS: Endotracheal tube tip between the clavicular heads and carina. The orogastric tube at least reaches the stomach. Cardiomegaly and vascular pedicle widening. Low lung volumes with diffuse pulmonary opacification that is similar to prior. No air leak or definite/increasing pleural fluid. IMPRESSION: Unchanged hardware positioning and bilateral airspace disease. Electronically Signed   By: Monte Fantasia M.D.   On: 10/13/2018 08:04   Dg Chest Port 1 View  Result Date: 10/12/2018 CLINICAL DATA:  Endotracheal tube placement. EXAM: PORTABLE CHEST 1 VIEW COMPARISON:  Chest x-ray from same day at 3:36 a.m. FINDINGS: Endotracheal tube in place with the tip now 8 mm above the carina. Enteric tube in the stomach. Stable cardiomegaly. Persistent low lung volumes with moderate pulmonary edema and small bilateral pleural effusions. Slightly improved aeration at the right lung base. Persistent dense retrocardiac opacification, likely atelectasis. No pneumothorax. No acute osseous abnormality. IMPRESSION: 1. Endotracheal tube tip now 8 mm above the carina. Consider retracting an additional 2 cm. 2. Unchanged congestive heart failure. Slightly improved aeration at the right lung base. Electronically Signed   By: Titus Dubin M.D.   On: 10/12/2018 06:28   Dg Chest Port 1 View  Result Date: 10/12/2018 CLINICAL DATA:  Follow-up examination status post endotracheal tube repositioning. EXAM: PORTABLE CHEST 1 VIEW COMPARISON:  Prior radiograph earlier same day.  FINDINGS: Tip of the endotracheal tube still remains deep within the right mainstem bronchus. Retraction by approximately 3.5 cm recommended. Remainder of the examination remains unchanged. IMPRESSION: Tip of endotracheal tube remains deep within  the right mainstem bronchus. Retraction by approximately 3-3.5 cm recommended. Critical Value/emergent results were called by telephone at the time of interpretation on 10/12/2018 at 4:51 am to Dr. Delora Fuel , who verbally acknowledged these results. Electronically Signed   By: Jeannine Boga M.D.   On: 10/12/2018 04:51   Dg Chest Port 1 View  Result Date: 10/12/2018 CLINICAL DATA:  Initial evaluation for intubation. EXAM: PORTABLE CHEST 1 VIEW COMPARISON:  Prior radiograph from earlier the same day. FINDINGS: Endotracheal tube in place, position deep with tip in the right mainstem bronchus. At time of this dictation, a second film has been obtained. Tip still appears to be within the right mainstem bronchus. Enteric tube courses into the abdomen. Stable cardiomegaly. Low lung volumes. Pulmonary edema with bilateral pleural effusions again seen, similar to previous. Probable superimposed bibasilar atelectasis. No pneumothorax. Osseous structures unchanged. IMPRESSION: 1. Tip of endotracheal tube positioned within the right mainstem bronchus. At time of this dictation, a second filled with a repositioned tube has already been acquired. 2. Diffuse pulmonary edema and small bilateral pleural effusions, suggesting CHF. 3. Probable superimposed bibasilar atelectasis. 4. Electronically Signed   By: Jeannine Boga M.D.   On: 10/12/2018 04:47   Dg Chest Port 1 View  Result Date: 10/12/2018 CLINICAL DATA:  Altered mental status, shortness of breath EXAM: PORTABLE CHEST 1 VIEW COMPARISON:  10/07/2018 FINDINGS: Cardiomegaly. Diffuse bilateral airspace disease, favor edema/CHF. Suspect layering effusions. Very low lung volumes. No acute bony abnormality. IMPRESSION:  Cardiomegaly with severe diffuse bilateral airspace disease and suspected layering effusions, likely edema/CHF. Low lung volumes. Electronically Signed   By: Rolm Baptise M.D.   On: 10/12/2018 01:01   Dg Chest Portable 1 View  Result Date: 10/07/2018 CLINICAL DATA:  Tremor.  Cough. EXAM: PORTABLE CHEST 1 VIEW COMPARISON:  04/14/2018 and 05/17/2008 FINDINGS: There is abnormal increased density at the left lung base, new since the prior study. Heart size and pulmonary vascularity are normal. There is a new small focal area of linear atelectasis in the right upper lobe. No effusions. IMPRESSION: Area of new infiltrate at the left lung base. Minimal atelectasis in the right upper lobe. Electronically Signed   By: Lorriane Shire M.D.   On: 10/07/2018 17:14   [place] Ekg Site Rite  Result Date: 10/14/2018 If Site Rite image not attached, placement could not be confirmed due to current cardiac rhythm.   Phillips Climes M.D on 10/16/2018 at 1:17 PM  Between 7am to 7pm - Pager - [phone]  After 7pm go to [email or web address] - password [facility]  [facility] -  Office  [phone]

## 2018-10-16 NOTE — Progress Notes (Signed)
Wasted 240cc of fentanyl gtt down the sink with Heloise Beecham, RN.

## 2018-10-16 NOTE — Progress Notes (Signed)
ANTICOAGULATION CONSULT NOTE - Follow Up Consult  Pharmacy Consult for Warfarin Indication: atrial fibrillation  Allergies  Allergen Reactions  . Contrast Media [Iodinated Diagnostic Agents] Swelling  . Tomato Other (See Comments)    unknow per MAR  . Amoxicillin Diarrhea, Itching and Other (See Comments)    Has patient had a PCN reaction causing immediate rash, facial/tongue/throat swelling, SOB or lightheadedness with hypotension: No Has patient had a PCN reaction causing severe rash involving mucus membranes or skin necrosis: No Has patient had a PCN reaction that required hospitalization: No Has patient had a PCN reaction occurring within the last 10 years: Yes If all of the above answers are "NO", then may proceed with Cephalosporin use.     Patient Measurements: Height: '5\' 4"'  (162.6 cm) Weight: (!) 343 lb 7.6 oz (155.8 kg) IBW/kg (Calculated) : 54.7  Vital Signs: Temp: 99.1 F (37.3 C) (05/07 0400) Temp Source: Axillary (05/07 0000) BP: 97/62 (05/07 0600) Pulse Rate: 91 (05/07 0600)  Labs: Recent Labs    10/14/18 0515 10/14/18 1423 10/15/18 0323 10/16/18 0225  HGB 11.1* 11.6* 10.0* 10.1*  HCT 41.5 34.0* 35.0* 36.5  PLT 148*  --  120* 111*  LABPROT 20.1*  --  23.3* 21.1*  INR 1.7*  --  2.1* 1.9*  CREATININE 1.35*  --  1.25* 1.25*    Estimated Creatinine Clearance: 71.9 mL/min (A) (by C-G formula based on SCr of 1.25 mg/dL (H)).   Medications:  Scheduled:  . acetaZOLAMIDE  250 mg Per Tube BID  . aspirin  81 mg Per Tube Daily  . chlorhexidine gluconate (MEDLINE KIT)  15 mL Mouth Rinse BID  . Chlorhexidine Gluconate Cloth  6 each Topical Daily  . dexamethasone  4 mg Intravenous Q6H  . diltiazem  30 mg Per Tube Q8H  . feeding supplement (PRO-STAT SUGAR FREE 64)  30 mL Per Tube TID  . free water  200 mL Per Tube Q4H  . furosemide  60 mg Intravenous Q12H  . insulin aspart  0-9 Units Subcutaneous Q4H  . mouth rinse  15 mL Mouth Rinse 10 times per day  .  metoprolol tartrate  25 mg Per Tube BID  . pantoprazole (PROTONIX) IV  40 mg Intravenous QHS  . Warfarin - Pharmacist Dosing Inpatient   Does not apply q1800    Assessment: 88 YOF with h/o Afib on warfarin admitted to ICU with hypercarbic respiratory failure, +COVID-19.  Pharmacy consulted to resume warfarin. INR on admission is therapeutic at 2.2.  Home warfarin regimen: 6 mg on Mon/Wed/Fri; 5 mg on other days   Today, 10/16/2018:  INR decreased to 1.9, slightly subtherapeutic - Note missed dose on 5/2 (last home dose on 5/1, admitted 5/3) CBC: Hgb 10.1, Plt decreased to 111 No bleeding or complications reported. No major drug-drug interactions noted.  Pt continues on Aspirin 67m daily.   Goal of Therapy:  INR 2-3 Monitor platelets by anticoagulation protocol: Yes   Plan:  Warfarin 7.5 mg PO x1 at 18:00. Daily PT/INR. Monitor for signs and symptoms of bleeding.   CGretta ArabPharmD, BCPS 10/16/2018 7:43 AM

## 2018-10-16 NOTE — Progress Notes (Signed)
Occupational Therapy Evaluation Patient Details Name: Emily Fry MRN: [id number] DOB: [birth year] Today's Date: 10/16/2018    History of Present Illness 63 year old female came from [facility] nursing home with increasing shortness of breath and hypoxemia/COVID 19 on Oct 12, 2018.  Required intubation in the emergency room. Extubated 5/5 and reintubated. Extubated 5/7. FUX:NATF, obstructive sleep apnea, chronic respiratory failure with hypoxemia, A. fib, allergic rhinitis, bilateral osteoarthritis of knee, diastolic heart failure, gastroesophageal reflux disease, gout, hypertension, morbid obesity.    Clinical Impression   PTA, pt at [facility] and required assistance with ADL and per pt was not walking. Pt states she spent "a lot of time in the bed". Per chart review, pt was living independently in November of 2019. Pt seen after extubation on 15L non-rebreather and transferred to recliner with use of Maximove and +2 A. VSS and documented in flowsheet. Pt very interested in eating. Will follow acutely to maximize functional level of independence and facilitate safe DC to SNF.     Follow Up Recommendations  SNF;Supervision/Assistance - 24 hour    Equipment Recommendations  None recommended by OT    Recommendations for Other Services       Precautions / Restrictions Precautions Precautions: Fall Precaution Comments: monitor vitals      Mobility Bed Mobility Overal bed mobility: Needs Assistance Bed Mobility: Rolling Rolling: +2 for physical assistance;Max assist         General bed mobility comments: Pt assists with rolling with heavy use of bed rail  Transfers Overall transfer level: Needs assistance                    Balance                                           ADL either performed or assessed with clinical judgement   ADL Overall ADL's : Needs assistance/impaired Eating/Feeding: NPO   Grooming: Minimal assistance;Sitting   Upper Body  Bathing: Moderate assistance;Sitting   Lower Body Bathing: Maximal assistance;Bed level   Upper Body Dressing : Moderate assistance;Sitting   Lower Body Dressing: Total assistance;Bed level               Functional mobility during ADLs: Total assistance;+2 for physical assistance(with use of Maximove)       Vision         Perception     Praxis      Pertinent Vitals/Pain Pain Assessment: No/denies pain     Hand Dominance Right   Extremity/Trunk Assessment Upper Extremity Assessment Upper Extremity Assessment: Generalized weakness   Lower Extremity Assessment Lower Extremity Assessment: Defer to PT evaluation   Cervical / Trunk Assessment Cervical / Trunk Assessment: Other exceptions Cervical / Trunk Exceptions: body habitus   Communication     Cognition Arousal/Alertness: Awake/alert Behavior During Therapy: WFL for tasks assessed/performed Overall Cognitive Status: No family/caregiver present to determine baseline cognitive functioning                                 General Comments: Following commands; not oriented to month; impulsivein responses at times; slow processing   General Comments       Exercises     Shoulder Instructions      Home Living Family/patient expects to be discharged to:: Skilled nursing facility  Prior Functioning/Environment Level of Independence: Needs assistance  Gait / Transfers Assistance Needed: not ambulating at SNF; was living independently in November of 2019 ADL's / Homemaking Assistance Needed: requires assistance from staff; pt bathes what she can reach            OT Problem List: Decreased strength;Decreased activity tolerance;Decreased safety awareness;Obesity      OT Treatment/Interventions: Self-care/ADL training;Therapeutic exercise;Energy conservation;DME and/or AE instruction;Therapeutic activities;Patient/family education    OT  Goals(Current goals can be found in the care plan section) Acute Rehab OT Goals Patient Stated Goal: to get something to eat OT Goal Formulation: With patient Time For Goal Achievement: 10/30/18 Potential to Achieve Goals: Good  OT Frequency: Min 2X/week   Barriers to D/C:            Co-evaluation PT/OT/SLP Co-Evaluation/Treatment: Yes Reason for Co-Treatment: Complexity of the patient's impairments (multi-system involvement);For patient/therapist safety;To address functional/ADL transfers   OT goals addressed during session: ADL's and self-care;Strengthening/ROM      AM-PAC OT "6 Clicks" Daily Activity     Outcome Measure Help from another person eating meals?: A Little Help from another person taking care of personal grooming?: A Lot Help from another person toileting, which includes using toliet, bedpan, or urinal?: Total Help from another person bathing (including washing, rinsing, drying)?: A Lot Help from another person to put on and taking off regular upper body clothing?: A Lot Help from another person to put on and taking off regular lower body clothing?: Total 6 Click Score: 11   End of Session Equipment Utilized During Treatment: Oxygen(15L non-rebreather) Nurse Communication: Mobility status;Need for lift equipment  Activity Tolerance: Patient tolerated treatment well Patient left: in chair;with call bell/phone within reach;with nursing/sitter in room  OT Visit Diagnosis: Other abnormalities of gait and mobility (R26.89);Muscle weakness (generalized) (M62.81)                Time: 6286-3817 OT Time Calculation (min): 32 min Charges:  OT General Charges $OT Visit: 1 Visit OT Evaluation $OT Eval Moderate Complexity: 1 Mod  Antuan Limes, OT/L   Acute OT Clinical Specialist Acute Rehabilitation Services Pager [phone] Office [phone]   [facility] 10/16/2018, 5:54 PM

## 2018-10-16 NOTE — Progress Notes (Signed)
Pt extubated to 100% NRB sat 90%. Patient able to state her name, speaking fine and good strong cough.  Attempted 15 L High flow however pt desat to 50's.  Placed back on 100% NRB.  Will continue to monitor.

## 2018-10-16 NOTE — Evaluation (Signed)
Physical Therapy Evaluation Patient Details Name: Alea Rodes MRN: [id number] DOB: Sep 22, 1955 Today's Date: 10/16/2018   History of Present Illness  63 year old female [facility] from [facility] with increasing shortness of breath and hypoxemia/COVID 19 on Oct 12, 2018.  Required intubation in the emergency room. Extubated 5/5 and reintubated. Extubated 5/7. FTD:DUKG, obstructive sleep apnea, chronic respiratory failure with hypoxemia, A. fib, allergic rhinitis, bilateral osteoarthritis of knee, diastolic heart failure, gastroesophageal reflux disease, gout, hypertension, morbid obesity.   Clinical Impression  PT co session with OT to mobilize pt OOB to the recliner chair using the maxi move lift.  Pt tolerated well with NRB mask and VSS.  Pt was from SNF and will need to return to SNF at discharge.  Last record we have of her standing was 04/2018 and she was two person max assist.   PT to follow acutely for deficits listed below.      Follow Up Recommendations SNF    Equipment Recommendations  None recommended by PT    Recommendations for Other Services   NA    Precautions / Restrictions Precautions Precautions: Fall Precaution Comments: monitor vitals      Mobility  Bed Mobility Overal bed mobility: Needs Assistance Bed Mobility: Rolling Rolling: +2 for physical assistance;Max assist         General bed mobility comments: Pt assists with rolling with heavy use of bed rail  Transfers Overall transfer level: Needs assistance               General transfer comment: total lift OOB to chair.  Pt tolerated move well with VSS.                 Pertinent Vitals/Pain Pain Assessment: No/denies pain    Home Living Family/patient expects to be discharged to:: Skilled nursing facility                      Prior Function Level of Independence: Needs assistance   Gait / Transfers Assistance Needed: not ambulating at SNF; was living independently in  November of 2019  ADL's / Homemaking Assistance Needed: requires assistance from staff; pt bathes what she can reach        Hand Dominance   Dominant Hand: Right    Extremity/Trunk Assessment   Upper Extremity Assessment Upper Extremity Assessment: Defer to OT evaluation    Lower Extremity Assessment Lower Extremity Assessment: RLE deficits/detail;LLE deficits/detail RLE Deficits / Details: bil LE preferred positioning bil hip ER, actively moves ankles.  unable to lift against gravity, but body habitus may also be part of that.  LLE Deficits / Details: bil LE preferred positioning bil hip ER, actively moves ankles.  unable to lift against gravity, but body habitus may also be part of that.     Cervical / Trunk Assessment Cervical / Trunk Assessment: Other exceptions Cervical / Trunk Exceptions: body habitus  Communication   Communication: No difficulties  Cognition Arousal/Alertness: Awake/alert Behavior During Therapy: WFL for tasks assessed/performed Overall Cognitive Status: No family/caregiver present to determine baseline cognitive functioning                                 General Comments: Following commands; not oriented to month; impulsivein responses at times; slow processing             Assessment/Plan    PT Assessment Patient needs continued PT services  PT Problem List  Decreased strength;Decreased range of motion;Decreased activity tolerance;Decreased balance;Decreased mobility;Decreased coordination;Decreased knowledge of use of DME;Decreased knowledge of precautions;Cardiopulmonary status limiting activity;Obesity;Pain       PT Treatment Interventions DME instruction;Functional mobility training;Therapeutic activities;Therapeutic exercise;Balance training;Cognitive remediation;Patient/family education    PT Goals (Current goals can be found in the Care Plan section)  Acute Rehab PT Goals Patient Stated Goal: to get something to eat PT  Goal Formulation: With patient Time For Goal Achievement: 10/30/18 Potential to Achieve Goals: Fair    Frequency Min 3X/week        Co-evaluation PT/OT/SLP Co-Evaluation/Treatment: Yes Reason for Co-Treatment: Complexity of the patient's impairments (multi-system involvement);Necessary to address cognition/behavior during functional activity;For patient/therapist safety;To address functional/ADL transfers PT goals addressed during session: Mobility/safety with mobility;Strengthening/ROM;Proper use of DME OT goals addressed during session: ADL's and self-care;Strengthening/ROM       AM-PAC PT "6 Clicks" Mobility  Outcome Measure Help needed turning from your back to your side while in a flat bed without using bedrails?: Total Help needed moving from lying on your back to sitting on the side of a flat bed without using bedrails?: Total Help needed moving to and from a bed to a chair (including a wheelchair)?: Total Help needed standing up from a chair using your arms (e.g., wheelchair or bedside chair)?: Total Help needed to walk in hospital room?: Total Help needed climbing 3-5 steps with a railing? : Total 6 Click Score: 6    End of Session Equipment Utilized During Treatment: Oxygen(NRB) Activity Tolerance: Patient limited by fatigue Patient left: in chair;with call bell/phone within reach   PT Visit Diagnosis: Muscle weakness (generalized) (M62.81);Difficulty in walking, not elsewhere classified (R26.2)    Time: 6063-0160 PT Time Calculation (min) (ACUTE ONLY): 32 min   Charges:        Lurena Joiner B. Melinda Pottinger, PT, DPT  Acute Rehabilitation [phone] pager #[phone] office   PT Evaluation $PT Eval Moderate Complexity: 1 Mod          10/16/2018, 6:35 PM

## 2018-10-16 NOTE — Progress Notes (Signed)
Spoke with pt's sister via telephone and updated her on the pt's plan of care for the day. Sister would like to have a video chat with the pt at 2030 tonight. Will let night shift RN know.

## 2018-10-17 DIAGNOSIS — D539 Nutritional anemia, unspecified: Secondary | ICD-10-CM

## 2018-10-17 DIAGNOSIS — I10 Essential (primary) hypertension: Secondary | ICD-10-CM

## 2018-10-17 DIAGNOSIS — E1100 Type 2 diabetes mellitus with hyperosmolarity without nonketotic hyperglycemic-hyperosmolar coma (NKHHC): Secondary | ICD-10-CM

## 2018-10-17 LAB — COMPREHENSIVE METABOLIC PANEL
ALT: 35 U/L (ref 0–44)
AST: 62 U/L — ABNORMAL HIGH (ref 15–41)
Albumin: 2.6 g/dL — ABNORMAL LOW (ref 3.5–5.0)
Alkaline Phosphatase: 75 U/L (ref 38–126)
Anion gap: 8 (ref 5–15)
BUN: 70 mg/dL — ABNORMAL HIGH (ref 6–20)
CO2: 36 mmol/L — ABNORMAL HIGH (ref 22–32)
Calcium: 8.8 mg/dL — ABNORMAL LOW (ref 8.9–10.3)
Chloride: 104 mmol/L (ref 98–111)
Creatinine, Ser: 1.22 mg/dL — ABNORMAL HIGH (ref 0.44–1.00)
GFR calc Af Amer: 56 mL/min — ABNORMAL LOW (ref >60)
GFR calc non Af Amer: 48 mL/min — ABNORMAL LOW (ref >60)
Glucose, Bld: 165 mg/dL — ABNORMAL HIGH (ref 70–99)
Potassium: 4.4 mmol/L (ref 3.5–5.1)
Sodium: 148 mmol/L — ABNORMAL HIGH (ref 135–145)
Total Bilirubin: 0.3 mg/dL (ref 0.3–1.2)
Total Protein: 6 g/dL — ABNORMAL LOW (ref 6.5–8.1)

## 2018-10-17 LAB — CULTURE, BLOOD (ROUTINE X 2)
Culture: NO GROWTH
Culture: NO GROWTH

## 2018-10-17 LAB — MAGNESIUM: Magnesium: 2.5 mg/dL — ABNORMAL HIGH (ref 1.7–2.4)

## 2018-10-17 LAB — CBC
HCT: 37.1 % (ref 36.0–46.0)
Hemoglobin: 10.4 g/dL — ABNORMAL LOW (ref 12.0–15.0)
MCH: 28.3 pg (ref 26.0–34.0)
MCHC: 28 g/dL — ABNORMAL LOW (ref 30.0–36.0)
MCV: 101.1 fL — ABNORMAL HIGH (ref 80.0–100.0)
Platelets: 128 10*3/uL — ABNORMAL LOW (ref 150–400)
RBC: 3.67 MIL/uL — ABNORMAL LOW (ref 3.87–5.11)
RDW: 15.5 % (ref 11.5–15.5)
WBC: 5.2 10*3/uL (ref 4.0–10.5)
nRBC: 0.8 % — ABNORMAL HIGH (ref 0.0–0.2)

## 2018-10-17 LAB — GLUCOSE, CAPILLARY
Glucose-Capillary: 153 mg/dL — ABNORMAL HIGH (ref 70–99)
Glucose-Capillary: 158 mg/dL — ABNORMAL HIGH (ref 70–99)
Glucose-Capillary: 230 mg/dL — ABNORMAL HIGH (ref 70–99)
Glucose-Capillary: 127 mg/dL — ABNORMAL HIGH (ref 70–99)
Glucose-Capillary: 163 mg/dL — ABNORMAL HIGH (ref 70–99)

## 2018-10-17 LAB — PROTIME-INR
INR: 2 — ABNORMAL HIGH (ref 0.8–1.2)
Prothrombin Time: 22.5 seconds — ABNORMAL HIGH (ref 11.4–15.2)

## 2018-10-17 LAB — TYPE AND SCREEN
ABO/RH(D): A NEG
Antibody Screen: NEGATIVE

## 2018-10-17 LAB — C-REACTIVE PROTEIN: CRP: 6.5 mg/dL — ABNORMAL HIGH (ref <1.0)

## 2018-10-17 LAB — FERRITIN: Ferritin: 451 ng/mL — ABNORMAL HIGH (ref 11–307)

## 2018-10-17 LAB — ABO/RH: ABO/RH(D): A NEG

## 2018-10-17 MED ORDER — ENSURE ENLIVE PO LIQD
237.0000 mL | Freq: Two times a day (BID) | ORAL | Status: DC
  Administered 2018-10-17: 14:00:00 237 mL via ORAL
  Filled 2018-10-17 (×5): qty 237

## 2018-10-17 MED ORDER — POLYETHYLENE GLYCOL 3350 17 G PO PACK
17.0000 g | PACK | Freq: Every day | ORAL | Status: DC
  Filled 2018-10-17: qty 1

## 2018-10-17 MED ORDER — DILTIAZEM HCL 30 MG PO TABS
30.0000 mg | ORAL_TABLET | Freq: Three times a day (TID) | ORAL | Status: DC
  Administered 2018-10-17 – 2018-10-18 (×3): 30 mg via ORAL
  Filled 2018-10-17 (×7): qty 1

## 2018-10-17 MED ORDER — WARFARIN SODIUM 7.5 MG PO TABS
7.5000 mg | ORAL_TABLET | Freq: Once | ORAL | Status: AC
  Administered 2018-10-17: 17:00:00 7.5 mg via ORAL
  Filled 2018-10-17: qty 1

## 2018-10-17 MED ORDER — PANTOPRAZOLE SODIUM 40 MG PO TBEC
40.0000 mg | DELAYED_RELEASE_TABLET | Freq: Every day | ORAL | Status: DC
  Administered 2018-10-17: 40 mg via ORAL
  Filled 2018-10-17: qty 1

## 2018-10-17 MED ORDER — TOCILIZUMAB 400 MG/20ML IV SOLN
800.0000 mg | Freq: Once | INTRAVENOUS | Status: AC
  Administered 2018-10-17: 10:00:00 800 mg via INTRAVENOUS
  Filled 2018-10-17: qty 40

## 2018-10-17 MED ORDER — ASPIRIN 81 MG PO CHEW
81.0000 mg | CHEWABLE_TABLET | Freq: Every day | ORAL | Status: DC
  Administered 2018-10-18: 08:00:00 81 mg via ORAL
  Filled 2018-10-17: qty 1

## 2018-10-17 MED ORDER — METOPROLOL TARTRATE 25 MG PO TABS
25.0000 mg | ORAL_TABLET | Freq: Two times a day (BID) | ORAL | Status: DC
  Administered 2018-10-17 – 2018-10-18 (×2): 25 mg via ORAL
  Filled 2018-10-17 (×2): qty 1

## 2018-10-17 MED ORDER — SODIUM CHLORIDE 0.9% IV SOLUTION: Freq: Once | INTRAVENOUS | Status: DC

## 2018-10-17 MED ORDER — DOCUSATE SODIUM 100 MG PO CAPS
100.0000 mg | ORAL_CAPSULE | Freq: Every day | ORAL | Status: DC
  Administered 2018-10-17 – 2018-10-18 (×2): 100 mg via ORAL
  Filled 2018-10-17 (×2): qty 1

## 2018-10-17 NOTE — Progress Notes (Signed)
Attempted to place patient on BIPAP during the night. Pt unable to tolerate mask and flow.  Pt back to NRB with sats of 90-95%.  Monitored thru the night with the BIPAP on stand-by if needed.

## 2018-10-17 NOTE — Progress Notes (Addendum)
Physical Therapy Treatment Patient Details Name: Emily Fry MRN: [id number] DOB: [birth year] Today's Date: 10/17/2018    History of Present Illness 63 year old female came from collapse nursing home with increasing shortness of breath and hypoxemia/COVID 19 on Oct 12, 2018.  Required intubation in the emergency room. Extubated 5/5 and reintubated. Extubated 5/7. OZH:YQMV, obstructive sleep apnea, chronic respiratory failure with hypoxemia, A. fib, allergic rhinitis, bilateral osteoarthritis of knee, diastolic heart failure, gastroesophageal reflux disease, gout, hypertension, morbid obesity.     PT Comments    Pt tolerated transfer to chair via maximove. Pt anxious and required frequent reassurance. Pt on nonrebreather with SpO2 high 70's to 90.    Follow Up Recommendations  SNF     Equipment Recommendations  None recommended by PT    Recommendations for Other Services       Precautions / Restrictions Precautions Precautions: Fall Precaution Comments: monitor vitals    Mobility  Bed Mobility Overal bed mobility: Needs Assistance Bed Mobility: Rolling Rolling: +2 for physical assistance;Max assist         General bed mobility comments: Assist to bring shoulders and hips over  Transfers Overall transfer level: Needs assistance               General transfer comment: Tolerated with VSS  Ambulation/Gait                 Stairs             Wheelchair Mobility    Modified Rankin (Stroke Patients Only)       Balance                                            Cognition Arousal/Alertness: Awake/alert Behavior During Therapy: Anxious Overall Cognitive Status: No family/caregiver present to determine baseline cognitive functioning Area of Impairment: Problem solving;Following commands                       Following Commands: Follows one step commands consistently;Follows one step commands with increased time      Problem Solving: Slow processing        Exercises      General Comments        Pertinent Vitals/Pain Pain Assessment: No/denies pain    Home Living                      Prior Function            PT Goals (current goals can now be found in the care plan section) Progress towards PT goals: Progressing toward goals    Frequency    Min 3X/week      PT Plan Current plan remains appropriate    Co-evaluation PT/OT/SLP Co-Evaluation/Treatment: Yes            AM-PAC PT "6 Clicks" Mobility   Outcome Measure  Help needed turning from your back to your side while in a flat bed without using bedrails?: Total Help needed moving from lying on your back to sitting on the side of a flat bed without using bedrails?: Total Help needed moving to and from a bed to a chair (including a wheelchair)?: Total Help needed standing up from a chair using your arms (e.g., wheelchair or bedside chair)?: Total Help needed to walk in hospital room?: Total Help needed climbing 3-5 steps with a  railing? : Total 6 Click Score: 6    End of Session Equipment Utilized During Treatment: Oxygen(NRB) Activity Tolerance: Patient limited by fatigue Patient left: in chair;with nursing/sitter in room Nurse Communication: Mobility status;Need for lift equipment PT Visit Diagnosis: Muscle weakness (generalized) (M62.81);Difficulty in walking, not elsewhere classified (R26.2)     Time: 6629-4765 PT Time Calculation (min) (ACUTE ONLY): 25 min  Charges:  $Therapeutic Activity: 23-37 mins                     [facility] PT Acute Rehabilitation Services Pager [phone] Office [phone]    Angelina Ok [facility] 10/17/2018, 2:39 PM

## 2018-10-17 NOTE — Progress Notes (Signed)
ANTICOAGULATION CONSULT NOTE - Follow Up Consult  Pharmacy Consult for Warfarin Indication: atrial fibrillation  Allergies  Allergen Reactions  . Contrast Media [Iodinated Diagnostic Agents] Swelling  . Tomato Other (See Comments)    unknow per MAR  . Amoxicillin Diarrhea, Itching and Other (See Comments)    Has patient had a PCN reaction causing immediate rash, facial/tongue/throat swelling, SOB or lightheadedness with hypotension: No Has patient had a PCN reaction causing severe rash involving mucus membranes or skin necrosis: No Has patient had a PCN reaction that required hospitalization: No Has patient had a PCN reaction occurring within the last 10 years: Yes If all of the above answers are "NO", then may proceed with Cephalosporin use.     Patient Measurements: Height: '5\' 4"'  (162.6 cm) Weight: (!) 332 lb 0.2 oz (150.6 kg) IBW/kg (Calculated) : 54.7  Vital Signs: Temp: 98 F (36.7 C) (05/08 0400) Temp Source: Oral (05/08 0400) BP: 112/69 (05/08 0600) Pulse Rate: 74 (05/08 0600)  Labs: Recent Labs    10/15/18 0323 10/16/18 0225 10/17/18 0224  HGB 10.0* 10.1* 10.4*  HCT 35.0* 36.5 37.1  PLT 120* 111* 128*  LABPROT 23.3* 21.1* 22.5*  INR 2.1* 1.9* 2.0*  CREATININE 1.25* 1.25* 1.22*    Estimated Creatinine Clearance: 72.1 mL/min (A) (by C-G formula based on SCr of 1.22 mg/dL (H)).   Medications:  Scheduled:  . aspirin  81 mg Per Tube Daily  . chlorhexidine gluconate (MEDLINE KIT)  15 mL Mouth Rinse BID  . Chlorhexidine Gluconate Cloth  6 each Topical Daily  . diltiazem  30 mg Per Tube Q8H  . docusate  100 mg Per Tube Daily  . furosemide  60 mg Intravenous Q12H  . insulin aspart  0-20 Units Subcutaneous Q4H  . mouth rinse  15 mL Mouth Rinse BID  . metoprolol tartrate  25 mg Per Tube BID  . pantoprazole (PROTONIX) IV  40 mg Intravenous QHS  . polyethylene glycol  17 g Per Tube Daily  . Warfarin - Pharmacist Dosing Inpatient   Does not apply q1800     Assessment: 55 YOF with h/o Afib on warfarin admitted to ICU with hypercarbic respiratory failure, +COVID-19.  Pharmacy consulted to resume warfarin. INR on admission is therapeutic at 2.2.  Home warfarin regimen: 6 mg on Mon/Wed/Fri; 5 mg on other days   Today, 10/17/2018:  INR decreased to 2, therapeutic - Note missed dose on 5/2 (last home dose on 5/1, admitted 5/3) CBC: Hgb and Plt remain low/stable. No bleeding or complications reported. No major drug-drug interactions noted.  Pt continues on Aspirin 2m daily.   Goal of Therapy:  INR 2-3 Monitor platelets by anticoagulation protocol: Yes   Plan:  Warfarin 7.5 mg PO x1 at 18:00. Daily PT/INR. Monitor for signs and symptoms of bleeding.   CGretta ArabPharmD, BCPS 10/17/2018 8:06 AM

## 2018-10-17 NOTE — Progress Notes (Signed)
Nutrition Follow-up  DOCUMENTATION CODES:   Morbid obesity  INTERVENTION:   Ensure Enlive po BID, each supplement provides 350 kcal and 20 grams of protein  Recommend liberalizing diet if po intake inadequate on follow-up   NUTRITION DIAGNOSIS:   Inadequate oral intake related to inability to eat as evidenced by NPO status.  Being addressed as diet advanced post extubation, supplements  GOAL:   Patient will meet greater than or equal to 90% of their needs  Progressing  MONITOR:   PO intake, Supplement acceptance, Vent status, Labs, Weight trends, Skin  REASON FOR ASSESSMENT:   Ventilator, Consult Enteral/tube feeding initiation and management  ASSESSMENT:   63 year old female with history of chronic respiratory failure, COPD on 2L O2 at baseline, OSA, morbid obesity, Afib on coumadin, diastolic heart failure, GERD, and HTN. Patient was recently hospitalized (4/28-4/30) d/t acute on chronic hypercarbic hypoxic respiratory failure. She had 2 negative COVID tests during that admission. Patient presented to the ED from [facility] with 1 day hx of increasing SOB and hypoxia requiring non-rebreather and then BiPAP. She was later intubated while still in the ED. COVID-19 testing done in the ED and was positive.   5/07 Extubated, diet advanced to Heart Health Carb mod  Pt ate 50% at dinner last night, appetite is fair  Constipated; No BM since 5/3. Colace 100 mg daily, miralax 17 g daily ordered today  Current wt 150.6 kg; admission weight 150.9 kg. Net negative per I/O flow sheet. 1-2+ edema on exam  Labs: sodium 148, Creatinine 1.22, BUN 70 Meds: lasix, colace, miralax  Diet Order:   Diet Order            Diet heart healthy/carb modified Room service appropriate? Yes; Fluid consistency: Thin; Fluid restriction: Other (see comments)  Diet effective now              EDUCATION NEEDS:   No education needs have been identified at this time  Skin:  Skin Assessment:  Reviewed RN Assessment  Last BM:  5/3  Height:   Ht Readings from Last 1 Encounters:  10/12/18 5\' 4"  (1.626 m)    Weight:   Wt Readings from Last 1 Encounters:  10/17/18 (!) 150.6 kg    Ideal Body Weight:  54.5 kg  BMI:  Body mass index is 56.99 kg/m.  Estimated Nutritional Needs:   Kcal:  2000-2200 kcals   Protein:  110-130 g   Fluid:  >/= 1.8 L/day   Romelle Starcher MS, RD, LDN, CNSC [phone] Pager  [phone] Weekend/On-Call Pager

## 2018-10-17 NOTE — Progress Notes (Signed)
Patient has had no output of urine. Bladder scanned patient, .  Spoke to Dr. Robb Matar about patient not producing urine. He ordered foley cath be inserted. Reinserted foley cath.

## 2018-10-17 NOTE — Research (Signed)
IMax Fickle, MD, consented Subject Emily Fry (female, Date of Birth 09-Feb-1956, 63 y.o.) and with diagnosis of COVID-19, in the [facility] Expanded Access Program (EAP) Research Protocol for [employer] against COVID-19.  The consent took place under following circumstances.   Subject Capacity assessed by this investigator as:  Presence of adequate emotional and mental capacity to consent with normal ability to read and write.  Consent took place in the following setting(s):  In-room, face to face   The following were present for the consent process:  Investigator   A copy of the cover letter and signed consent document was provided to subject/LAR.  The original signed consent document has been placed in the subject's physical chart and will be scanned into the electronic medical record upon discharge.  Statement of acknowledgement that the following was discussed with the subject/LAR:    1) Discussed the purpose of the research and procedures  2) Discussed risks and benefits and uncertainties of study participation 3) Discussed subject's responsibilities  4) Discussed the measures in place to maintain subject's confidentiality while a participant on the trial  5) Discussed alternatives to study participation.   6) Discussed study participation is voluntary and that the subject's care would not be jeopardized if they declined participation in the study.   7) Discussed freedom to withdraw at any time.   8) All subject/LAR questions were answered to their satisfaction.   9) In case of emergency consent, investigator agreed to discuss with subject/LAR at earliest available opportunity when the subject stabilizes and/or LAR can be located.     Final Investigator Signature  Cathleen Fears [place], [place] [phone]   Date: 10/17/2018 and 4:33 PM

## 2018-10-17 NOTE — Progress Notes (Signed)
Speech Pathology: cancellation note:  Pt has been started on PO diet; RN reports doing well with no s/s of aspiration.  Our service will sign off.  Please re-order SLP should pt demonstrate swallowing deficits. Thank you.  Tabia Landowski L. Samson Frederic, MA CCC/SLP Acute Rehabilitation Services Office number [phone] Pager [phone]

## 2018-10-17 NOTE — Progress Notes (Signed)
Called patient's sister and gave her updates on patient. Answered her questions regarding her sister. Patient's sister spoke with patient about Plasma infusion. She encouraged patient to get plasma. Patient told her sister if she thought it would be a good idea to receive plasma she would do it. Patient has agreed to sign consent to receive plasma

## 2018-10-17 NOTE — Progress Notes (Signed)
NAME:  Emily Fry, MRN:  [id number], DOB:  Oct 11, 1955, LOS: 5 ADMISSION DATE:  2018-11-01, CONSULTATION DATE:  5/6 REFERRING MD:  Dr. Preston Fleeting, CHIEF COMPLAINT:  Dyspnea   Brief History   63 year old female came from collapse nursing home with increasing shortness of breath and hypoxemia/COVID 19 on Oct 12, 2018.  Required intubation in the emergency room.   Past Medical History  COPD, obstructive sleep apnea, chronic respiratory failure with hypoxemia, A. fib, allergic rhinitis, bilateral osteoarthritis of knee, diastolic heart failure, gastroesophageal reflux disease, gout, hypertension, morbid obesity  Significant Hospital Events   May 2 admitted May 5 extubated, required reintubation for hypoxemia, hypercarbia  Consults:    Procedures:  May 3 ETT > 5/5, 5/5 > 5/7  Significant Diagnostic Tests:    Micro Data:  5/3 BCx 2 >> 5/3 COVID 19 >> POSITIVE 5/3 trach asp >>  Antimicrobials/COVID  May 7 Actemra  Interim history/subjective:   Remains extubated Says she feels fine Slept poorly Did not tolerate BiPAP Quite hypoxemic No cough No chest congestion Received Actemra yesterday  Objective   Blood pressure 112/69, pulse 74, temperature 98 F (36.7 C), temperature source Oral, resp. rate 19, height 5\' 4"  (1.626 m), weight (!) 150.6 kg, SpO2 94 %.    Vent Mode: PSV;CPAP FiO2 (%):  [40 %-100 %] 100 % PEEP:  [5 cmH20] 5 cmH20 Pressure Support:  [5 cmH20] 5 cmH20   Intake/Output Summary (Last 24 hours) at 10/17/2018 0745 Last data filed at 10/17/2018 0600 Gross per 24 hour  Intake 1752.06 ml  Output 1700 ml  Net 52.06 ml   Filed Weights   10/15/18 0500 10/16/18 0500 10/17/18 0404  Weight: (!) 154.2 kg (!) 155.8 kg (!) 150.6 kg    Examination:  General:  Resting comfortably in bed HENT: NCAT OP clear PULM: CTA B, normal effort CV: RRR, no mgr GI: BS+, soft, nontender MSK: normal bulk and tone Neuro: awake, alert, no distress, MAEW    Resolved Hospital  Problem list     Assessment & Plan:  Acute respiratory failure with hypoxemia due to acute pulmonary edema from diastolic heart failure exacerbation: Heart failure exacerbation has resolved, I am now concerned that she has COVID-19 pneumonia Continue diuresis Sit up in chair as long as tolerated Aspiration precautions Diet as tolerated Administer O2 to maintain O2 saturation greater than 88%   Chronic hypercarbic respiratory failure due to obesity hypoventilation syndrome Continue to offer BiPAP nightly, patient currently refusing.   COVID-19: With worsening oxygenation I am concerned that she has COVID pneumonia Administer Actemra again today Have discussed convalescent plasma worth her at length, will start consent process  Maintain in ICU  Best practice:  Diet: regular diet Pain/Anxiety/Delirium protocol (if indicated): d/c VAP protocol (if indicated): n/a DVT prophylaxis: warfarin GI prophylaxis: PPI Glucose control: SSI Mobility: PT consult, out of bed Code Status: full Family Communication: TRH to discuss with sister Annice Pih today Disposition: remain in ICU  Labs   CBC: Recent Labs  Lab 10/12/18 0011  10/13/18 0545 10/14/18 0515 10/14/18 1423 10/15/18 0323 10/16/18 0225 10/17/18 0224  WBC 7.1   < > 5.6 6.4  --  4.0 4.2 5.2  NEUTROABS 5.7  --   --   --   --   --   --   --   HGB 11.5*   < > 10.7* 11.1* 11.6* 10.0* 10.1* 10.4*  HCT 41.7   < > 36.4 41.5 34.0* 35.0* 36.5 37.1  MCV 102.5*   < >  95.5 103.0*  --  100.3* 101.1* 101.1*  PLT 171   < > 166 148*  --  120* 111* 128*   < > = values in this interval not displayed.    Basic Metabolic Panel: Recent Labs  Lab 10/12/18 1142 10/12/18 1700  10/13/18 0545 10/13/18 1530 10/14/18 0515 10/14/18 1423 10/15/18 0323 10/16/18 0225 10/17/18 0224  NA  --   --    < > 142 144 146* 146* 149* 150* 148*  K  --   --    < > 2.7* 3.9 3.9 3.6 2.9* 4.3 4.4  CL  --   --   --  100 100 101  --  105 107 104  CO2  --   --    --  34* 34* 35*  --  36* 37* 36*  GLUCOSE  --   --   --  112* 135* 117*  --  137* 200* 165*  BUN  --   --   --  44* 48* 64*  --  70* 75* 70*  CREATININE  --   --   --  1.12* 1.10* 1.35*  --  1.25* 1.25* 1.22*  CALCIUM  --   --   --  8.8* 8.8* 8.3*  --  8.0* 8.3* 8.8*  MG 2.7* 2.1  --  2.0 2.1  --   --  1.9 2.5* 2.5*  PHOS 1.8* 1.2*  --  2.1*  --   --   --  3.1  --   --    < > = values in this interval not displayed.   GFR: Estimated Creatinine Clearance: 72.1 mL/min (A) (by C-G formula based on SCr of 1.22 mg/dL (H)). Recent Labs  Lab 10/12/18 0011 10/12/18 0433  10/14/18 0515 10/15/18 0323 10/16/18 0225 10/17/18 0224  PROCALCITON 0.16  --   --   --   --   --   --   WBC 7.1 5.5   < > 6.4 4.0 4.2 5.2  LATICACIDVEN 0.5 0.9  --   --   --   --   --    < > = values in this interval not displayed.    Liver Function Tests: Recent Labs  Lab 10/12/18 0011 10/17/18 0224  AST 39 62*  ALT 33 35  ALKPHOS 117 75  BILITOT 0.6 0.3  PROT 6.7 6.0*  ALBUMIN 3.1* 2.6*   No results for input(s): LIPASE, AMYLASE in the last 168 hours. No results for input(s): AMMONIA in the last 168 hours.  ABG    Component Value Date/Time   PHART 7.365 10/14/2018 1423   PCO2ART 64.1 (H) 10/14/2018 1423   PO2ART 62.0 (L) 10/14/2018 1423   HCO3 36.6 (H) 10/14/2018 1423   TCO2 39 (H) 10/14/2018 1423   O2SAT 90.0 10/14/2018 1423     Coagulation Profile: Recent Labs  Lab 10/13/18 0545 10/14/18 0515 10/15/18 0323 10/16/18 0225 10/17/18 0224  INR 1.8* 1.7* 2.1* 1.9* 2.0*    Cardiac Enzymes: Recent Labs  Lab 10/12/18 0011  TROPONINI <0.03    HbA1C: Hgb A1c MFr Bld  Date/Time Value Ref Range Status  08/25/2015 02:14 AM 11.9 (H) 4.0 - 6.0 % Final    CBG: Recent Labs  Lab 10/16/18 1253 10/16/18 1709 10/16/18 2006 10/16/18 2302 10/17/18 0321  GLUCAP 141* 148* 188* 198* 153*     Critical care time: 31 minutes     Heber Kincaid, MD [place] [facility] Pager: [phone] Cell:  [phone] If no response,  call [phone]

## 2018-10-17 NOTE — Progress Notes (Signed)
[facility] PROGRESS NOTE    Progress Note  Panagiota Perfetti  WJX:[id number] DOB: Jul 11, 1955 DOA: 10/29/2018 PCP: Kirk Ruths, MD     Brief Narrative:   Genevia Bouldin is an 63 y.o. female past medical history of chronic respiratory failure, COPD on 2 L nasal cannula, obstructive sleep apnea with morbid obesity, atrial fibrillation on Coumadin, chronic diastolic heart failure who was transferred to the ED from collapse nursing home facility with increased shortness of breath and hypoxia recently discharged from the hospital on 10/09/2018 for acute on chronic hypercarbic and hypoxic respiratory failure with 2- COVID test. Presents to the ED with dyspnea that required BiPAP initially and intubated in the ED, her SARS-CoV-2 PCR was positive on this admission.  Status post extubation on 10/14/2018  Assessment/Plan:   Acute on chronic respiratory failure with hypoxia setting of COVID-19 viral infection: At baseline she is on 2 to 3 L nasal cannula. Intubated in the ED status post extubation on 10/14/2018 Respiratory distress is felt to be more likely due to volume overload than from acute lung injury She is currently off sedation COVID-19 Labs  Recent Labs    10/15/18 0323 10/16/18 0225 10/17/18 0224  FERRITIN 149 398* 451*  CRP 12.3* 14.6* 6.5*    Lab Results  Component Value Date   SARSCOV2NAA POSITIVE (A) 10/12/2018   [place] NEGATIVE 10/07/2018   [place] NOT DETECTED 10/07/2018    She status post Actemra on 07/12/3084  Acute metabolic encephalopathy: Likely due to hypercarbia now resolved.  Chronic atrial fibrillation on Coumadin: It was resume her metoprolol and Cardizem currently rate controlled.  Continue Coumadin per pharmacy.  Acute on chronic diastolic heart failure: Going back through her weight her weight hangs around estimate of 150 kg. She is close to about 6 L negative her volume status is hard to evaluate due to her body habitus. Her  creatinine has remained stable continue IV Lasix.  CAD: Continue aspirin and metoprolol.  Type 2 diabetes mellitus with hyperosmolar nonketotic hyperglycemia (HCC) Glucose fairly controlled, she is just on sliding scale continue current regimen.  Hypokalemia: Potassium has been repleted and has remained above 4.  Hypophosphatemia: Resolved after repletion.  Hypernatremia: Sodium is improving, on IV Lasix continue to trend.   DVT prophylaxis: lovenxo Family Communication:none Disposition Plan/Barrier to D/C: unable to determine Code Status:     Code Status Orders  (From admission, onward)         Start     Ordered   10/12/18 0353  Full code  Continuous     10/12/18 0357        Code Status History    Date Active Date Inactive Code Status Order ID Comments User Context   10/07/2018 2307 10/09/2018 1819 Full Code [id number]  Wilber Oliphant, MD ED   04/14/2018 2246 04/16/2018 1832 Full Code [id number]  Gladstone Lighter, MD Inpatient   08/25/2015 0914 08/28/2015 2013 Full Code [id number]  Saundra Shelling, MD Inpatient        IV Access:    Peripheral IV   Procedures and diagnostic studies:   Dg Chest Port 1 View  Result Date: 10/15/2018 CLINICAL DATA:  Hypoxia EXAM: PORTABLE CHEST 1 VIEW COMPARISON:  Yesterday FINDINGS: Endotracheal tube tip between the clavicular heads and carina. The orogastric tube at least reaches the stomach. Bilateral airspace disease without convincing change from prior. Cardiomegaly. No definite effusion. No pneumothorax. New PICC on the left with tip near the SVC origin. IMPRESSION: 1. New PICC with tip near  the SVC origin. 2. Endotracheal and orogastric tubes in expected position. 3. Cardiomegaly and bilateral airspace disease without significant change from prior. Electronically Signed   By: Monte Fantasia M.D.   On: 10/15/2018 11:12     Medical Consultants:    None.  Anti-Infectives:   None  Subjective:    [name] she relates  her breathing is about the same as yesterday she is hungry.  Objective:    Vitals:   10/17/18 0400 10/17/18 0404 10/17/18 0500 10/17/18 0600  BP: 117/70  113/84 112/69  Pulse: 78  82 74  Resp: '16  19 19  ' Temp: 98 F (36.7 C)     TempSrc: Oral     SpO2: 90%  92% 94%  Weight:  (!) 150.6 kg    Height:        Intake/Output Summary (Last 24 hours) at 10/17/2018 0809 Last data filed at 10/17/2018 0600 Gross per 24 hour  Intake 1709.76 ml  Output 1700 ml  Net 9.76 ml   Filed Weights   10/15/18 0500 10/16/18 0500 10/17/18 0404  Weight: (!) 154.2 kg (!) 155.8 kg (!) 150.6 kg    Exam: General exam: In no acute distress laying comfortably in bed Respiratory system: Good air movement and clear to auscultation. Cardiovascular system: Normal rate and rhythm with positive S1-S2 no murmurs rubs gallops. Gastrointestinal system: Positive bowel sounds soft nontender nondistended Central nervous system: Wake alert oriented x3 Extremities: No lower extremity edema Skin: No ulceration Psychiatry: Judgment judgment and insight appear normal.     Data Reviewed:    Labs: Basic Metabolic Panel: Recent Labs  Lab 10/12/18 1142 10/12/18 1700  10/13/18 0545 10/13/18 1530 10/14/18 0515 10/14/18 1423 10/15/18 0323 10/16/18 0225 10/17/18 0224  NA  --   --    < > 142 144 146* 146* 149* 150* 148*  K  --   --    < > 2.7* 3.9 3.9 3.6 2.9* 4.3 4.4  CL  --   --   --  100 100 101  --  105 107 104  CO2  --   --   --  34* 34* 35*  --  36* 37* 36*  GLUCOSE  --   --   --  112* 135* 117*  --  137* 200* 165*  BUN  --   --   --  44* 48* 64*  --  70* 75* 70*  CREATININE  --   --   --  1.12* 1.10* 1.35*  --  1.25* 1.25* 1.22*  CALCIUM  --   --   --  8.8* 8.8* 8.3*  --  8.0* 8.3* 8.8*  MG 2.7* 2.1  --  2.0 2.1  --   --  1.9 2.5* 2.5*  PHOS 1.8* 1.2*  --  2.1*  --   --   --  3.1  --   --    < > = values in this interval not displayed.   GFR Estimated Creatinine Clearance: 72.1 mL/min (A) (by C-G  formula based on SCr of 1.22 mg/dL (H)). Liver Function Tests: Recent Labs  Lab 10/12/18 0011 10/17/18 0224  AST 39 62*  ALT 33 35  ALKPHOS 117 75  BILITOT 0.6 0.3  PROT 6.7 6.0*  ALBUMIN 3.1* 2.6*   No results for input(s): LIPASE, AMYLASE in the last 168 hours. No results for input(s): AMMONIA in the last 168 hours. Coagulation profile Recent Labs  Lab 10/13/18 0545 10/14/18 0515 10/15/18 4709  10/16/18 0225 10/17/18 0224  INR 1.8* 1.7* 2.1* 1.9* 2.0*   COVID-19 Labs  Recent Labs    10/15/18 0323 10/16/18 0225 10/17/18 0224  FERRITIN 149 398* 451*  CRP 12.3* 14.6* 6.5*    Lab Results  Component Value Date   SARSCOV2NAA POSITIVE (A) 10/12/2018   [place] NEGATIVE 10/07/2018   SARSCOV2NAA NOT DETECTED 10/07/2018    CBC: Recent Labs  Lab 10/12/18 0011  10/13/18 0545 10/14/18 0515 10/14/18 1423 10/15/18 0323 10/16/18 0225 10/17/18 0224  WBC 7.1   < > 5.6 6.4  --  4.0 4.2 5.2  NEUTROABS 5.7  --   --   --   --   --   --   --   HGB 11.5*   < > 10.7* 11.1* 11.6* 10.0* 10.1* 10.4*  HCT 41.7   < > 36.4 41.5 34.0* 35.0* 36.5 37.1  MCV 102.5*   < > 95.5 103.0*  --  100.3* 101.1* 101.1*  PLT 171   < > 166 148*  --  120* 111* 128*   < > = values in this interval not displayed.   Cardiac Enzymes: Recent Labs  Lab 10/12/18 0011  TROPONINI <0.03   BNP (last 3 results) No results for input(s): PROBNP in the last 8760 hours. CBG: Recent Labs  Lab 10/16/18 1253 10/16/18 1709 10/16/18 2006 10/16/18 2302 10/17/18 0321  GLUCAP 141* 148* 188* 198* 153*   D-Dimer: No results for input(s): DDIMER in the last 72 hours. Hgb A1c: No results for input(s): HGBA1C in the last 72 hours. Lipid Profile: No results for input(s): CHOL, HDL, LDLCALC, TRIG, CHOLHDL, LDLDIRECT in the last 72 hours. Thyroid function studies: No results for input(s): TSH, T4TOTAL, T3FREE, THYROIDAB in the last 72 hours.  Invalid input(s): FREET3 Anemia work up: Recent Labs     10/16/18 0225 10/17/18 0224  FERRITIN 398* 451*   Sepsis Labs: Recent Labs  Lab 10/12/18 0011 10/12/18 0433  10/14/18 0515 10/15/18 0323 10/16/18 0225 10/17/18 0224  PROCALCITON 0.16  --   --   --   --   --   --   WBC 7.1 5.5   < > 6.4 4.0 4.2 5.2  LATICACIDVEN 0.5 0.9  --   --   --   --   --    < > = values in this interval not displayed.   Microbiology Recent Results (from the past 240 hour(s))  SARS Coronavirus 2 [facility] order, Performed in [facility] hospital lab)     Status: None   Collection Time: 10/07/18  6:45 PM  Result Value Ref Range Status   SARS Coronavirus 2 NEGATIVE NEGATIVE Final    Comment: (NOTE) If result is NEGATIVE SARS-CoV-2 target nucleic acids are NOT DETECTED. The SARS-CoV-2 RNA is generally detectable in upper and lower  respiratory specimens during the acute phase of infection. The lowest  concentration of SARS-CoV-2 viral copies this assay can detect is 250  copies / mL. A negative result does not preclude SARS-CoV-2 infection  and should not be used as the sole basis for treatment or other  patient management decisions.  A negative result may occur with  improper specimen collection / handling, submission of specimen other  than nasopharyngeal swab, presence of viral mutation(s) within the  areas targeted by this assay, and inadequate number of viral copies  (<250 copies / mL). A negative result must be combined with clinical  observations, patient history, and epidemiological information. If result is POSITIVE SARS-CoV-2 target  nucleic acids are DETECTED. The SARS-CoV-2 RNA is generally detectable in upper and lower  respiratory specimens dur ing the acute phase of infection.  Positive  results are indicative of active infection with SARS-CoV-2.  Clinical  correlation with patient history and other diagnostic information is  necessary to determine patient infection status.  Positive results do  not rule out bacterial infection or  co-infection with other viruses. If result is PRESUMPTIVE POSTIVE SARS-CoV-2 nucleic acids MAY BE PRESENT.   A presumptive positive result was obtained on the submitted specimen  and confirmed on repeat testing.  While 2019 novel coronavirus  (SARS-CoV-2) nucleic acids may be present in the submitted sample  additional confirmatory testing may be necessary for epidemiological  and / or clinical management purposes  to differentiate between  SARS-CoV-2 and other Sarbecovirus currently known to infect humans.  If clinically indicated additional testing with an alternate test  methodology [phone]) is advised. The SARS-CoV-2 RNA is generally  detectable in upper and lower respiratory sp ecimens during the acute  phase of infection. The expected result is Negative. Fact Sheet for Patients:  StrictlyIdeas.no Fact Sheet for Healthcare Providers: BankingDealers.co.za This test is not yet approved or cleared by the [place] FDA and has been authorized for detection and/or diagnosis of SARS-CoV-2 by FDA under an Emergency Use Authorization (EUA).  This EUA will remain in effect (meaning this test can be used) for the duration of the COVID-19 declaration under Section 564(b)(1) of the Act, 21 U.S.C. section 360bbb-3(b)(1), unless the authorization is terminated or revoked sooner. Performed at [facility], [place] [address], [place], [place] [address]   Novel Coronavirus, NAA (hospital order; send-out to ref lab)     Status: None   Collection Time: 10/07/18  6:45 PM  Result Value Ref Range Status   SARS-CoV-2, NAA NOT DETECTED NOT DETECTED Final    Comment: (NOTE) This test was developed and its performance characteristics determined by [employer]. This test has not been FDA cleared or approved. This test has been authorized by FDA under an Emergency Use Authorization (EUA). This test has been validated in accordance with the  FDA's Guidance Document (Policy for [place] in Laboratories Certified to Perform High Complexity Testing under CLIA prior to Emergency Use Authorization for Coronavirus [id number] during the [facility] Emergency) issued on February 29th, 2020. FDA independent review of this validation is pending. This test is only authorized for the duration of time the declaration that circumstances exist justifying the authorization of the emergency use of in vitro diagnostic tests for detection of SARS-CoV- 2 virus and/or diagnosis of COVID-19 infection under section 564(b)(1) of the Act, 21 U.S.C. 008QPY-1(P)(5), unless the authorization is terminated or revoked sooner. Performed At: [facility] [address] [place], [place] [id number] Rush Farmer MD PY:[id number]    [place]  Final    Comment: Performed at [facility], [place] [address], [place],  [address]  Respiratory Panel by PCR     Status: None   Collection Time: 10/07/18  6:45 PM  Result Value Ref Range Status   Adenovirus NOT DETECTED NOT DETECTED Final   Coronavirus 229E NOT DETECTED NOT DETECTED Final    Comment: (NOTE) The Coronavirus on the Respiratory Panel, DOES NOT test for the novel  Coronavirus (2019 nCoV)    Coronavirus HKU1 NOT DETECTED NOT DETECTED Final   Coronavirus NL63 NOT DETECTED NOT DETECTED Final   Coronavirus OC43 NOT DETECTED NOT DETECTED Final  Metapneumovirus NOT DETECTED NOT DETECTED Final   Rhinovirus / Enterovirus NOT DETECTED NOT DETECTED Final   Influenza A NOT DETECTED NOT DETECTED Final   Influenza B NOT DETECTED NOT DETECTED Final   Parainfluenza Virus 1 NOT DETECTED NOT DETECTED Final   Parainfluenza Virus 2 NOT DETECTED NOT DETECTED Final   Parainfluenza Virus 3 NOT DETECTED NOT DETECTED Final   Parainfluenza Virus 4 NOT DETECTED NOT DETECTED Final   Respiratory Syncytial Virus NOT DETECTED NOT DETECTED Final   Bordetella pertussis  NOT DETECTED NOT DETECTED Final   Chlamydophila pneumoniae NOT DETECTED NOT DETECTED Final   Mycoplasma pneumoniae NOT DETECTED NOT DETECTED Final    Comment: Performed at [facility], [place] [address] [place], [place] [address]  Culture, blood (routine x 2) Call MD if unable to obtain prior to antibiotics being given     Status: Abnormal   Collection Time: 10/07/18 11:45 PM  Result Value Ref Range Status   Specimen Description BLOOD RIGHT ARM  Final   Special Requests   Final    BOTTLES DRAWN AEROBIC AND ANAEROBIC Blood Culture results may not be optimal due to an excessive volume of blood received in culture bottles   Culture  Setup Time   Final    GRAM POSITIVE COCCI AEROBIC BOTTLE ONLY CRITICAL RESULT CALLED TO, READ BACK BY AND VERIFIED WITH: PHRMD J ORIET '@0354'  10/09/18 BY S GEZAHEGN    Culture (A)  Final    STAPHYLOCOCCUS SPECIES (COAGULASE NEGATIVE) THE SIGNIFICANCE OF ISOLATING THIS ORGANISM FROM A SINGLE VENIPUNCTURE CANNOT BE PREDICTED WITHOUT FURTHER CLINICAL AND CULTURE CORRELATION. SUSCEPTIBILITIES AVAILABLE ONLY ON REQUEST. Performed at [facility], [place] [address], [place], [place] [address]    Report Status 10/22/2018 FINAL  Final  Blood Culture ID Panel (Reflexed)     Status: Abnormal   Collection Time: 10/07/18 11:45 PM  Result Value Ref Range Status   Enterococcus species NOT DETECTED NOT DETECTED Final   Listeria monocytogenes NOT DETECTED NOT DETECTED Final   Staphylococcus species DETECTED (A) NOT DETECTED Final    Comment: Methicillin (oxacillin) resistant coagulase negative staphylococcus. Possible blood culture contaminant (unless isolated from more than one blood culture draw or clinical case suggests pathogenicity). No antibiotic treatment is indicated for blood  culture contaminants. CRITICAL RESULT CALLED TO, READ BACK BY AND VERIFIED WITH: PHRMD J ORIET '@0354'  10/09/18 BY S GEZAHEGN    Staphylococcus aureus (BCID) NOT DETECTED NOT DETECTED Final    Methicillin resistance DETECTED (A) NOT DETECTED Final    Comment: CRITICAL RESULT CALLED TO, READ BACK BY AND VERIFIED WITH: PHRMD J ORIET '@0354'  10/09/18 BY S GEZAHEGN    Streptococcus species NOT DETECTED NOT DETECTED Final   Streptococcus agalactiae NOT DETECTED NOT DETECTED Final   Streptococcus pneumoniae NOT DETECTED NOT DETECTED Final   Streptococcus pyogenes NOT DETECTED NOT DETECTED Final   Acinetobacter baumannii NOT DETECTED NOT DETECTED Final   Enterobacteriaceae species NOT DETECTED NOT DETECTED Final   Enterobacter cloacae complex NOT DETECTED NOT DETECTED Final   Escherichia coli NOT DETECTED NOT DETECTED Final   Klebsiella oxytoca NOT DETECTED NOT DETECTED Final   Klebsiella pneumoniae NOT DETECTED NOT DETECTED Final   Proteus species NOT DETECTED NOT DETECTED Final   Serratia marcescens NOT DETECTED NOT DETECTED Final   Haemophilus influenzae NOT DETECTED NOT DETECTED Final   Neisseria meningitidis NOT DETECTED NOT DETECTED Final   Pseudomonas aeruginosa NOT DETECTED NOT DETECTED Final   Candida albicans NOT DETECTED NOT DETECTED Final  Candida glabrata NOT DETECTED NOT DETECTED Final   Candida krusei NOT DETECTED NOT DETECTED Final   Candida parapsilosis NOT DETECTED NOT DETECTED Final   Candida tropicalis NOT DETECTED NOT DETECTED Final    Comment: Performed at [facility], [place] [address], [place], [place] [address]  Culture, blood (routine x 2) Call MD if unable to obtain prior to antibiotics being given     Status: None   Collection Time: 10/08/18 12:08 AM  Result Value Ref Range Status   Specimen Description BLOOD RIGHT HAND  Final   Special Requests   Final    BOTTLES DRAWN AEROBIC ONLY Blood Culture adequate volume   Culture   Final    NO GROWTH 5 DAYS Performed at [facility], [place] [address], [place], [place] [address]    Report Status 10/13/2018 FINAL  Final  MRSA PCR Screening     Status: None   Collection Time: 10/08/18  1:06 AM   Result Value Ref Range Status   MRSA by PCR NEGATIVE NEGATIVE Final    Comment:        The GeneXpert MRSA Assay (FDA approved for NASAL specimens only), is one component of a comprehensive MRSA colonization surveillance program. It is not intended to diagnose MRSA infection nor to guide or monitor treatment for MRSA infections. Performed at [facility], [place] [address] [place], [place] [address]   Urine culture     Status: Abnormal   Collection Time: 10/08/18  1:34 PM  Result Value Ref Range Status   Specimen Description URINE, CLEAN CATCH  Final   Special Requests   Final    Immunocompromised Performed at [facility], [place] [address] [place], [place] [address]    Culture MULTIPLE SPECIES PRESENT, SUGGEST RECOLLECTION (A)  Final   Report Status 10/09/2018 FINAL  Final  SARS Coronavirus 2 [facility] order, Performed in [place] hospital lab)     Status: Abnormal   Collection Time: 10/12/18 12:11 AM  Result Value Ref Range Status   SARS Coronavirus 2 POSITIVE (A) NEGATIVE Final    Comment: RESULT CALLED TO, READ BACK BY AND VERIFIED WITH: K MUNNET '@[id number]'  10/12/18 BY S GEZAHEGN (NOTE) If result is NEGATIVE SARS-CoV-2 target nucleic acids are NOT DETECTED. The SARS-CoV-2 RNA is generally detectable in upper and lower  respiratory specimens during the acute phase of infection. The lowest  concentration of SARS-CoV-2 viral copies this assay can detect is 250  copies / mL. A negative result does not preclude SARS-CoV-2 infection  and should not be used as the sole basis for treatment or other  patient management decisions.  A negative result may occur with  improper specimen collection / handling, submission of specimen other  than nasopharyngeal swab, presence of viral mutation(s) within the  areas targeted by this assay, and inadequate number of viral copies  (<250 copies / mL). A negative result must be combined with clinical  observations, patient history, and  epidemiological information. If result is POSITIVE SARS-CoV-2 target nucleic acids are DETECTED. T he SARS-CoV-2 RNA is generally detectable in upper and lower  respiratory specimens during the acute phase of infection.  Positive  results are indicative of active infection with SARS-CoV-2.  Clinical  correlation with patient history and other diagnostic information is  necessary to determine patient infection status.  Positive results do  not rule out bacterial infection or co-infection with other viruses. If result is PRESUMPTIVE POSTIVE SARS-CoV-2 nucleic acids MAY BE  PRESENT.   A presumptive positive result was obtained on the submitted specimen  and confirmed on repeat testing.  While 2019 novel coronavirus  (SARS-CoV-2) nucleic acids may be present in the submitted sample  additional confirmatory testing may be necessary for epidemiological  and / or clinical management purposes  to differentiate between  SARS-CoV-2 and other Sarbecovirus currently known to infect humans.  If clinically indicated additional testing with an alternate test  methodology [phone]) is  advised. The SARS-CoV-2 RNA is generally  detectable in upper and lower respiratory specimens during the acute  phase of infection. The expected result is Negative. Fact Sheet for Patients:  StrictlyIdeas.no Fact Sheet for Healthcare Providers: BankingDealers.co.za This test is not yet approved or cleared by the [place] FDA and has been authorized for detection and/or diagnosis of SARS-CoV-2 by FDA under an Emergency Use Authorization (EUA).  This EUA will remain in effect (meaning this test can be used) for the duration of the COVID-19 declaration under Section 564(b)(1) of the Act, 21 U.S.C. section 360bbb-3(b)(1), unless the authorization is terminated or revoked sooner. Performed at [facility], [place] [address], [place], [place] [address]   Blood Culture  (routine x 2)     Status: None   Collection Time: 10/12/18 12:11 AM  Result Value Ref Range Status   Specimen Description BLOOD RIGHT HAND  Final   Special Requests   Final    BOTTLES DRAWN AEROBIC AND ANAEROBIC Blood Culture results may not be optimal due to an inadequate volume of blood received in culture bottles   Culture   Final    NO GROWTH 5 DAYS Performed at [facility], [place] [address], [place], [place] [address]    Report Status 10/17/2018 FINAL  Final  Blood Culture (routine x 2)     Status: None   Collection Time: 10/12/18 12:32 AM  Result Value Ref Range Status   Specimen Description BLOOD SITE NOT SPECIFIED  Final   Special Requests   Final    BOTTLES DRAWN AEROBIC AND ANAEROBIC Blood Culture results may not be optimal due to an inadequate volume of blood received in culture bottles   Culture   Final    NO GROWTH 5 DAYS Performed at [facility], [place] [address], [place], [place] [address]    Report Status 10/17/2018 FINAL  Final  Culture, respiratory (non-expectorated)     Status: None   Collection Time: 10/12/18  8:15 AM  Result Value Ref Range Status   Specimen Description TRACHEAL ASPIRATE  Final   Special Requests NONE  Final   Gram Stain   Final    RARE WBC PRESENT, PREDOMINANTLY PMN NO ORGANISMS SEEN    Culture   Final    RARE Consistent with normal respiratory flora. Performed at [facility], [place] [address] [place], [place] [address]    Report Status 10/14/2018 FINAL  Final     Medications:   . aspirin  81 mg Per Tube Daily  . chlorhexidine gluconate (MEDLINE KIT)  15 mL Mouth Rinse BID  . Chlorhexidine Gluconate Cloth  6 each Topical Daily  . diltiazem  30 mg Per Tube Q8H  . docusate  100 mg Per Tube Daily  . furosemide  60 mg Intravenous Q12H  . insulin aspart  0-20 Units Subcutaneous Q4H  . mouth rinse  15 mL Mouth Rinse BID  . metoprolol tartrate  25 mg Per Tube BID  . pantoprazole (PROTONIX) IV  40 mg Intravenous QHS  .  polyethylene glycol  17 g Per Tube Daily  . Warfarin - Pharmacist Dosing Inpatient   Does not apply q1800   Continuous Infusions: . dextrose 5 % with kcl 50 mL/hr at 10/17/18 0600      LOS: 5 days   Charlynne Cousins  [facility]  10/17/2018, 8:09 AM

## 2018-10-17 NOTE — Progress Notes (Signed)
Physical Therapy Treatment Patient Details Name: Emily Fry MRN: [id number] DOB: 10-Dec-1955 Today's Date: 10/17/2018    History of Present Illness 62 year old female came from collapse nursing home with increasing shortness of breath and hypoxemia/COVID 19 on Oct 12, 2018.  Required intubation in the emergency room. Extubated 5/5 and reintubated. Extubated 5/7. NFA:OZHY, obstructive sleep apnea, chronic respiratory failure with hypoxemia, A. fib, allergic rhinitis, bilateral osteoarthritis of knee, diastolic heart failure, gastroesophageal reflux disease, gout, hypertension, morbid obesity.     PT Comments    Pt up in chair for almost 3 hours. Used lift for back to bed. Pt on NRB with SpO2 in the low 80's.    Follow Up Recommendations  SNF     Equipment Recommendations  None recommended by PT    Recommendations for Other Services       Precautions / Restrictions Precautions Precautions: Fall Precaution Comments: monitor vitals    Mobility  Bed Mobility Overal bed mobility: Needs Assistance Bed Mobility: Rolling Rolling: Mod assist;+2 for safety/equipment         General bed mobility comments: Assist to bring hips/legs over. Pt assisting by reaching for rail and pulling shoulders over.  Transfers Overall transfer level: Needs assistance               General transfer comment: Tolerated with VSS  Ambulation/Gait                 Stairs             Wheelchair Mobility    Modified Rankin (Stroke Patients Only)       Balance                                            Cognition Arousal/Alertness: Awake/alert Behavior During Therapy: Anxious Overall Cognitive Status: No family/caregiver present to determine baseline cognitive functioning Area of Impairment: Problem solving;Following commands                       Following Commands: Follows one step commands consistently;Follows one step commands with increased  time     Problem Solving: Slow processing        Exercises      General Comments        Pertinent Vitals/Pain Pain Assessment: No/denies pain    Home Living                      Prior Function            PT Goals (current goals can now be found in the care plan section) Progress towards PT goals: Progressing toward goals    Frequency    Min 3X/week      PT Plan Current plan remains appropriate    Co-evaluation PT/OT/SLP Co-Evaluation/Treatment: Yes            AM-PAC PT "6 Clicks" Mobility   Outcome Measure  Help needed turning from your back to your side while in a flat bed without using bedrails?: A Lot Help needed moving from lying on your back to sitting on the side of a flat bed without using bedrails?: Total Help needed moving to and from a bed to a chair (including a wheelchair)?: Total Help needed standing up from a chair using your arms (e.g., wheelchair or bedside chair)?: Total Help needed to walk in hospital  room?: Total Help needed climbing 3-5 steps with a railing? : Total 6 Click Score: 7    End of Session Equipment Utilized During Treatment: Oxygen(NRB) Activity Tolerance: Patient tolerated treatment well Patient left: with nursing/sitter in room;in bed Nurse Communication: Mobility status;Need for lift equipment PT Visit Diagnosis: Muscle weakness (generalized) (M62.81);Difficulty in walking, not elsewhere classified (R26.2)     Time: 2330-0762 PT Time Calculation (min) (ACUTE ONLY): 21 min  Charges:  $Therapeutic Activity: 8-22 mins                     [facility] PT Acute Rehabilitation Services Pager [phone] Office [phone]    Angelina Ok [facility] 10/17/2018, 4:03 PM

## 2018-10-17 NOTE — Plan of Care (Signed)

## 2018-10-17 NOTE — Progress Notes (Signed)
Patient spoke with sister Annice Pih) via elink video.  Sister updated and all questions answered. Patient put on BIPAP afterwards by RT.

## 2018-10-18 DIAGNOSIS — J411 Mucopurulent chronic bronchitis: Secondary | ICD-10-CM

## 2018-10-18 LAB — BASIC METABOLIC PANEL
Anion gap: 10 (ref 5–15)
BUN: 66 mg/dL — ABNORMAL HIGH (ref 6–20)
CO2: 36 mmol/L — ABNORMAL HIGH (ref 22–32)
Calcium: 8.7 mg/dL — ABNORMAL LOW (ref 8.9–10.3)
Chloride: 99 mmol/L (ref 98–111)
Creatinine, Ser: 1.15 mg/dL — ABNORMAL HIGH (ref 0.44–1.00)
GFR calc Af Amer: 60 mL/min — ABNORMAL LOW (ref >60)
GFR calc non Af Amer: 52 mL/min — ABNORMAL LOW (ref >60)
Glucose, Bld: 132 mg/dL — ABNORMAL HIGH (ref 70–99)
Potassium: 4 mmol/L (ref 3.5–5.1)
Sodium: 145 mmol/L (ref 135–145)

## 2018-10-18 LAB — PROTIME-INR
INR: 2.1 — ABNORMAL HIGH (ref 0.8–1.2)
Prothrombin Time: 23 seconds — ABNORMAL HIGH (ref 11.4–15.2)

## 2018-10-18 LAB — GLUCOSE, CAPILLARY
Glucose-Capillary: 124 mg/dL — ABNORMAL HIGH (ref 70–99)
Glucose-Capillary: 116 mg/dL — ABNORMAL HIGH (ref 70–99)
Glucose-Capillary: 119 mg/dL — ABNORMAL HIGH (ref 70–99)
Glucose-Capillary: 108 mg/dL — ABNORMAL HIGH (ref 70–99)

## 2018-10-18 LAB — MAGNESIUM: Magnesium: 2.2 mg/dL (ref 1.7–2.4)

## 2018-10-18 LAB — D-DIMER, QUANTITATIVE (NOT AT ARMC): D-Dimer, Quant: 3.52 ug/mL-FEU — ABNORMAL HIGH (ref 0.00–0.50)

## 2018-10-18 LAB — C-REACTIVE PROTEIN: CRP: 2 mg/dL — ABNORMAL HIGH (ref <1.0)

## 2018-10-18 LAB — FERRITIN: Ferritin: 781 ng/mL — ABNORMAL HIGH (ref 11–307)

## 2018-10-18 MED ORDER — MORPHINE 100MG IN NS 100ML (1MG/ML) PREMIX INFUSION: 1.0000 mg/h | INTRAVENOUS | Status: DC

## 2018-10-18 MED ORDER — MORPHINE 100MG IN NS 100ML (1MG/ML) PREMIX INFUSION
0.0000 mg/h | INTRAVENOUS | Status: DC
  Administered 2018-10-19: 1 mg/h via INTRAVENOUS
  Filled 2018-10-18 (×2): qty 100

## 2018-10-18 MED ORDER — METOPROLOL TARTRATE 5 MG/5ML IV SOLN
2.5000 mg | Freq: Four times a day (QID) | INTRAVENOUS | Status: DC
  Administered 2018-10-18: 22:00:00 2.5 mg via INTRAVENOUS
  Filled 2018-10-18: qty 5

## 2018-10-18 MED ORDER — WARFARIN SODIUM 7.5 MG PO TABS
7.5000 mg | ORAL_TABLET | Freq: Once | ORAL | Status: DC
  Filled 2018-10-18: qty 1

## 2018-10-18 MED ORDER — MORPHINE 100MG IN NS 100ML (1MG/ML) PREMIX INFUSION
1.0000 mg/h | INTRAVENOUS | Status: DC
  Filled 2018-10-18: qty 100

## 2018-10-18 MED ORDER — DILTIAZEM HCL-DEXTROSE 100-5 MG/100ML-% IV SOLN (PREMIX)
5.0000 mg/h | INTRAVENOUS | Status: DC
  Filled 2018-10-18: qty 100

## 2018-10-18 MED ORDER — MORPHINE SULFATE (PF) 2 MG/ML IV SOLN: 2.0000 mg | INTRAVENOUS | Status: DC | PRN

## 2018-10-18 MED ORDER — SODIUM CHLORIDE 0.9 % IV SOLN
0.0000 mg/h | INTRAVENOUS | Status: DC
  Filled 2018-10-18: qty 10

## 2018-10-18 MED ORDER — MORPHINE SULFATE (PF) 2 MG/ML IV SOLN
2.0000 mg | INTRAVENOUS | Status: DC | PRN
  Administered 2018-10-18 – 2018-10-19 (×4): 2 mg via INTRAVENOUS
  Filled 2018-10-18 (×4): qty 1

## 2018-10-18 NOTE — Progress Notes (Signed)
[facility]  Full note to follow.  Oxygenation worsening overnight.  Now on BIPAP.   She tells me that she does not want to go on mechanical ventilation under any circumstances.  DNR order written. Continue BIPAP, morphine prn  Heber [place], MD [place] [facility] Pager: [phone] Cell: [phone] If no response, call [phone]

## 2018-10-18 NOTE — Progress Notes (Signed)
NAME:  Emily GunnerJanet Fry, MRN:  [id number], DOB:  March 23, 1956, LOS: 6 ADMISSION DATE:  10/31/2018, CONSULTATION DATE:  5/6 REFERRING MD:  Dr. Preston FleetingGlick, CHIEF COMPLAINT:  Dyspnea   Brief History   63 year old female came from collapse nursing home with increasing shortness of breath and hypoxemia/COVID 19 on Oct 12, 2018.  Required intubation in the emergency room.   Past Medical History  COPD, obstructive sleep apnea, chronic respiratory failure with hypoxemia, A. fib, allergic rhinitis, bilateral osteoarthritis of knee, diastolic heart failure, gastroesophageal reflux disease, gout, hypertension, morbid obesity  Significant Hospital Events   May 2 admitted May 5 extubated, required reintubation for hypoxemia, hypercarbia 5/8 placed on BIPAP, worsening hypoxemia, turned down intubation, made DNR  Consults:    Procedures:  May 3 ETT > 5/5, 5/5 > 5/7  Significant Diagnostic Tests:    Micro Data:  5/3 BCx 2 >> 5/3 COVID 19 >> POSITIVE 5/3 trach asp >>  Antimicrobials/COVID  May 7 Actemra May 8 Actemra again, convalescent plasma  Interim history/subjective:   Worsening hypoxemia Placed on BIPAP Refuses intubation, made DNR   Objective   Blood pressure 125/70, pulse 96, temperature 98.8 F (37.1 C), resp. rate (!) 26, height 5\' 4"  (1.626 m), weight (!) 151.7 kg, SpO2 98 %.    Vent Mode: BIPAP FiO2 (%):  [100 %] 100 % Set Rate:  [10 bmp-16 bmp] 16 bmp   Intake/Output Summary (Last 24 hours) at 10/18/2018 0751 Last data filed at 10/18/2018 0700 Gross per 24 hour  Intake 480 ml  Output 3030 ml  Net -2550 ml   Filed Weights   10/16/18 0500 10/17/18 0404 10/18/18 0500  Weight: (!) 155.8 kg (!) 150.6 kg (!) 151.7 kg    Examination:  General:  Morbidly obese, in chair on BIPAP HENT: NCAT BIPAP in place PULM: Diminished breath sounds, on BIPAP CV: RRR, no mgr GI: BS+, soft, nontender MSK: normal bulk and tone Neuro: awake, alert, conversant      Resolved Hospital  Problem list     Assessment & Plan:  Acute respiratory failure with hypoxemia initially due to acute pulmonary edema from diastolic heart failure exacerbation: severely worsening due to COVID 19ARDS Continue diuresis with lasix Aspiration precautions Diet as tolerated Continue BIPAP DNR Refuses morphine for now, but keep available as needed for respiratory distress Suspect she will eventually drift off to sleep and die today based on progression of O2 saturation  Chronic hypercarbic respiratory failure due to obesity hypoventilation syndrome Continue BIPAP  COVID-19: With worsening oxygenation I am concerned that she has COVID pneumonia Maintain in ICU  Best practice:  Diet: regular diet Pain/Anxiety/Delirium protocol (if indicated): d/c VAP protocol (if indicated): n/a DVT prophylaxis: warfarin GI prophylaxis: PPI Glucose control: SSI Mobility: PT consult, out of bed Code Status: DNR Family Communication: Updated sister Emily Fry at length today, she understands the patient's decision to be DNR Disposition: remain in ICU  Labs   CBC: Recent Labs  Lab 10/12/18 0011  10/13/18 0545 10/14/18 0515 10/14/18 1423 10/15/18 0323 10/16/18 0225 10/17/18 0224  WBC 7.1   < > 5.6 6.4  --  4.0 4.2 5.2  NEUTROABS 5.7  --   --   --   --   --   --   --   HGB 11.5*   < > 10.7* 11.1* 11.6* 10.0* 10.1* 10.4*  HCT 41.7   < > 36.4 41.5 34.0* 35.0* 36.5 37.1  MCV 102.5*   < > 95.5 103.0*  --  100.3* 101.1* 101.1*  PLT 171   < > 166 148*  --  120* 111* 128*   < > = values in this interval not displayed.    Basic Metabolic Panel: Recent Labs  Lab 10/12/18 1142 10/12/18 1700  10/13/18 0545 10/13/18 1530 10/14/18 0515 10/14/18 1423 10/15/18 0323 10/16/18 0225 10/17/18 0224 10/18/18 0158  NA  --   --    < > 142 144 146* 146* 149* 150* 148*  --   K  --   --    < > 2.7* 3.9 3.9 3.6 2.9* 4.3 4.4  --   CL  --   --   --  100 100 101  --  105 107 104  --   CO2  --   --   --  34* 34* 35*   --  36* 37* 36*  --   GLUCOSE  --   --   --  112* 135* 117*  --  137* 200* 165*  --   BUN  --   --   --  44* 48* 64*  --  70* 75* 70*  --   CREATININE  --   --   --  1.12* 1.10* 1.35*  --  1.25* 1.25* 1.22*  --   CALCIUM  --   --   --  8.8* 8.8* 8.3*  --  8.0* 8.3* 8.8*  --   MG 2.7* 2.1  --  2.0 2.1  --   --  1.9 2.5* 2.5* 2.2  PHOS 1.8* 1.2*  --  2.1*  --   --   --  3.1  --   --   --    < > = values in this interval not displayed.   GFR: Estimated Creatinine Clearance: 72.4 mL/min (A) (by C-G formula based on SCr of 1.22 mg/dL (H)). Recent Labs  Lab 10/12/18 0011 10/12/18 0433  10/14/18 0515 10/15/18 0323 10/16/18 0225 10/17/18 0224  PROCALCITON 0.16  --   --   --   --   --   --   WBC 7.1 5.5   < > 6.4 4.0 4.2 5.2  LATICACIDVEN 0.5 0.9  --   --   --   --   --    < > = values in this interval not displayed.    Liver Function Tests: Recent Labs  Lab 10/12/18 0011 10/17/18 0224  AST 39 62*  ALT 33 35  ALKPHOS 117 75  BILITOT 0.6 0.3  PROT 6.7 6.0*  ALBUMIN 3.1* 2.6*   No results for input(s): LIPASE, AMYLASE in the last 168 hours. No results for input(s): AMMONIA in the last 168 hours.  ABG    Component Value Date/Time   PHART 7.365 10/14/2018 1423   PCO2ART 64.1 (H) 10/14/2018 1423   PO2ART 62.0 (L) 10/14/2018 1423   HCO3 36.6 (H) 10/14/2018 1423   TCO2 39 (H) 10/14/2018 1423   O2SAT 90.0 10/14/2018 1423     Coagulation Profile: Recent Labs  Lab 10/14/18 0515 10/15/18 0323 10/16/18 0225 10/17/18 0224 10/18/18 0158  INR 1.7* 2.1* 1.9* 2.0* 2.1*    Cardiac Enzymes: Recent Labs  Lab 10/12/18 0011  TROPONINI <0.03    HbA1C: Hgb A1c MFr Bld  Date/Time Value Ref Range Status  08/25/2015 02:14 AM 11.9 (H) 4.0 - 6.0 % Final    CBG: Recent Labs  Lab 10/17/18 0832 10/17/18 1208 10/17/18 1711 10/17/18 1950 10/18/18 0413  GLUCAP 158* 230* 127* 163* 124*  Critical care time: 50 minutes     Heber Bliss, MD [place] [facility] Pager:  [phone] Cell: [phone] If no response, call [phone]

## 2018-10-18 NOTE — Progress Notes (Signed)
Morphine 100mg /120ml cleaned and hand delivered to [place], Arlester Marker from pharmacy.

## 2018-10-18 NOTE — Progress Notes (Signed)
   10/18/18 1500  Vitals  BP (!) 85/70  MAP (mmHg) 76  Pulse Rate 89  ECG Heart Rate 90  Resp (!) 30  Oxygen Therapy  SpO2 (!) 63 %  O2 Device Bi-PAP  MEWS Score  MEWS RR 2  MEWS Pulse 0  MEWS Systolic 1  MEWS LOC 0  MEWS Temp 0  MEWS Score 3  MEWS Score Color Yellow  Phone update given to Anabel Halon patients sister and next of kin.

## 2018-10-18 NOTE — Progress Notes (Signed)
ANTICOAGULATION CONSULT NOTE - Follow Up Consult  Pharmacy Consult for Warfarin Indication: atrial fibrillation  Allergies  Allergen Reactions  . Contrast Media [Iodinated Diagnostic Agents] Swelling  . Tomato Other (See Comments)    unknow per MAR  . Amoxicillin Diarrhea, Itching and Other (See Comments)    Has patient had a PCN reaction causing immediate rash, facial/tongue/throat swelling, SOB or lightheadedness with hypotension: No Has patient had a PCN reaction causing severe rash involving mucus membranes or skin necrosis: No Has patient had a PCN reaction that required hospitalization: No Has patient had a PCN reaction occurring within the last 10 years: Yes If all of the above answers are "NO", then may proceed with Cephalosporin use.     Patient Measurements: Height: _0  (162.6 cm) Weight: (!) 334 lb 7 oz (151.7 kg) IBW/kg (Calculated) : 54.7  Vital Signs: Temp: 99 F (37.2 C) (05/09 0800) Temp Source: Axillary (05/09 0800) BP: 123/70 (05/09 0800) Pulse Rate: 98 (05/09 0800)  Labs: Recent Labs    10/16/18 0225 10/17/18 0224 10/18/18 0158  HGB 10.1* 10.4*  --   HCT 36.5 37.1  --   PLT 111* 128*  --   LABPROT 21.1* 22.5* 23.0*  INR 1.9* 2.0* 2.1*  CREATININE 1.25* 1.22*  --     Estimated Creatinine Clearance: 72.4 mL/min (A) (by C-G formula based on SCr of 1.22 mg/dL (H)).   Medications:  Scheduled:  . sodium chloride   Intravenous Once  . aspirin  81 mg Oral Daily  . chlorhexidine gluconate (MEDLINE KIT)  15 mL Mouth Rinse BID  . Chlorhexidine Gluconate Cloth  6 each Topical Daily  . diltiazem  30 mg Oral Q8H  . docusate sodium  100 mg Oral Daily  . feeding supplement (ENSURE ENLIVE)  237 mL Oral BID BM  . furosemide  60 mg Intravenous Q12H  . insulin aspart  0-20 Units Subcutaneous Q4H  . mouth rinse  15 mL Mouth Rinse BID  . metoprolol tartrate  25 mg Oral BID  . pantoprazole  40 mg Oral QHS  . polyethylene glycol  17 g Oral Daily  . warfarin   7.5 mg Oral ONCE-1800  . Warfarin - Pharmacist Dosing Inpatient   Does not apply q1800    Assessment: 32 YOF with h/o Afib on warfarin 472m daily exc for 636mon MWF PTA. INR therapeutic at 2.1. Have been giving higher doses over the last few days. Hgb 10.4, plts 128  Goal of Therapy:  INR 2-3 Monitor platelets by anticoagulation protocol: Yes   Plan:  Give Coumadin 7.72m28mO x 1 Monitor daily INR, CBC, s/s of bleed  NatElenor QuinonesharmD, BCPS, BCIDP Clinical Pharmacist 10/18/2018 8:20 AM

## 2018-10-18 NOTE — Progress Notes (Addendum)
[facility] PROGRESS NOTE    Progress Note  Emily Fry  FXJ:[id number] DOB: 1956-02-18 DOA: 11/04/2018 PCP: Kirk Ruths, MD     Brief Narrative:   Emily Fry is an 63 y.o. female past medical history of chronic respiratory failure, COPD on 2 L nasal cannula, obstructive sleep apnea with morbid obesity, atrial fibrillation on Coumadin, chronic diastolic heart failure who was transferred to the ED from collapse nursing home facility with increased shortness of breath and hypoxia recently discharged from the hospital on 10/09/2018 for acute on chronic hypercarbic and hypoxic respiratory failure with 2- COVID test. Presents to the ED with dyspnea that required BiPAP initially and intubated in the ED, her SARS-CoV-2 PCR was positive on this admission.  Status post extubation on 10/14/2018  Events: 10/12/2018 intubated 5/5/ 2020 extubated 10/23/2018: Chest x-ray showed diffuse bilateral airspace disease  Micro: 10/12/2018: COVID-19 positive 10/12/2018: Blood cultures negative 10/12/2018: Tracheal aspirate has remained negative  Medications: 10/16/2018 [place] Assessment/Plan:   Acute on chronic respiratory failure with hypoxia setting of COVID-19 viral infection: At baseline she is on 2 to 3 L nasal cannula. Intubated in the ED status post extubation on 10/14/2018 Initially her respiratory distress is felt to be more likely due to volume overload than from acute lung injury COVID-19 Labs  Recent Labs    10/16/18 0225 10/17/18 0224 10/18/18 0158  FERRITIN 398* 451* 781*  CRP 14.6* 6.5*  --   We will continue to diurese aggressively and try to keep on the dry side monitor and replete electrolytes as needed. She is negative about 8.3 L today. The patient critical care and I had a long discussion about her respiratory status and her significant decline, the patient relates she does not want to be intubated. Her oxygen requirement has been increasing compared to yesterday, right  now she is on 15 L nasal cannula and a nonrebreather. Pulmonary and critical care spoke with the daughter and explained the patient's wishes that she did not want to be intubated again, the patient also spoke with the daughter and explained her wishes. For now we will continue conservative managements with changing position continue high flow oxygen.  Appreciate pulmonary and critical care assistance. We will start her on IV morphine as needed for respiratory distress.  Acute metabolic encephalopathy: Likely due to hypercarbia now resolved.  Chronic atrial fibrillation on Coumadin: It was resume her metoprolol and Cardizem currently rate controlled.  Continue Coumadin per pharmacy.  Acute on chronic diastolic heart failure: Going back through her weight estimate to be < of 150 kg. She continues to diurese nicely overnight at over 3 L of urine output. Continue strict I's and O's Daily weight restrict her oral intake.  CAD: Continue aspirin and metoprolol.  Type 2 diabetes mellitus with hyperosmolar nonketotic hyperglycemia (HCC) Glucose fairly controlled, she is just on sliding scale continue current regimen.  Hypokalemia: Potassium has been repleted and has remained above 4.  Hypophosphatemia: Resolved after repletion.  Hypernatremia: Sodium is improving, on IV Lasix continue to trend.   DVT prophylaxis: lovenxo Family Communication:none Disposition Plan/Barrier to D/C: unable to determine Code Status:     Code Status Orders  (From admission, onward)         Start     Ordered   10/12/18 0353  Full code  Continuous     10/12/18 0357        Code Status History    Date Active Date Inactive Code Status Order ID Comments User Context  10/07/2018 2307 10/09/2018 1819 Full Code [id number]  Wilber Oliphant, MD ED   04/14/2018 2246 04/16/2018 1832 Full Code [id number]  Gladstone Lighter, MD Inpatient   08/25/2015 0914 08/28/2015 2013 Full Code [id number]  Saundra Shelling, MD  Inpatient        IV Access:    Peripheral IV   Procedures and diagnostic studies:   No results found.   Medical Consultants:    None.  Anti-Infectives:   None  Subjective:    Emily Fry she relates her breathing is unchanged, she relates she does not want to be intubated.  Objective:    Vitals:   10/18/18 0418 10/18/18 0500 10/18/18 0600 10/18/18 0700  BP:  114/69 119/69 125/70  Pulse:  90 89 96  Resp:  (!) 30 (!) 21 (!) 26  Temp:      TempSrc:      SpO2: (!) 82% (!) 82% (!) 82% 98%  Weight:  (!) 151.7 kg    Height:        Intake/Output Summary (Last 24 hours) at 10/18/2018 0743 Last data filed at 10/18/2018 0700 Gross per 24 hour  Intake 480 ml  Output 3030 ml  Net -2550 ml   Filed Weights   10/16/18 0500 10/17/18 0404 10/18/18 0500  Weight: (!) 155.8 kg (!) 150.6 kg (!) 151.7 kg    Exam: General exam: In no acute distress, in the chair in no acute distress.  Sitting up in the chair. Respiratory system: Good air movement with crackles at bases bilaterally. Cardiovascular system: Normal rate and rhythm positive S1-S2 Gastrointestinal system: Positive bowel sounds soft nontender nondistended Central nervous system: She is awake alert and oriented x3 nonfocal. Extremities: Trace edema Skin: No rashes Psychiatry: Judgment and insight appear normal mood and affect are appropriate.  Data Reviewed:    Labs: Basic Metabolic Panel: Recent Labs  Lab 10/12/18 1142 10/12/18 1700  10/13/18 0545 10/13/18 1530 10/14/18 0515 10/14/18 1423 10/15/18 0323 10/16/18 0225 10/17/18 0224 10/18/18 0158  NA  --   --    < > 142 144 146* 146* 149* 150* 148*  --   K  --   --    < > 2.7* 3.9 3.9 3.6 2.9* 4.3 4.4  --   CL  --   --   --  100 100 101  --  105 107 104  --   CO2  --   --   --  34* 34* 35*  --  36* 37* 36*  --   GLUCOSE  --   --   --  112* 135* 117*  --  137* 200* 165*  --   BUN  --   --   --  44* 48* 64*  --  70* 75* 70*  --   CREATININE  --    --   --  1.12* 1.10* 1.35*  --  1.25* 1.25* 1.22*  --   CALCIUM  --   --   --  8.8* 8.8* 8.3*  --  8.0* 8.3* 8.8*  --   MG 2.7* 2.1  --  2.0 2.1  --   --  1.9 2.5* 2.5* 2.2  PHOS 1.8* 1.2*  --  2.1*  --   --   --  3.1  --   --   --    < > = values in this interval not displayed.   GFR Estimated Creatinine Clearance: 72.4 mL/min (A) (by C-G formula based on SCr of 1.22 mg/dL (  H)). Liver Function Tests: Recent Labs  Lab 10/12/18 0011 10/17/18 0224  AST 39 62*  ALT 33 35  ALKPHOS 117 75  BILITOT 0.6 0.3  PROT 6.7 6.0*  ALBUMIN 3.1* 2.6*   No results for input(s): LIPASE, AMYLASE in the last 168 hours. No results for input(s): AMMONIA in the last 168 hours. Coagulation profile Recent Labs  Lab 10/14/18 0515 10/15/18 0323 10/16/18 0225 10/17/18 0224 10/18/18 0158  INR 1.7* 2.1* 1.9* 2.0* 2.1*   COVID-19 Labs  Recent Labs    10/16/18 0225 10/17/18 0224 10/18/18 0158  FERRITIN 398* 451* 781*  CRP 14.6* 6.5*  --     Lab Results  Component Value Date   SARSCOV2NAA POSITIVE (A) 10/12/2018   [place] NEGATIVE 10/07/2018   [place] NOT DETECTED 10/07/2018    CBC: Recent Labs  Lab 10/12/18 0011  10/13/18 0545 10/14/18 0515 10/14/18 1423 10/15/18 0323 10/16/18 0225 10/17/18 0224  WBC 7.1   < > 5.6 6.4  --  4.0 4.2 5.2  NEUTROABS 5.7  --   --   --   --   --   --   --   HGB 11.5*   < > 10.7* 11.1* 11.6* 10.0* 10.1* 10.4*  HCT 41.7   < > 36.4 41.5 34.0* 35.0* 36.5 37.1  MCV 102.5*   < > 95.5 103.0*  --  100.3* 101.1* 101.1*  PLT 171   < > 166 148*  --  120* 111* 128*   < > = values in this interval not displayed.   Cardiac Enzymes: Recent Labs  Lab 10/12/18 0011  TROPONINI <0.03   BNP (last 3 results) No results for input(s): PROBNP in the last 8760 hours. CBG: Recent Labs  Lab 10/17/18 0832 10/17/18 1208 10/17/18 1711 10/17/18 1950 10/18/18 0413  GLUCAP 158* 230* 127* 163* 124*   D-Dimer: No results for input(s): DDIMER in the last 72  hours. Hgb A1c: No results for input(s): HGBA1C in the last 72 hours. Lipid Profile: No results for input(s): CHOL, HDL, LDLCALC, TRIG, CHOLHDL, LDLDIRECT in the last 72 hours. Thyroid function studies: No results for input(s): TSH, T4TOTAL, T3FREE, THYROIDAB in the last 72 hours.  Invalid input(s): FREET3 Anemia work up: Recent Labs    10/17/18 0224 10/18/18 0158  FERRITIN 451* 781*   Sepsis Labs: Recent Labs  Lab 10/12/18 0011 10/12/18 0433  10/14/18 0515 10/15/18 0323 10/16/18 0225 10/17/18 0224  PROCALCITON 0.16  --   --   --   --   --   --   WBC 7.1 5.5   < > 6.4 4.0 4.2 5.2  LATICACIDVEN 0.5 0.9  --   --   --   --   --    < > = values in this interval not displayed.   Microbiology Recent Results (from the past 240 hour(s))  Urine culture     Status: Abnormal   Collection Time: 10/08/18  1:34 PM  Result Value Ref Range Status   Specimen Description URINE, CLEAN CATCH  Final   Special Requests   Final    Immunocompromised Performed at [facility], [address] [place], [place] [address]    Culture MULTIPLE SPECIES PRESENT, SUGGEST RECOLLECTION (A)  Final   Report Status 10/09/2018 FINAL  Final  SARS Coronavirus 2 [facility] order, Performed in [place] hospital lab)     Status: Abnormal   Collection Time: 10/12/18 12:11 AM  Result Value Ref Range Status   SARS Coronavirus  2 POSITIVE (A) NEGATIVE Final    Comment: RESULT CALLED TO, READ BACK BY AND VERIFIED WITH: K MUNNET _0  10/12/18 BY S GEZAHEGN (NOTE) If result is NEGATIVE SARS-CoV-2 target nucleic acids are NOT DETECTED. The SARS-CoV-2 RNA is generally detectable in upper and lower  respiratory specimens during the acute phase of infection. The lowest  concentration of SARS-CoV-2 viral copies this assay can detect is 250  copies / mL. A negative result does not preclude SARS-CoV-2 infection  and should not be used as the sole basis for treatment or other  patient management decisions.  A  negative result may occur with  improper specimen collection / handling, submission of specimen other  than nasopharyngeal swab, presence of viral mutation(s) within the  areas targeted by this assay, and inadequate number of viral copies  (<250 copies / mL). A negative result must be combined with clinical  observations, patient history, and epidemiological information. If result is POSITIVE SARS-CoV-2 target nucleic acids are DETECTED. T he SARS-CoV-2 RNA is generally detectable in upper and lower  respiratory specimens during the acute phase of infection.  Positive  results are indicative of active infection with SARS-CoV-2.  Clinical  correlation with patient history and other diagnostic information is  necessary to determine patient infection status.  Positive results do  not rule out bacterial infection or co-infection with other viruses. If result is PRESUMPTIVE POSTIVE SARS-CoV-2 nucleic acids MAY BE PRESENT.   A presumptive positive result was obtained on the submitted specimen  and confirmed on repeat testing.  While 2019 novel coronavirus  (SARS-CoV-2) nucleic acids may be present in the submitted sample  additional confirmatory testing may be necessary for epidemiological  and / or clinical management purposes  to differentiate between  SARS-CoV-2 and other Sarbecovirus currently known to infect humans.  If clinically indicated additional testing with an alternate test  methodology [phone]) is  advised. The SARS-CoV-2 RNA is generally  detectable in upper and lower respiratory specimens during the acute  phase of infection. The expected result is Negative. Fact Sheet for Patients:  StrictlyIdeas.no Fact Sheet for Healthcare Providers: BankingDealers.co.za This test is not yet approved or cleared by the [place] FDA and has been authorized for detection and/or diagnosis of SARS-CoV-2 by FDA under an Emergency Use  Authorization (EUA).  This EUA will remain in effect (meaning this test can be used) for the duration of the COVID-19 declaration under Section 564(b)(1) of the Act, 21 U.S.C. section 360bbb-3(b)(1), unless the authorization is terminated or revoked sooner. Performed at [facility], [place] [address], [place], [place] [address]   Blood Culture (routine x 2)     Status: None   Collection Time: 10/12/18 12:11 AM  Result Value Ref Range Status   Specimen Description BLOOD RIGHT HAND  Final   Special Requests   Final    BOTTLES DRAWN AEROBIC AND ANAEROBIC Blood Culture results may not be optimal due to an inadequate volume of blood received in culture bottles   Culture   Final    NO GROWTH 5 DAYS Performed at [facility], [place] [address] [place], [place] [address]    Report Status 10/17/2018 FINAL  Final  Blood Culture (routine x 2)     Status: None   Collection Time: 10/12/18 12:32 AM  Result Value Ref Range Status   Specimen Description BLOOD SITE NOT SPECIFIED  Final   Special Requests   Final    BOTTLES DRAWN AEROBIC AND ANAEROBIC Blood  Culture results may not be optimal due to an inadequate volume of blood received in culture bottles   Culture   Final    NO GROWTH 5 DAYS Performed at [facility], [place] [address], [place], [place] [address]    Report Status 10/17/2018 FINAL  Final  Culture, respiratory (non-expectorated)     Status: None   Collection Time: 10/12/18  8:15 AM  Result Value Ref Range Status   Specimen Description TRACHEAL ASPIRATE  Final   Special Requests NONE  Final   Gram Stain   Final    RARE WBC PRESENT, PREDOMINANTLY PMN NO ORGANISMS SEEN    Culture   Final    RARE Consistent with normal respiratory flora. Performed at [facility], [place] [address] [place], [place] [address]    Report Status 10/14/2018 FINAL  Final     Medications:   . sodium chloride   Intravenous Once  . aspirin  81 mg Oral Daily  . chlorhexidine  gluconate (MEDLINE KIT)  15 mL Mouth Rinse BID  . Chlorhexidine Gluconate Cloth  6 each Topical Daily  . diltiazem  30 mg Oral Q8H  . docusate sodium  100 mg Oral Daily  . feeding supplement (ENSURE ENLIVE)  237 mL Oral BID BM  . furosemide  60 mg Intravenous Q12H  . insulin aspart  0-20 Units Subcutaneous Q4H  . mouth rinse  15 mL Mouth Rinse BID  . metoprolol tartrate  25 mg Oral BID  . pantoprazole  40 mg Oral QHS  . polyethylene glycol  17 g Oral Daily  . Warfarin - Pharmacist Dosing Inpatient   Does not apply q1800   Continuous Infusions:     LOS: 6 days   [facility]  10/18/2018, 7:43 AM

## 2018-10-18 NOTE — Progress Notes (Signed)
[facility] P[facility]  Worsening oxygenation on BIPAP Wants to eat Refusing morphine  We will let her eat, will be ready to use morphine however if her condition worsens  Updated Annice Pih, offered opportunity to visit, she refused stating that Doneshia had given her strict instructions to stay home.  Heber Grandview, MD [facility] Pager: [phone] Cell: [phone] If no response, call [phone]

## 2018-10-18 NOTE — Progress Notes (Addendum)
Pt refused the morphine drip. Rn got the drip in the room so we wasted down the sink with Christoper Fabian as a witness.  Ulyses Southward, PharmD, BCIDP, AAHIVP, CPP Infectious Disease Pharmacist 10/18/2018 10:33 PM   I witnessed the morphine gtt being wasted.  Christoper Fabian, PharmD, BCPS Clinical pharmacist 10/18/2018 10:34 PM

## 2018-10-18 NOTE — Progress Notes (Signed)
After having conversation with the patient she want to have a little bit of ice chips, change patient from BiPAP to nasal cannula with a nonrebreather and she desaturated into the 40s and 50s. He is back on BiPAP she came right up. She agreed that she did not want anything to eat. We will continue IV morphine PRN.  For shortness of breath discomfort or pain.

## 2018-10-18 NOTE — Progress Notes (Signed)
After eating breakfast, pt's O2 sats remained in the 70's on NRB and HFNC. RT came to bedside and applied BiPAP. Dr. Kendrick Fries came to bedside and informed patient about needs for intubation, and she refused. Annice Pih was updated regarding pt's treatment decisions with pt present on FaceTime and stated she was positive about her decision. Pt remains on BiPAP.

## 2018-10-19 DIAGNOSIS — J449 Chronic obstructive pulmonary disease, unspecified: Secondary | ICD-10-CM

## 2018-10-19 LAB — POCT I-STAT 7, (LYTES, BLD GAS, ICA,H+H)
Acid-Base Excess: 6 mmol/L — ABNORMAL HIGH (ref 0.0–2.0)
Bicarbonate: 37.1 mmol/L — ABNORMAL HIGH (ref 20.0–28.0)
Calcium, Ion: 1.25 mmol/L (ref 1.15–1.40)
HCT: 38 % (ref 36.0–46.0)
Hemoglobin: 12.9 g/dL (ref 12.0–15.0)
O2 Saturation: 54 %
Potassium: 4.5 mmol/L (ref 3.5–5.1)
Sodium: 143 mmol/L (ref 135–145)
TCO2: 40 mmol/L — ABNORMAL HIGH (ref 22–32)
pCO2 arterial: 91.1 mmHg (ref 32.0–48.0)
pH, Arterial: 7.217 — ABNORMAL LOW (ref 7.350–7.450)
pO2, Arterial: 36 mmHg — CL (ref 83.0–108.0)

## 2018-10-19 LAB — BASIC METABOLIC PANEL
Anion gap: 10 (ref 5–15)
BUN: 68 mg/dL — ABNORMAL HIGH (ref 6–20)
CO2: 35 mmol/L — ABNORMAL HIGH (ref 22–32)
Calcium: 8.8 mg/dL — ABNORMAL LOW (ref 8.9–10.3)
Chloride: 100 mmol/L (ref 98–111)
Creatinine, Ser: 1.5 mg/dL — ABNORMAL HIGH (ref 0.44–1.00)
GFR calc Af Amer: 43 mL/min — ABNORMAL LOW (ref >60)
GFR calc non Af Amer: 37 mL/min — ABNORMAL LOW (ref >60)
Glucose, Bld: 159 mg/dL — ABNORMAL HIGH (ref 70–99)
Potassium: 4.2 mmol/L (ref 3.5–5.1)
Sodium: 145 mmol/L (ref 135–145)

## 2018-10-19 LAB — PROTIME-INR
INR: 2.5 — ABNORMAL HIGH (ref 0.8–1.2)
Prothrombin Time: 26.3 seconds — ABNORMAL HIGH (ref 11.4–15.2)

## 2018-10-19 LAB — C-REACTIVE PROTEIN: CRP: 0.9 mg/dL (ref <1.0)

## 2018-10-19 LAB — MAGNESIUM: Magnesium: 2.3 mg/dL (ref 1.7–2.4)

## 2018-10-19 LAB — GLUCOSE, CAPILLARY
Glucose-Capillary: 125 mg/dL — ABNORMAL HIGH (ref 70–99)
Glucose-Capillary: 126 mg/dL — ABNORMAL HIGH (ref 70–99)
Glucose-Capillary: 148 mg/dL — ABNORMAL HIGH (ref 70–99)

## 2018-10-19 LAB — FERRITIN: Ferritin: 862 ng/mL — ABNORMAL HIGH (ref 11–307)

## 2018-10-19 LAB — D-DIMER, QUANTITATIVE: D-Dimer, Quant: 6.47 ug/mL-FEU — ABNORMAL HIGH (ref 0.00–0.50)

## 2018-10-19 MED ORDER — MORPHINE 100MG IN NS 100ML (1MG/ML) PREMIX INFUSION: 0.0000 mg/h | INTRAVENOUS | Status: DC

## 2018-10-20 LAB — BPAM FFP
Blood Product Expiration Date: 202005092328
ISSUE DATE / TIME: 202005090007
Unit Type and Rh: 6200

## 2018-10-20 LAB — PREPARE FRESH FROZEN PLASMA

## 2018-10-20 LAB — GLUCOSE, CAPILLARY
Glucose-Capillary: 112 mg/dL — ABNORMAL HIGH (ref 70–99)
Glucose-Capillary: 134 mg/dL — ABNORMAL HIGH (ref 70–99)

## 2018-10-27 DIAGNOSIS — U071 COVID-19: Secondary | ICD-10-CM

## 2018-10-27 DIAGNOSIS — J1282 Pneumonia due to coronavirus disease 2019: Secondary | ICD-10-CM

## 2018-11-10 NOTE — Progress Notes (Signed)
Patient remains on BiPAP. Sats 20's-40's with good waveform. BP trending down with last reading 87/66. Cardizem drip held. Metoprolol rescheduled to 1000. Patient responds to voice and pain. UOP total 150 for the shift. Patient refused repositioning. No c/o pain. Morphine 2mg  administered x2 per patient request for SOB. Tolerated well.

## 2018-11-10 NOTE — Progress Notes (Signed)
Patient is difficult to arouse. Vigorously stimulated to get response. Would not open eyes only move her hands.

## 2018-11-10 NOTE — Progress Notes (Signed)
CDC referral made. Referral number is [id number]. She is not a donor candidate.

## 2018-11-10 NOTE — Progress Notes (Signed)
[facility] PROGRESS NOTE    Progress Note  Emily Fry  BEE:[id number] DOB: [birth year] DOA: 10/12/2018 PCP: Kirk Ruths, MD     Brief Narrative:   Emily Fry is an 63 y.o. female past medical history of chronic respiratory failure, COPD on 2 L nasal cannula, obstructive sleep apnea with morbid obesity, atrial fibrillation on Coumadin, chronic diastolic heart failure who was transferred [facility] the ED from [facility] with increased shortness of breath and hypoxia recently discharged from the hospital on 10/09/2018 for acute on chronic hypercarbic and hypoxic respiratory failure with 2- COVID test. Presents to the ED with dyspnea that required BiPAP initially and intubated in the ED, her SARS-CoV-2 PCR was positive on this admission.  Status post extubation on 10/14/2018  Events: 10/12/2018 intubated 5/5/ 2020 extubated 10/16/2018: Chest x-ray showed diffuse bilateral airspace disease  Micro: 10/12/2018: COVID-19 positive 10/12/2018: Blood cultures negative 10/12/2018: Tracheal aspirate has remained negative  Medications: 10/16/2018 [place] Assessment/Plan:   Acute on chronic respiratory failure with hypoxia setting of COVID-19 viral infection: At baseline she is on 2 to 3 L nasal cannula. Intubated in the ED status post extubation on 10/14/2018 She is now lethargic on IV morphine drip.  Patient expressed again that she does not want to be intubated. ABG shows hypercarbia now moving to comfort care discontinue labs and unnecessary medications.  Acute metabolic encephalopathy: Encephalopathy likely hypercarbic sleepy and hard to arouse  Chronic atrial fibrillation on Coumadin: We have discontinued her Coumadin as she is unable to take orals.  Acute on chronic diastolic heart failure: We will stop all of her IV medications.  CAD: Continue aspirin and metoprolol.  Type 2 diabetes mellitus with hyperosmolar nonketotic hyperglycemia (HCC) Glucose fairly  controlled, she is just on sliding scale continue current regimen.  Hypokalemia: Potassium has been repleted and has remained above 4.  Hypophosphatemia: Resolved after repletion.  Hypernatremia: Discontinue lab and IV Lasix.   DVT prophylaxis: lovenxo Family Communication:none Disposition Plan/Barrier to D/C: unable to determine Code Status:     Code Status Orders  (From admission, onward)         Start     Ordered   10/12/18 0353  Full code  Continuous     10/12/18 0357        Code Status History    Date Active Date Inactive Code Status Order ID Comments User Context   10/07/2018 2307 10/09/2018 1819 Full Code [id number]  Wilber Oliphant, MD ED   04/14/2018 2246 04/16/2018 1832 Full Code [id number]  Gladstone Lighter, MD Inpatient   08/25/2015 0914 08/28/2015 2013 Full Code [id number]  Saundra Shelling, MD Inpatient        IV Access:    Peripheral IV   Procedures and diagnostic studies:   No results found.   Medical Consultants:    None.  Anti-Infectives:   None  Subjective:    Nareh Matzke is sleepy hard to arouse.  Objective:    Vitals:   Oct 27, 2018 0400 Oct 27, 2018 0500 10/27/2018 0600 10-27-2018 0631  BP: 91/63 (!) 82/64 92/64 (!) 87/66  Pulse: (!) 109 (!) 107 (!) 105 (!) 106  Resp: (!) 24 (!) 25 (!) 23 (!) 23  Temp: 97.6 F (36.4 C)     TempSrc: Axillary     SpO2: (!) 32% (!) 35% (!) 41% (!) 43%  Weight:  (!) 152.2 kg    Height:        Intake/Output Summary (Last 24 hours) at Oct 27, 2018 1031 Last data  filed at Nov 16, 2018 0600 Gross per 24 hour  Intake 360 ml  Output 650 ml  Net -290 ml   Filed Weights   10/17/18 0404 10/18/18 0500 11-16-2018 0500  Weight: (!) 150.6 kg (!) 151.7 kg (!) 152.2 kg    Exam: General exam: Lethargic Respiratory system: Good air movement with crackles at bases bilaterally. Cardiovascular system: Normal rate and rhythm positive S1-S2 Gastrointestinal system: Positive bowel sounds soft nontender nondistended  Central nervous system: She is awake alert and oriented x3 nonfocal. Extremities: Trace edema Skin: No rashes   Data Reviewed:    Labs: Basic Metabolic Panel: Recent Labs  Lab 10/12/18 1142 10/12/18 1700  10/13/18 0545  10/15/18 0323 10/16/18 0225 10/17/18 0224 10/18/18 0158 Nov 16, 2018 0215  NA  --   --    < > 142   < > 149* 150* 148* 145 145  K  --   --    < > 2.7*   < > 2.9* 4.3 4.4 4.0 4.2  CL  --   --   --  100   < > 105 107 104 99 100  CO2  --   --   --  34*   < > 36* 37* 36* 36* 35*  GLUCOSE  --   --   --  112*   < > 137* 200* 165* 132* 159*  BUN  --   --   --  44*   < > 70* 75* 70* 66* 68*  CREATININE  --   --   --  1.12*   < > 1.25* 1.25* 1.22* 1.15* 1.50*  CALCIUM  --   --   --  8.8*   < > 8.0* 8.3* 8.8* 8.7* 8.8*  MG 2.7* 2.1  --  2.0   < > 1.9 2.5* 2.5* 2.2 2.3  PHOS 1.8* 1.2*  --  2.1*  --  3.1  --   --   --   --    < > = values in this interval not displayed.   GFR Estimated Creatinine Clearance: 59 mL/min (A) (by C-G formula based on SCr of 1.5 mg/dL (H)). Liver Function Tests: Recent Labs  Lab 10/17/18 0224  AST 62*  ALT 35  ALKPHOS 75  BILITOT 0.3  PROT 6.0*  ALBUMIN 2.6*   No results for input(s): LIPASE, AMYLASE in the last 168 hours. No results for input(s): AMMONIA in the last 168 hours. Coagulation profile Recent Labs  Lab 10/15/18 0323 10/16/18 0225 10/17/18 0224 10/18/18 0158 Nov 16, 2018 0215  INR 2.1* 1.9* 2.0* 2.1* 2.5*   COVID-19 Labs  Recent Labs    10/17/18 0224 10/18/18 0158 10/18/18 1205 16-Nov-2018 0215  DDIMER  --   --  3.52* 6.47*  FERRITIN 451* 781*  --  862*  CRP 6.5* 2.0*  --  0.9    Lab Results  Component Value Date   SARSCOV2NAA POSITIVE (A) 10/12/2018   [place] NEGATIVE 10/07/2018   SARSCOV2NAA NOT DETECTED 10/07/2018    CBC: Recent Labs  Lab 10/13/18 0545 10/14/18 0515 10/14/18 1423 10/15/18 0323 10/16/18 0225 10/17/18 0224  WBC 5.6 6.4  --  4.0 4.2 5.2  HGB 10.7* 11.1* 11.6* 10.0* 10.1* 10.4*   HCT 36.4 41.5 34.0* 35.0* 36.5 37.1  MCV 95.5 103.0*  --  100.3* 101.1* 101.1*  PLT 166 148*  --  120* 111* 128*   Cardiac Enzymes: No results for input(s): CKTOTAL, CKMB, CKMBINDEX, TROPONINI in the last 168 hours. BNP (last 3 results) No  results for input(s): PROBNP in the last 8760 hours. CBG: Recent Labs  Lab 10/18/18 0753 10/18/18 1217 10/18/18 1545 10/27/18 0012 27-Oct-2018 0335  GLUCAP 116* 119* 108* 126* 148*   D-Dimer: Recent Labs    10/18/18 1205 2018/10/27 0215  DDIMER 3.52* 6.47*   Hgb A1c: No results for input(s): HGBA1C in the last 72 hours. Lipid Profile: No results for input(s): CHOL, HDL, LDLCALC, TRIG, CHOLHDL, LDLDIRECT in the last 72 hours. Thyroid function studies: No results for input(s): TSH, T4TOTAL, T3FREE, THYROIDAB in the last 72 hours.  Invalid input(s): FREET3 Anemia work up: Recent Labs    10/18/18 0158 2018-10-27 0215  FERRITIN 781* 862*   Sepsis Labs: Recent Labs  Lab 10/14/18 0515 10/15/18 0323 10/16/18 0225 10/17/18 0224  WBC 6.4 4.0 4.2 5.2   Microbiology Recent Results (from the past 240 hour(s))  SARS Coronavirus 2 [facility] order, Performed in [place] hospital lab)     Status: Abnormal   Collection Time: 10/12/18 12:11 AM  Result Value Ref Range Status   SARS Coronavirus 2 POSITIVE (A) NEGATIVE Final    Comment: RESULT CALLED TO, READ BACK BY AND VERIFIED WITH: K MUNNET '@[id number]'  10/12/18 BY S GEZAHEGN (NOTE) If result is NEGATIVE SARS-CoV-2 target nucleic acids are NOT DETECTED. The SARS-CoV-2 RNA is generally detectable in upper and lower  respiratory specimens during the acute phase of infection. The lowest  concentration of SARS-CoV-2 viral copies this assay can detect is 250  copies / mL. A negative result does not preclude SARS-CoV-2 infection  and should not be used as the sole basis for treatment or other  patient management decisions.  A negative result may occur with  improper specimen collection / handling,  submission of specimen other  than nasopharyngeal swab, presence of viral mutation(s) within the  areas targeted by this assay, and inadequate number of viral copies  (<250 copies / mL). A negative result must be combined with clinical  observations, patient history, and epidemiological information. If result is POSITIVE SARS-CoV-2 target nucleic acids are DETECTED. T he SARS-CoV-2 RNA is generally detectable in upper and lower  respiratory specimens during the acute phase of infection.  Positive  results are indicative of active infection with SARS-CoV-2.  Clinical  correlation with patient history and other diagnostic information is  necessary to determine patient infection status.  Positive results do  not rule out bacterial infection or co-infection with other viruses. If result is PRESUMPTIVE POSTIVE SARS-CoV-2 nucleic acids MAY BE PRESENT.   A presumptive positive result was obtained on the submitted specimen  and confirmed on repeat testing.  While 2019 novel coronavirus  (SARS-CoV-2) nucleic acids may be present in the submitted sample  additional confirmatory testing may be necessary for epidemiological  and / or clinical management purposes  to differentiate between  SARS-CoV-2 and other Sarbecovirus currently known to infect humans.  If clinically indicated additional testing with an alternate test  methodology [phone]) is  advised. The SARS-CoV-2 RNA is generally  detectable in upper and lower respiratory specimens during the acute  phase of infection. The expected result is Negative. Fact Sheet for Patients:  StrictlyIdeas.no Fact Sheet for Healthcare Providers: BankingDealers.co.za This test is not yet approved or cleared by the [place] FDA and has been authorized for detection and/or diagnosis of SARS-CoV-2 by FDA under an Emergency Use Authorization (EUA).  This EUA will remain in effect (meaning this test can be  used) for the duration of the COVID-19 declaration under Section 564(b)(1) of  the Act, 21 U.S.C. section 360bbb-3(b)(1), unless the authorization is terminated or revoked sooner. Performed at [facility], [place] [address] [place], [place] [address]   Blood Culture (routine x 2)     Status: None   Collection Time: 10/12/18 12:11 AM  Result Value Ref Range Status   Specimen Description BLOOD RIGHT HAND  Final   Special Requests   Final    BOTTLES DRAWN AEROBIC AND ANAEROBIC Blood Culture results may not be optimal due to an inadequate volume of blood received in culture bottles   Culture   Final    NO GROWTH 5 DAYS Performed at [facility], [place] [address], [place], [place] [address]    Report Status 10/17/2018 FINAL  Final  Blood Culture (routine x 2)     Status: None   Collection Time: 10/12/18 12:32 AM  Result Value Ref Range Status   Specimen Description BLOOD SITE NOT SPECIFIED  Final   Special Requests   Final    BOTTLES DRAWN AEROBIC AND ANAEROBIC Blood Culture results may not be optimal due to an inadequate volume of blood received in culture bottles   Culture   Final    NO GROWTH 5 DAYS Performed at [facility], [place] [address] [place], [place] [address]    Report Status 10/17/2018 FINAL  Final  Culture, respiratory (non-expectorated)     Status: None   Collection Time: 10/12/18  8:15 AM  Result Value Ref Range Status   Specimen Description TRACHEAL ASPIRATE  Final   Special Requests NONE  Final   Gram Stain   Final    RARE WBC PRESENT, PREDOMINANTLY PMN NO ORGANISMS SEEN    Culture   Final    RARE Consistent with normal respiratory flora. Performed at [facility], [place] [address] [place], [place] [address]    Report Status 10/14/2018 FINAL  Final     Medications:   . sodium chloride   Intravenous Once  . aspirin  81 mg Oral Daily  . chlorhexidine gluconate (MEDLINE KIT)  15 mL Mouth Rinse BID  . Chlorhexidine Gluconate Cloth  6 each  Topical Daily  . furosemide  60 mg Intravenous Q12H  . insulin aspart  0-20 Units Subcutaneous Q4H  . mouth rinse  15 mL Mouth Rinse BID  . metoprolol tartrate  2.5 mg Intravenous Q6H  . pantoprazole  40 mg Oral QHS  . polyethylene glycol  17 g Oral Daily   Continuous Infusions: . diltiazem (CARDIZEM) infusion    . morphine    . morphine        LOS: 7 days   [facility]  14-Nov-2018, 7:22 AM

## 2018-11-10 NOTE — Discharge Summary (Addendum)
Death Summary  Emily Fry [id number] DOB: Apr 18, 1956 DOA: 2018/10/28  PCP: Lauro Regulus, MD  Admit date: 28-Oct-2018 Date of Death: 2018-11-05 Time of Death: 14:50 Notification: Lauro Regulus, MD notified of death of 11-05-18   History of present illness:  63 y.o. female past medical history of chronic respiratory failure, COPD on 2 L nasal cannula, obstructive sleep apnea with morbid obesity, atrial fibrillation on Coumadin, chronic diastolic heart failure who was transferred to the ED from collapse nursing home facility with increased shortness of breath and hypoxia recently discharged from the hospital on 10/09/2018 for acute on chronic hypercarbic and hypoxic respiratory failure with 2- COVID test.  She was intubated on 11/08/2018 and extubated on 10/14/2018 she was treated for acute decompensated heart failure and successfully extubated.  Final Diagnoses:  Acute on chronic respiratory failure with hypoxia in the setting of COVID-19 infection/pneumonia due to SARS-Cov-2 infection: Patient was successfully extubated on 10/14/2018 tracheal aspirate remain negative. The patient related she did not want to be intubated again, she did talk to her sister and expressed the same concerns to the critical care doctor. After having a discussion with the patient and the daughter she did not wish to have any further aggressive treatment intubated or pressors, she decided to move it to comfort measures. She expressed this to her sister. She was placed on a morphine drip.  Acute encephalopathy: Likely due to hypercarbic respiratory failure.  Chronic atrial fibrillation: They were stopped after she decided to move towards comfort care.  Acute on chronic diastolic heart failure: On admission she was intubated, she was diuresed over 5 L she had good urine output and successfully extubated.  CAD: Diabetes mellitus type 2 with hyperosmolar nonketotic  hyperglycemia  Hypokalemia  Hypernatremia   The results of significant diagnostics from this hospitalization (including imaging, microbiology, ancillary and laboratory) are listed below for reference.    Significant Diagnostic Studies: Dg Abd 1 View  Result Date: 10/14/2018 CLINICAL DATA:  Feeding tube placement EXAM: ABDOMEN - 1 VIEW COMPARISON:  10/12/2018 FINDINGS: The tip of the enteric tube projects over the duodenum. The tip is pointed distally. The bowel gas pattern is nonspecific. IMPRESSION: Tip of the enteric tube projects over the duodenum. Electronically Signed   By: Katherine Mantle M.D.   On: 10/14/2018 15:03   Dg Abd 1 View  Result Date: 10/12/2018 CLINICAL DATA:  Enteric tube placement. EXAM: ABDOMEN - 1 VIEW COMPARISON:  CT abdomen pelvis dated May 08, 2014. FINDINGS: Enteric tube tip in the stomach. The visualized bowel gas pattern is normal. No radio-opaque calculi or other significant radiographic abnormality are seen. No acute osseous abnormality. IMPRESSION: 1. Enteric tube in the stomach. Electronically Signed   By: Obie Dredge M.D.   On: 10/12/2018 06:24   Ct Head Wo Contrast  Result Date: 10/07/2018 CLINICAL DATA:  Seizure.  Bilateral hand twitching EXAM: CT HEAD WITHOUT CONTRAST TECHNIQUE: Contiguous axial images were obtained from the base of the skull through the vertex without intravenous contrast. COMPARISON:  None. FINDINGS: Brain: Ventricle size and cerebral volume normal. Negative for acute infarct. Negative for hemorrhage or mass. Vascular: Negative for hyperdense vessel Skull: Negative Sinuses/Orbits: Bilateral exophthalmos. No orbital mass. Paranasal sinuses clear. Other: None IMPRESSION: No acute intracranial abnormality Exophthalmos. Electronically Signed   By: Marlan Palau M.D.   On: 10/07/2018 17:10   Dg Chest Port 1 View  Result Date: 10/15/2018 CLINICAL DATA:  Hypoxia EXAM: PORTABLE CHEST 1 VIEW COMPARISON:  Yesterday FINDINGS: Endotracheal  tube tip between the clavicular heads and carina. The orogastric tube at least reaches the stomach. Bilateral airspace disease without convincing change from prior. Cardiomegaly. No definite effusion. No pneumothorax. New PICC on the left with tip near the SVC origin. IMPRESSION: 1. New PICC with tip near the SVC origin. 2. Endotracheal and orogastric tubes in expected position. 3. Cardiomegaly and bilateral airspace disease without significant change from prior. Electronically Signed   By: Marnee Spring M.D.   On: 10/15/2018 11:12   Dg Chest Port 1 View  Result Date: 10/14/2018 CLINICAL DATA:  Viral pneumonia EXAM: PORTABLE CHEST 1 VIEW COMPARISON:  Chest x-ray dated 10/13/2018 FINDINGS: The endotracheal tube terminates above the carina. Enteric tube extends below the left hemidiaphragm. The cardiac silhouette is significantly enlarged. Diffuse bilateral hazy airspace opacities are again noted. There is no pneumothorax. There may be small bilateral pleural effusions. IMPRESSION: No significant interval change. Electronically Signed   By: Katherine Mantle M.D.   On: 10/14/2018 15:02   Dg Chest Port 1 View  Result Date: 10/13/2018 CLINICAL DATA:  Intubation EXAM: PORTABLE CHEST 1 VIEW COMPARISON:  Yesterday FINDINGS: Endotracheal tube tip between the clavicular heads and carina. The orogastric tube at least reaches the stomach. Cardiomegaly and vascular pedicle widening. Low lung volumes with diffuse pulmonary opacification that is similar to prior. No air leak or definite/increasing pleural fluid. IMPRESSION: Unchanged hardware positioning and bilateral airspace disease. Electronically Signed   By: Marnee Spring M.D.   On: 10/13/2018 08:04   Dg Chest Port 1 View  Result Date: 10/12/2018 CLINICAL DATA:  Endotracheal tube placement. EXAM: PORTABLE CHEST 1 VIEW COMPARISON:  Chest x-ray from same day at 3:36 a.m. FINDINGS: Endotracheal tube in place with the tip now 8 mm above the carina. Enteric tube in  the stomach. Stable cardiomegaly. Persistent low lung volumes with moderate pulmonary edema and small bilateral pleural effusions. Slightly improved aeration at the right lung base. Persistent dense retrocardiac opacification, likely atelectasis. No pneumothorax. No acute osseous abnormality. IMPRESSION: 1. Endotracheal tube tip now 8 mm above the carina. Consider retracting an additional 2 cm. 2. Unchanged congestive heart failure. Slightly improved aeration at the right lung base. Electronically Signed   By: Obie Dredge M.D.   On: 10/12/2018 06:28   Dg Chest Port 1 View  Result Date: 10/12/2018 CLINICAL DATA:  Follow-up examination status post endotracheal tube repositioning. EXAM: PORTABLE CHEST 1 VIEW COMPARISON:  Prior radiograph earlier same day. FINDINGS: Tip of the endotracheal tube still remains deep within the right mainstem bronchus. Retraction by approximately 3.5 cm recommended. Remainder of the examination remains unchanged. IMPRESSION: Tip of endotracheal tube remains deep within the right mainstem bronchus. Retraction by approximately 3-3.5 cm recommended. Critical Value/emergent results were called by telephone at the time of interpretation on 10/12/2018 at 4:51 am to Dr. Dione Booze , who verbally acknowledged these results. Electronically Signed   By: Rise Mu M.D.   On: 10/12/2018 04:51   Dg Chest Port 1 View  Result Date: 10/12/2018 CLINICAL DATA:  Initial evaluation for intubation. EXAM: PORTABLE CHEST 1 VIEW COMPARISON:  Prior radiograph from earlier the same day. FINDINGS: Endotracheal tube in place, position deep with tip in the right mainstem bronchus. At time of this dictation, a second film has been obtained. Tip still appears to be within the right mainstem bronchus. Enteric tube courses into the abdomen. Stable cardiomegaly. Low lung volumes. Pulmonary edema with bilateral pleural effusions again seen, similar to previous. Probable superimposed bibasilar atelectasis.  No pneumothorax. Osseous structures unchanged. IMPRESSION: 1. Tip of endotracheal tube positioned within the right mainstem bronchus. At time of this dictation, a second filled with a repositioned tube has already been acquired. 2. Diffuse pulmonary edema and small bilateral pleural effusions, suggesting CHF. 3. Probable superimposed bibasilar atelectasis. 4. Electronically Signed   By: Rise Mu M.D.   On: 10/12/2018 04:47   Dg Chest Port 1 View  Result Date: 10/12/2018 CLINICAL DATA:  Altered mental status, shortness of breath EXAM: PORTABLE CHEST 1 VIEW COMPARISON:  10/07/2018 FINDINGS: Cardiomegaly. Diffuse bilateral airspace disease, favor edema/CHF. Suspect layering effusions. Very low lung volumes. No acute bony abnormality. IMPRESSION: Cardiomegaly with severe diffuse bilateral airspace disease and suspected layering effusions, likely edema/CHF. Low lung volumes. Electronically Signed   By: Charlett Nose M.D.   On: 10/12/2018 01:01   Dg Chest Portable 1 View  Result Date: 10/07/2018 CLINICAL DATA:  Tremor.  Cough. EXAM: PORTABLE CHEST 1 VIEW COMPARISON:  04/14/2018 and 05/17/2008 FINDINGS: There is abnormal increased density at the left lung base, new since the prior study. Heart size and pulmonary vascularity are normal. There is a new small focal area of linear atelectasis in the right upper lobe. No effusions. IMPRESSION: Area of new infiltrate at the left lung base. Minimal atelectasis in the right upper lobe. Electronically Signed   By: Francene Boyers M.D.   On: 10/07/2018 17:14   [place] Ekg Site Rite  Result Date: 10/14/2018 If Site Rite image not attached, placement could not be confirmed due to current cardiac rhythm.   Microbiology: Recent Results (from the past 240 hour(s))  SARS Coronavirus 2 [facility] order, Performed in [facility] hospital lab)     Status: Abnormal   Collection Time: 10/12/18 12:11 AM  Result Value Ref Range Status   SARS Coronavirus 2 POSITIVE (A)  NEGATIVE Final    Comment: RESULT CALLED TO, READ BACK BY AND VERIFIED WITH: K MUNNET  10/12/18 BY S GEZAHEGN (NOTE) If result is NEGATIVE SARS-CoV-2 target nucleic acids are NOT DETECTED. The SARS-CoV-2 RNA is generally detectable in upper and lower  respiratory specimens during the acute phase of infection. The lowest  concentration of SARS-CoV-2 viral copies this assay can detect is 250  copies / mL. A negative result does not preclude SARS-CoV-2 infection  and should not be used as the sole basis for treatment or other  patient management decisions.  A negative result may occur with  improper specimen collection / handling, submission of specimen other  than nasopharyngeal swab, presence of viral mutation(s) within the  areas targeted by this assay, and inadequate number of viral copies  (<250 copies / mL). A negative result must be combined with clinical  observations, patient history, and epidemiological information. If result is POSITIVE SARS-CoV-2 target nucleic acids are DETECTED. T he SARS-CoV-2 RNA is generally detectable in upper and lower  respiratory specimens during the acute phase of infection.  Positive  results are indicative of active infection with SARS-CoV-2.  Clinical  correlation with patient history and other diagnostic information is  necessary to determine patient infection status.  Positive results do  not rule out bacterial infection or co-infection with other viruses. If result is PRESUMPTIVE POSTIVE SARS-CoV-2 nucleic acids MAY BE PRESENT.   A presumptive positive result was obtained on the submitted specimen  and confirmed on repeat testing.  While 2019 novel coronavirus  (SARS-CoV-2) nucleic acids may be present in the submitted sample  additional confirmatory testing may be necessary for epidemiological  and / or clinical management purposes  to differentiate between  SARS-CoV-2 and other Sarbecovirus currently known to infect humans.  If  clinically indicated additional testing with an alternate test  methodology [phone]) is  advised. The SARS-CoV-2 RNA is generally  detectable in upper and lower respiratory specimens during the acute  phase of infection. The expected result is Negative. Fact Sheet for Patients:  BoilerBrush.com.cy Fact Sheet for Healthcare Providers: https://pope.com/ This test is not yet approved or cleared by the [place] FDA and has been authorized for detection and/or diagnosis of SARS-CoV-2 by FDA under an Emergency Use Authorization (EUA).  This EUA will remain in effect (meaning this test can be used) for the duration of the COVID-19 declaration under Section 564(b)(1) of the Act, 21 U.S.C. section 360bbb-3(b)(1), unless the authorization is terminated or revoked sooner. Performed at [facility], [address] [place], [place] [address]   Blood Culture (routine x 2)     Status: None   Collection Time: 10/12/18 12:11 AM  Result Value Ref Range Status   Specimen Description BLOOD RIGHT HAND  Final   Special Requests   Final    BOTTLES DRAWN AEROBIC AND ANAEROBIC Blood Culture results may not be optimal due to an inadequate volume of blood received in culture bottles   Culture   Final    NO GROWTH 5 DAYS Performed at [facility], [address], [place], [place] [address]    Report Status 10/17/2018 FINAL  Final  Blood Culture (routine x 2)     Status: None   Collection Time: 10/12/18 12:32 AM  Result Value Ref Range Status   Specimen Description BLOOD SITE NOT SPECIFIED  Final   Special Requests   Final    BOTTLES DRAWN AEROBIC AND ANAEROBIC Blood Culture results may not be optimal due to an inadequate volume of blood received in culture bottles   Culture   Final    NO GROWTH 5 DAYS Performed at [facility], [address] [place], [place] [address]    Report Status 10/17/2018 FINAL  Final  Culture, respiratory  (non-expectorated)     Status: None   Collection Time: 10/12/18  8:15 AM  Result Value Ref Range Status   Specimen Description TRACHEAL ASPIRATE  Final   Special Requests NONE  Final   Gram Stain   Final    RARE WBC PRESENT, PREDOMINANTLY PMN NO ORGANISMS SEEN    Culture   Final    RARE Consistent with normal respiratory flora. Performed at [facility], [address], [place], [place] [address]    Report Status 10/14/2018 FINAL  Final     Labs: Basic Metabolic Panel: Recent Labs  Lab 10/12/18 1700  10/13/18 0545  10/15/18 0865 10/16/18 0225 10/17/18 0224 10/18/18 0158 10/28/2018 0215 10/20/2018 0752  NA  --    < > 142   < > 149* 150* 148* 145 145 143  K  --    < > 2.7*   < > 2.9* 4.3 4.4 4.0 4.2 4.5  CL  --   --  100   < > 105 107 104 99 100  --   CO2  --   --  34*   < > 36* 37* 36* 36* 35*  --   GLUCOSE  --   --  112*   < > 137* 200* 165* 132* 159*  --   BUN  --   --  44*   < >  70* 75* 70* 66* 68*  --   CREATININE  --   --  1.12*   < > 1.25* 1.25* 1.22* 1.15* 1.50*  --   CALCIUM  --   --  8.8*   < > 8.0* 8.3* 8.8* 8.7* 8.8*  --   MG 2.1  --  2.0   < > 1.9 2.5* 2.5* 2.2 2.3  --   PHOS 1.2*  --  2.1*  --  3.1  --   --   --   --   --    < > = values in this interval not displayed.   Liver Function Tests: Recent Labs  Lab 10/17/18 0224  AST 62*  ALT 35  ALKPHOS 75  BILITOT 0.3  PROT 6.0*  ALBUMIN 2.6*   No results for input(s): LIPASE, AMYLASE in the last 168 hours. No results for input(s): AMMONIA in the last 168 hours. CBC: Recent Labs  Lab 10/13/18 0545 10/14/18 0515 10/14/18 1423 10/15/18 0323 10/16/18 0225 10/17/18 0224 03-22-19 0752  WBC 5.6 6.4  --  4.0 4.2 5.2  --   HGB 10.7* 11.1* 11.6* 10.0* 10.1* 10.4* 12.9  HCT 36.4 41.5 34.0* 35.0* 36.5 37.1 38.0  MCV 95.5 103.0*  --  100.3* 101.1* 101.1*  --   PLT 166 148*  --  120* 111* 128*  --    Cardiac Enzymes: No results for input(s): CKTOTAL, CKMB, CKMBINDEX, TROPONINI in the last 168  hours. D-Dimer Recent Labs    10/18/18 1205 03-22-19 0215  DDIMER 3.52* 6.47*   BNP: Invalid input(s): POCBNP CBG: Recent Labs  Lab 10/18/18 1217 10/18/18 1545 03-22-19 0012 03-22-19 0335 03-22-19 0754  GLUCAP 119* 108* 126* 148* 125*   Anemia work up Recent Labs    10/18/18 0158 03-22-19 0215  FERRITIN 781* 862*   Urinalysis    Component Value Date/Time   COLORURINE YELLOW 10/12/2018 0530   APPEARANCEUR CLEAR 10/12/2018 0530   APPEARANCEUR Cloudy 05/07/2014 1430   LABSPEC 1.009 10/12/2018 0530   LABSPEC 1.017 05/07/2014 1430   PHURINE 5.0 10/12/2018 0530   GLUCOSEU NEGATIVE 10/12/2018 0530   GLUCOSEU Negative 05/07/2014 1430   HGBUR MODERATE (A) 10/12/2018 0530   BILIRUBINUR NEGATIVE 10/12/2018 0530   BILIRUBINUR Negative 05/07/2014 1430   KETONESUR NEGATIVE 10/12/2018 0530   PROTEINUR NEGATIVE 10/12/2018 0530   NITRITE NEGATIVE 10/12/2018 0530   LEUKOCYTESUR NEGATIVE 10/12/2018 0530   LEUKOCYTESUR Negative 05/07/2014 1430   Sepsis Labs Invalid input(s): PROCALCITONIN,  WBC,  LACTICIDVEN     SIGNED:  Marinda ElkAbraham Feliz Ortiz, MD  [facility] 02/16/2019, 3:59 PM Pager   If 7PM-7AM, please contact night-coverage [email or web address] Password [email or web address]

## 2018-11-10 NOTE — Progress Notes (Signed)
Patient remains on BiPAP sats 20's-30's. SpO2 lead moved from ear to finger at 0100 with RT present. Better waveform noted. Responds to voice and touch. Continues with appropriate questions, requests, and responses.

## 2018-11-10 NOTE — Progress Notes (Signed)
NAME:  Emily Fry, MRN:  [id number], DOB:  Jul 27, 1955, LOS: 7 ADMISSION DATE:  10/13/18, CONSULTATION DATE:  5/6 REFERRING MD:  Dr. Preston Fleeting, CHIEF COMPLAINT:  Dyspnea   Brief History   63 year old female came from collapse nursing home with increasing shortness of breath and hypoxemia/COVID 19 on Oct 12, 2018.  Required intubation in the emergency room.   Past Medical History  COPD, obstructive sleep apnea, chronic respiratory failure with hypoxemia, A. fib, allergic rhinitis, bilateral osteoarthritis of knee, diastolic heart failure, gastroesophageal reflux disease, gout, hypertension, morbid obesity  Significant Hospital Events   May 2 admitted May 5 extubated, required reintubation for hypoxemia, hypercarbia 5/8 placed on BIPAP, worsening hypoxemia, turned down intubation, made DNR  Consults:    Procedures:  May 3 ETT > 5/5, 5/5 > 5/7  Significant Diagnostic Tests:    Micro Data:  5/3 BCx 2 >> 5/3 COVID 19 >> POSITIVE 5/3 trach asp >>  Antimicrobials/COVID  May 7 Actemra May 8 Actemra again, convalescent plasma  Interim history/subjective:   Worsening oxygenation overnight More difficult to arouse this AM  Objective   Blood pressure 110/72, pulse (!) 111, temperature 97.6 F (36.4 C), temperature source Axillary, resp. rate (!) 30, height 5\' 4"  (1.626 m), weight (!) 152.2 kg, SpO2 (!) 50 %.    Vent Mode: BIPAP;PCV FiO2 (%):  [100 %] 100 % Set Rate:  [16 bmp] 16 bmp PEEP:  [8 cmH20-12 cmH20] 12 cmH20   Intake/Output Summary (Last 24 hours) at 10/27/2018 0759 Last data filed at 10/29/2018 0600 Gross per 24 hour  Intake 360 ml  Output 650 ml  Net -290 ml   Filed Weights   10/17/18 0404 10/18/18 0500 10/25/2018 0500  Weight: (!) 150.6 kg (!) 151.7 kg (!) 152.2 kg    Examination:  General:  Chronically ill appearing female, on BiPAP but not terribly arousable HENT: [place]/AT, on BiPAP PULM: Decreased BS diffusely CV: RRR, Nl S1/S2 and -M/R/G GI: Soft,  obese, NT and ND MSK: normal bulk and tone Neuro: Lethargic, not speaking this AM   Resolved Hospital Problem list     Assessment & Plan:  Acute respiratory failure with hypoxemia initially due to acute pulmonary edema from diastolic heart failure exacerbation: severely worsening due to COVID [id number] Hold further diureses Transition to comfort D/C BiPAP once mental status deteriorates D/C diet given mental status DNR Low dose morphine drip  Chronic hypercarbic respiratory failure due to obesity hypoventilation syndrome D/C BiPAP once mental status deteriorates  COVID-19: With worsening oxygenation I am concerned that she has COVID pneumonia Maintain in ICU Will likely expire today  Denying pain, refusing morphine drip, wants BiPAP on  Best practice:  Diet: regular diet Pain/Anxiety/Delirium protocol (if indicated): d/c VAP protocol (if indicated): n/a DVT prophylaxis: warfarin GI prophylaxis: PPI Glucose control: SSI Mobility: PT consult, out of bed Code Status: DNR Family Communication: Updated sister Emily Fry at length today, she understands the patient's decision to be DNR Disposition: remain in ICU  Labs   CBC: Recent Labs  Lab 10/13/18 0545 10/14/18 0515 10/14/18 1423 10/15/18 0323 10/16/18 0225 10/17/18 0224 10/17/2018 0752  WBC 5.6 6.4  --  4.0 4.2 5.2  --   HGB 10.7* 11.1* 11.6* 10.0* 10.1* 10.4* 12.9  HCT 36.4 41.5 34.0* 35.0* 36.5 37.1 38.0  MCV 95.5 103.0*  --  100.3* 101.1* 101.1*  --   PLT 166 148*  --  120* 111* 128*  --     Basic Metabolic Panel: Recent Labs  Lab 10/12/18 1142 10/12/18 1700  10/13/18 0545  10/15/18 0323 10/16/18 0225 10/17/18 0224 10/18/18 0158 04-Nov-2018 0215 11/04/18 0752  NA  --   --    < > 142   < > 149* 150* 148* 145 145 143  K  --   --    < > 2.7*   < > 2.9* 4.3 4.4 4.0 4.2 4.5  CL  --   --   --  100   < > 105 107 104 99 100  --   CO2  --   --   --  34*   < > 36* 37* 36* 36* 35*  --   GLUCOSE  --   --   --  112*    < > 137* 200* 165* 132* 159*  --   BUN  --   --   --  44*   < > 70* 75* 70* 66* 68*  --   CREATININE  --   --   --  1.12*   < > 1.25* 1.25* 1.22* 1.15* 1.50*  --   CALCIUM  --   --   --  8.8*   < > 8.0* 8.3* 8.8* 8.7* 8.8*  --   MG 2.7* 2.1  --  2.0   < > 1.9 2.5* 2.5* 2.2 2.3  --   PHOS 1.8* 1.2*  --  2.1*  --  3.1  --   --   --   --   --    < > = values in this interval not displayed.   GFR: Estimated Creatinine Clearance: 59 mL/min (A) (by C-G formula based on SCr of 1.5 mg/dL (H)). Recent Labs  Lab 10/14/18 0515 10/15/18 0323 10/16/18 0225 10/17/18 0224  WBC 6.4 4.0 4.2 5.2    Liver Function Tests: Recent Labs  Lab 10/17/18 0224  AST 62*  ALT 35  ALKPHOS 75  BILITOT 0.3  PROT 6.0*  ALBUMIN 2.6*   No results for input(s): LIPASE, AMYLASE in the last 168 hours. No results for input(s): AMMONIA in the last 168 hours.  ABG    Component Value Date/Time   PHART 7.217 (L) 2018-11-04 0752   PCO2ART 91.1 (HH) 2018/11/04 0752   PO2ART 36.0 (LL) Nov 04, 2018 0752   HCO3 37.1 (H) 04-Nov-2018 0752   TCO2 40 (H) 2018-11-04 0752   O2SAT 54.0 11/04/18 0752     Coagulation Profile: Recent Labs  Lab 10/15/18 0323 10/16/18 0225 10/17/18 0224 10/18/18 0158 11-04-18 0215  INR 2.1* 1.9* 2.0* 2.1* 2.5*    Cardiac Enzymes: No results for input(s): CKTOTAL, CKMB, CKMBINDEX, TROPONINI in the last 168 hours.  HbA1C: Hgb A1c MFr Bld  Date/Time Value Ref Range Status  08/25/2015 02:14 AM 11.9 (H) 4.0 - 6.0 % Final    CBG: Recent Labs  Lab 10/18/18 0753 10/18/18 1217 10/18/18 1545 11/04/2018 0012 2018/11/04 0335  GLUCAP 116* 119* 108* 126* 148*   The patient is critically ill with multiple organ systems failure and requires high complexity decision making for assessment and support, frequent evaluation and titration of therapies, application of advanced monitoring technologies and extensive interpretation of multiple databases.   Critical Care Time devoted to patient care  services described in this note is  32  Minutes. This time reflects time of care of this signee Dr Koren Bound. This critical care time does not reflect procedure time, or teaching time or supervisory time of PA/NP/Med student/Med Resident etc but could involve care discussion time.  Rush Farmer, M.D. [facility] Pulmonary/Critical Care Medicine. Pager: [phone]. After hours pager: [phone].

## 2018-11-10 NOTE — Progress Notes (Signed)
Patient remains on BiPAP sats 45-62. No c/o pain.Rejects morphine drip at this time, but agreeable to prn IV push per as ordered. Patient with appropriate questions, requests, and responses. Continues to be engaged in care.

## 2018-11-10 NOTE — Care Management (Signed)
10/20/18  8:55am Case manager accessed chart. May God Rest Her Soul.   Vance Peper, RN BSN Case Manager  [phone]

## 2018-11-10 DEATH — deceased

## 2019-07-20 IMAGING — CT CT HEAD WITHOUT CONTRAST
4 of 6 series · 18 of 47 positions shown, 19 images · non-contrast
Comparison: None.

CLINICAL DATA: Seizure.  Bilateral hand twitching

EXAM:
CT HEAD WITHOUT CONTRAST
TECHNIQUE: Contiguous axial images were obtained from the base of the skull
through the vertex without intravenous contrast.

[Series 3: head without · axial · non-contrast · 0.43mm/px · z∈[+183,+278]mm · 4 of 33 slices shown, 5 images]
[im 7/33  brain]
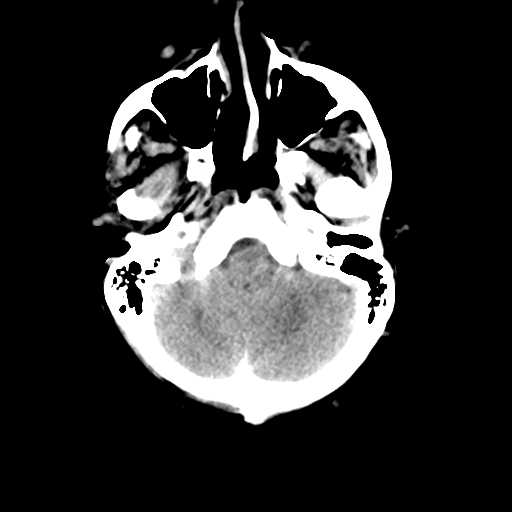
[im 7/33  bone]
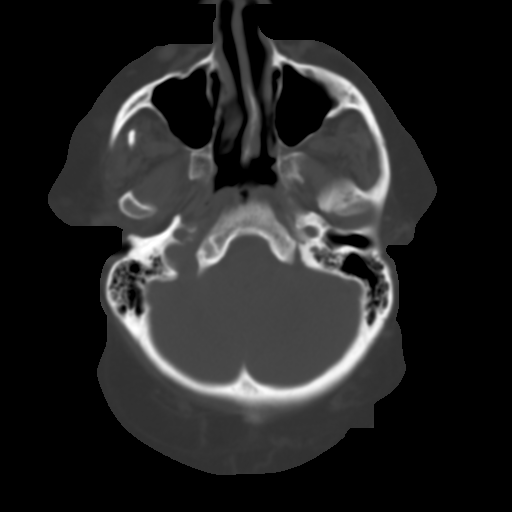
[im 13/33  brain]
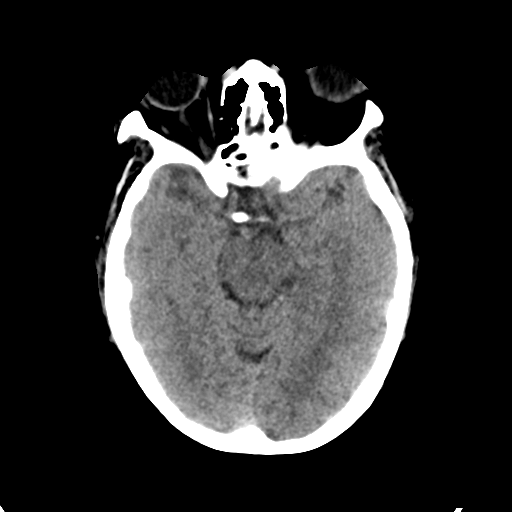
[im 20/33  brain]
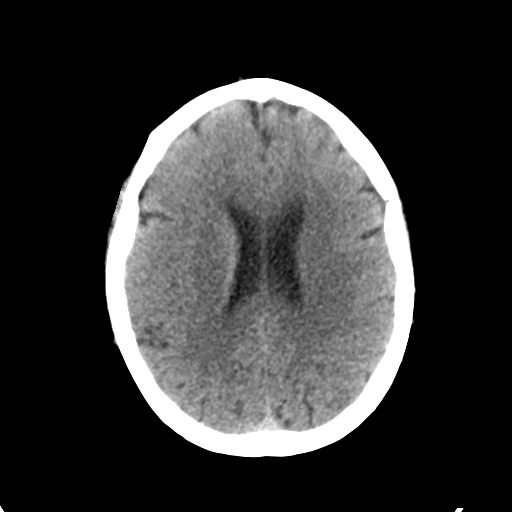
[im 26/33  brain]
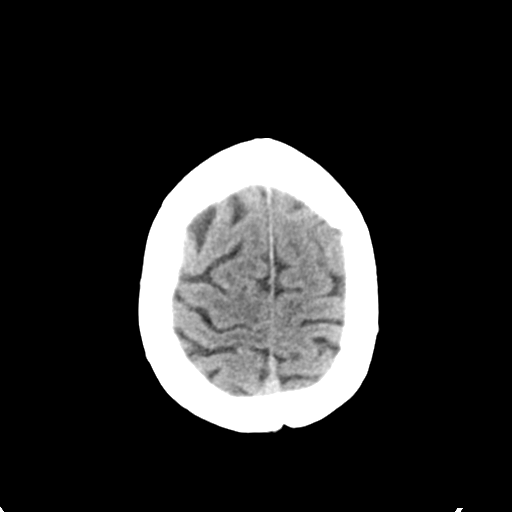

[Series 4: head bone · axial · 0.43mm/px · z∈[+165,+299]mm · 8 of 81 slices shown]
[im 7/81  bone]
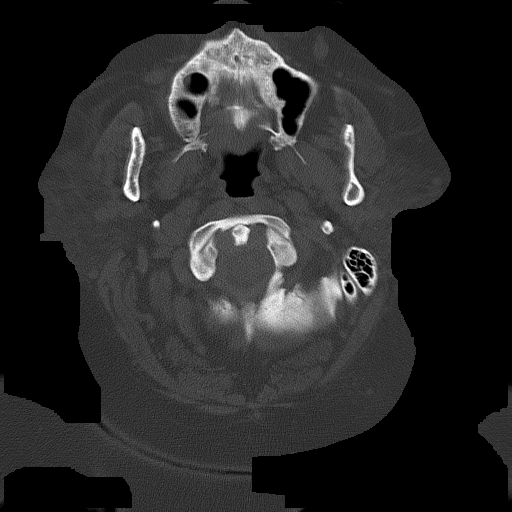
[im 21/81  bone]
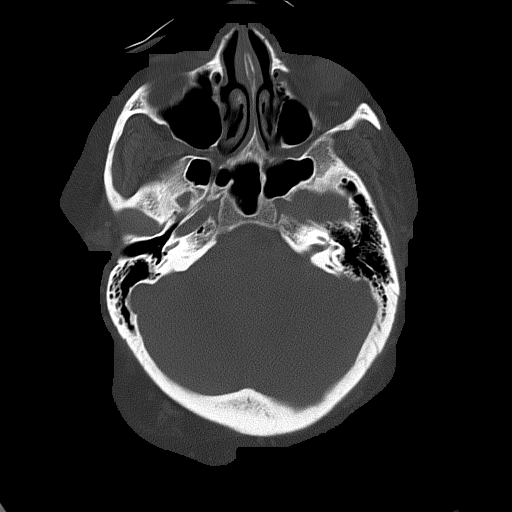
[im 27/81  bone]
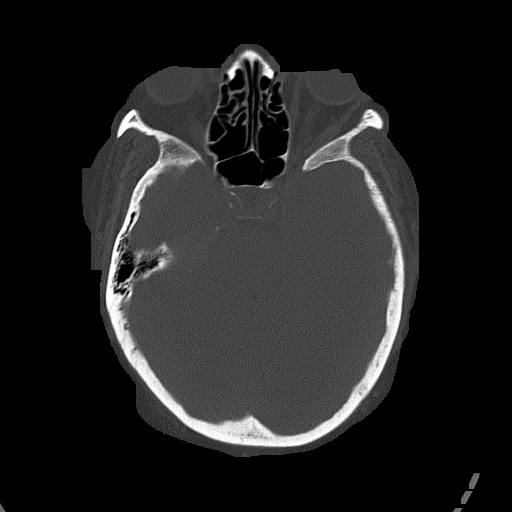
[im 34/81  bone]
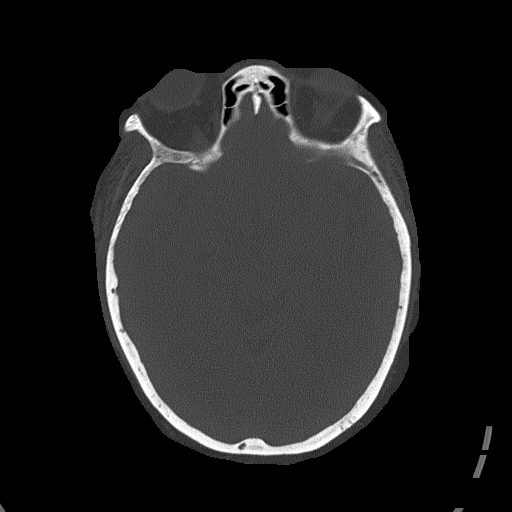
[im 47/81  bone]
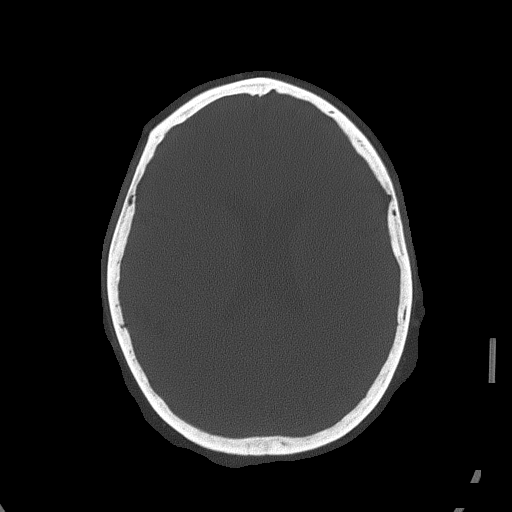
[im 54/81  bone]
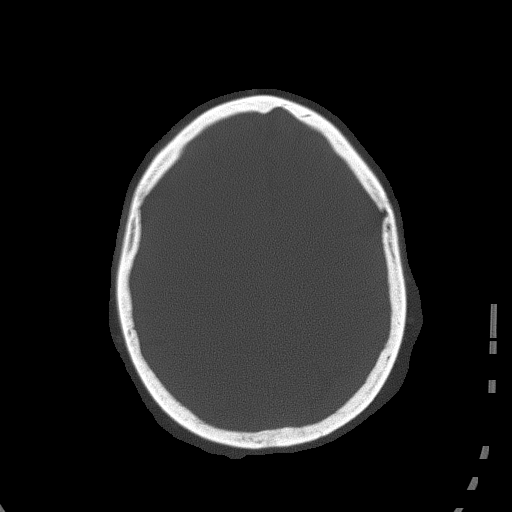
[im 61/81  bone]
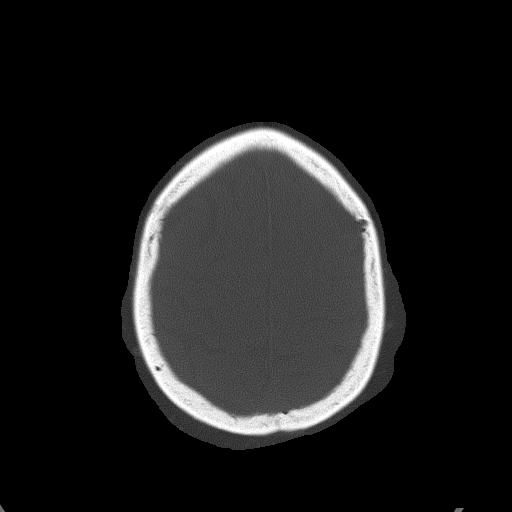
[im 74/81  bone]
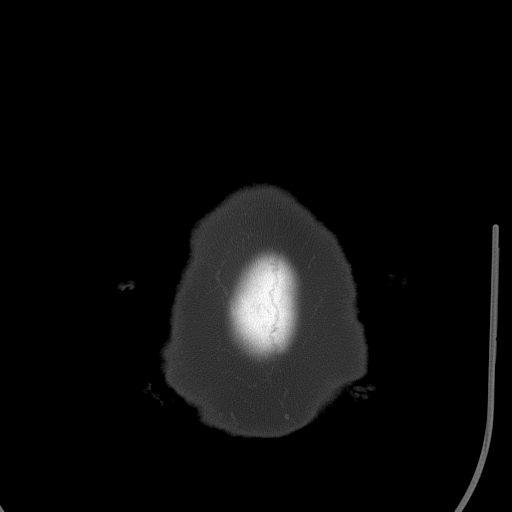

[Series 5: head without cor · coronal · non-contrast · 0.31mm/px · 3 of 67 slices shown]
[im 23/67  brain]
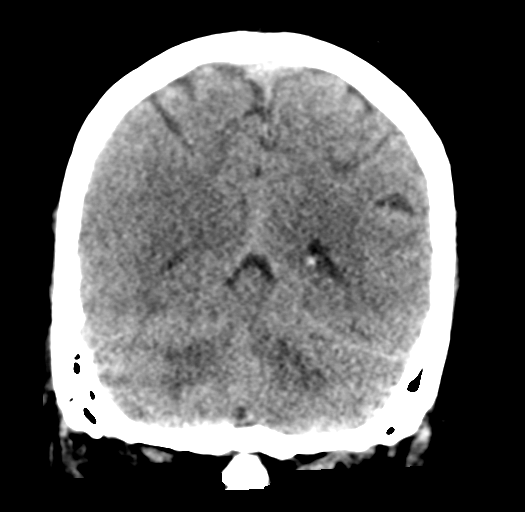
[im 30/67  brain]
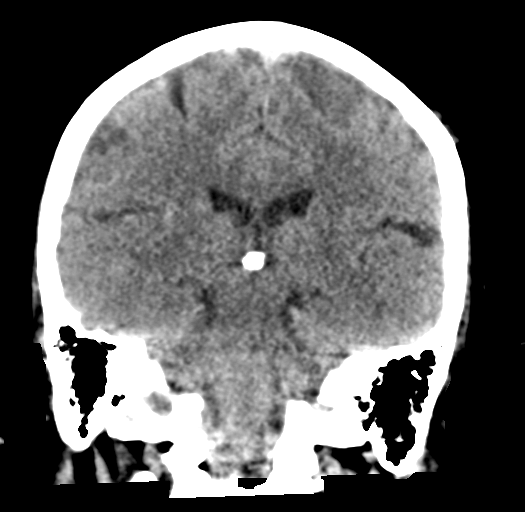
[im 37/67  brain]
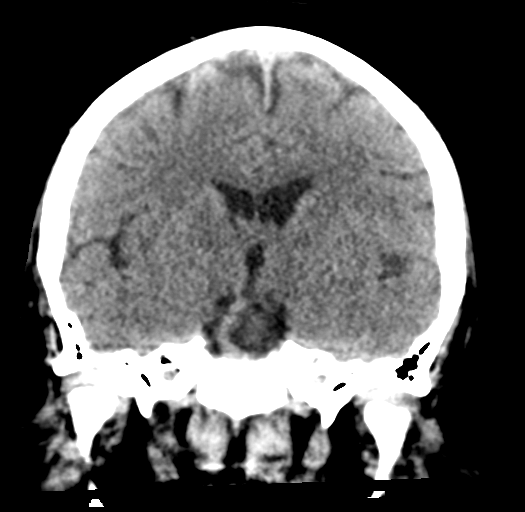

[Series 6: head without sag · sagittal · non-contrast · 0.29mm/px · 3 of 50 slices shown]
[im 17/50  brain]
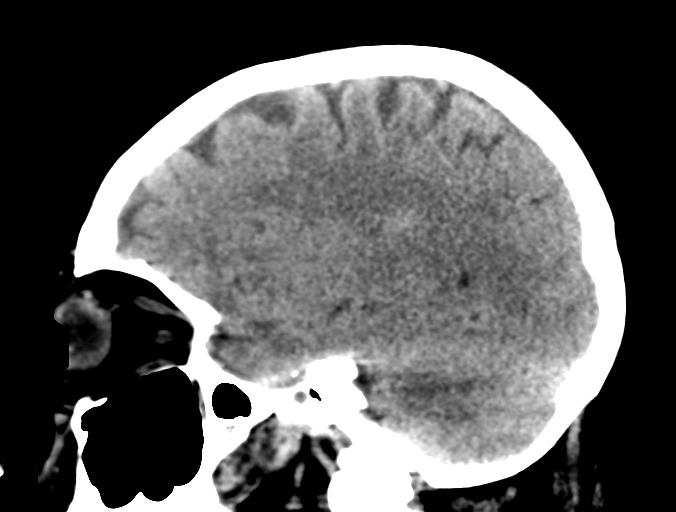
[im 25/50  brain]
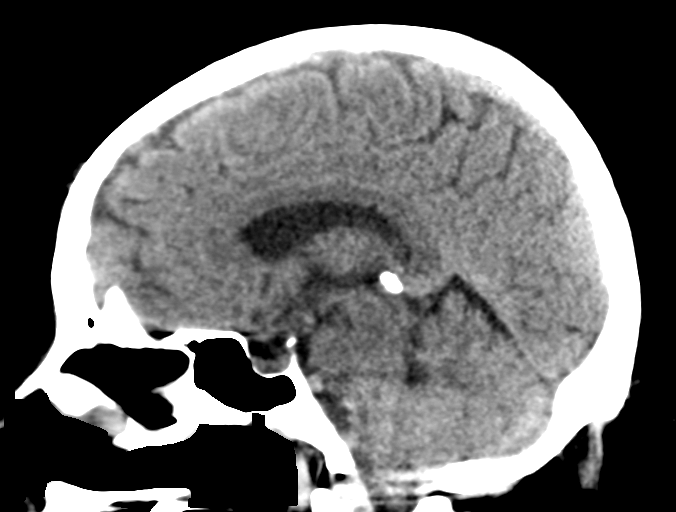
[im 33/50  brain]
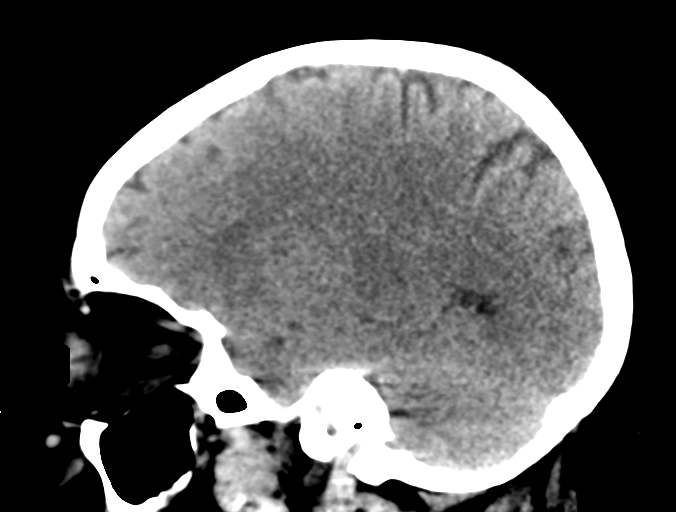

[18 of 47 positions shown; findings below may reference images not displayed]

FINDINGS: Brain: Ventricle size and cerebral volume normal. Negative for acute
infarct. Negative for hemorrhage or mass.

Vascular: Negative for hyperdense vessel

Skull: Negative

Sinuses/Orbits: Bilateral exophthalmos. No orbital mass. Paranasal
sinuses clear.

Other: None
IMPRESSION: No acute intracranial abnormality

Exophthalmos.

## 2019-07-25 IMAGING — DX PORTABLE CHEST - 1 VIEW
1 series · 1 of 1 positions shown · non-contrast
Comparison: Prior radiograph earlier same day.

CLINICAL DATA: Follow-up examination status post endotracheal tube
repositioning.

EXAM:
PORTABLE CHEST 1 VIEW

[chest ap]
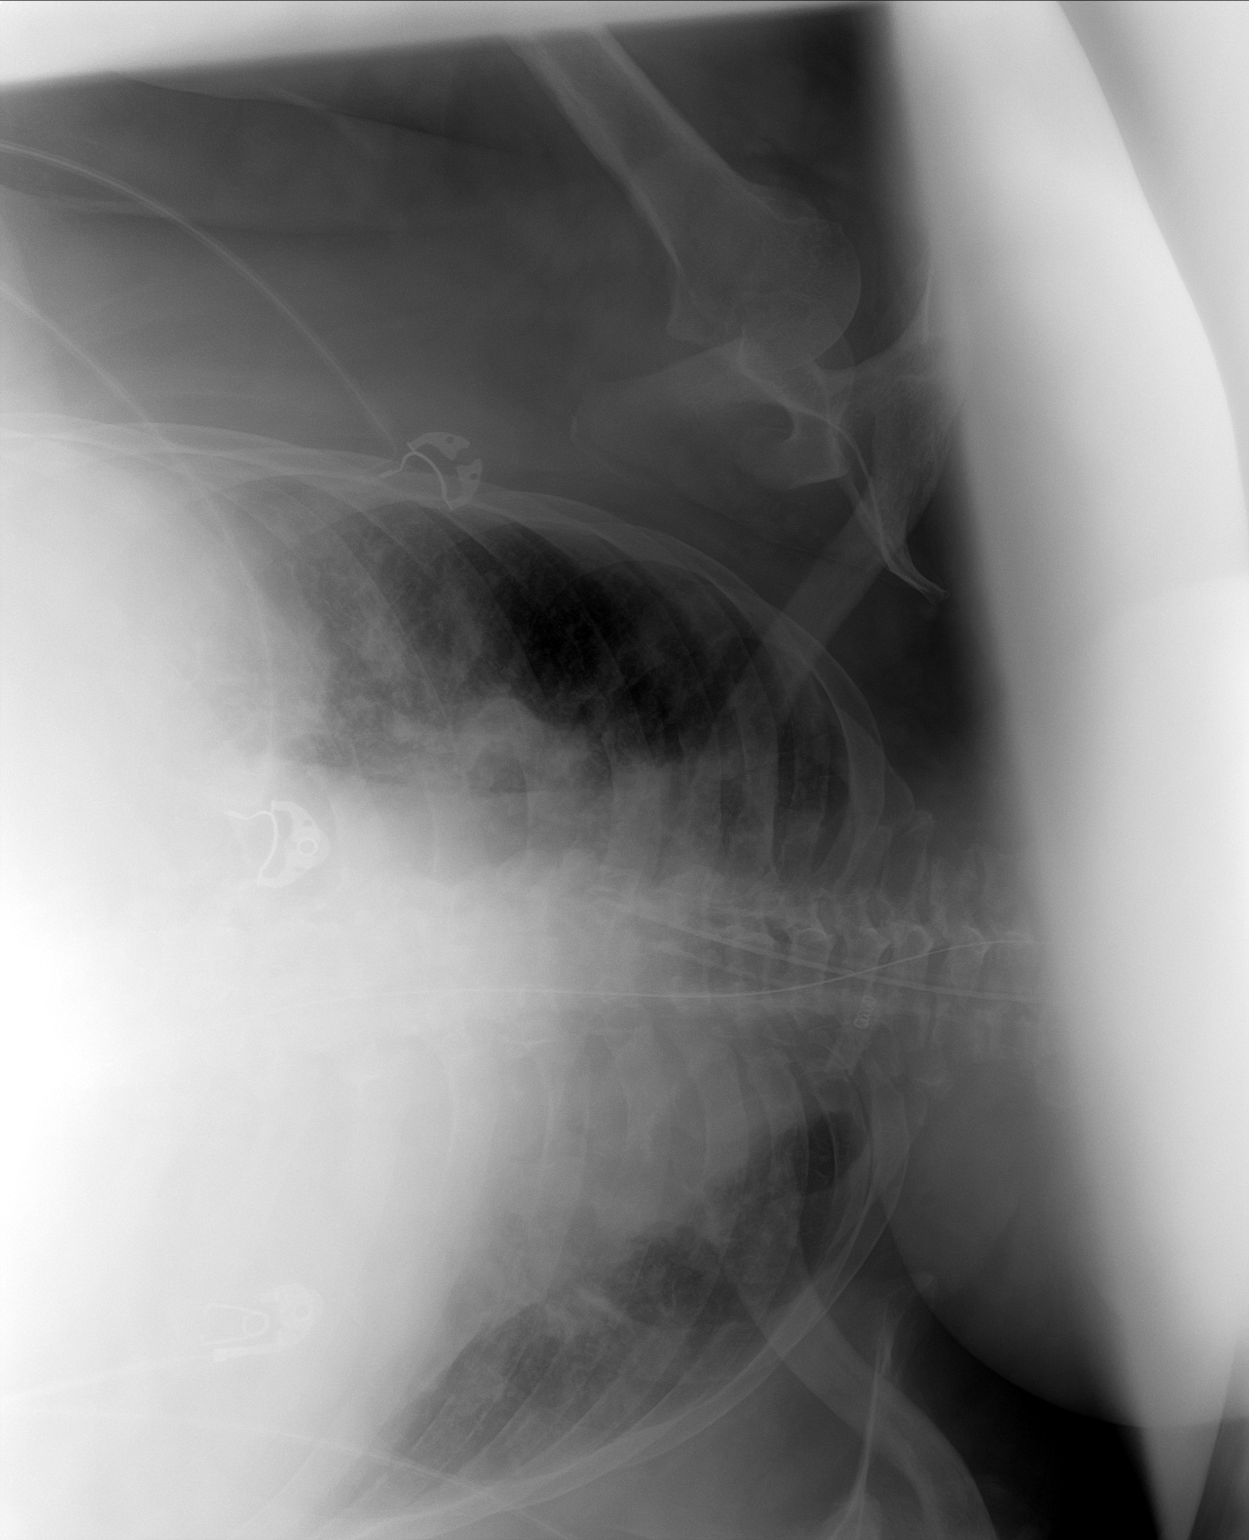

[1 of 1 positions shown; findings below may reference images not displayed]

FINDINGS: Tip of the endotracheal tube still remains deep within the right
mainstem bronchus. Retraction by approximately 3.5 cm recommended.

Remainder of the examination remains unchanged.
IMPRESSION: Tip of endotracheal tube remains deep within the right mainstem
bronchus. Retraction by approximately 3-3.5 cm recommended.

Critical Value/emergent results were called by telephone at the time
of interpretation on 10/12/2018 at [DATE] to Dr. DAVANTE BOLEN , who
verbally acknowledged these results.

## 2019-07-25 IMAGING — DX PORTABLE CHEST - 1 VIEW
1 series · 1 of 1 positions shown · non-contrast
Comparison: 10/07/2018

CLINICAL DATA: Altered mental status, shortness of breath

EXAM:
PORTABLE CHEST 1 VIEW

[chest ap]
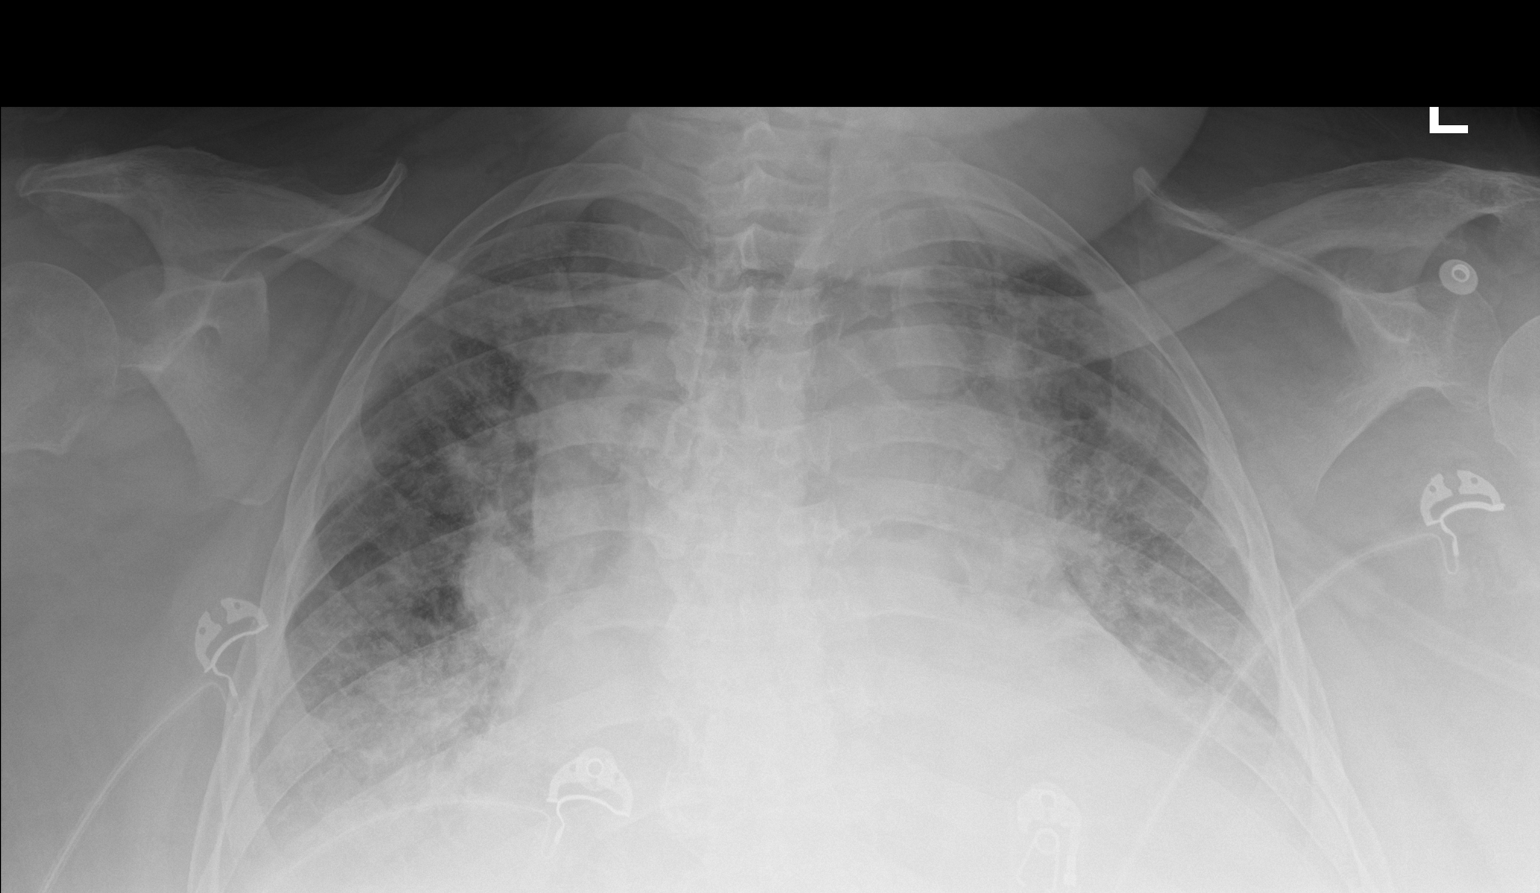

[1 of 1 positions shown; findings below may reference images not displayed]

FINDINGS: Cardiomegaly. Diffuse bilateral airspace disease, favor edema/CHF.
Suspect layering effusions. Very low lung volumes. No acute bony
abnormality.
IMPRESSION: Cardiomegaly with severe diffuse bilateral airspace disease and
suspected layering effusions, likely edema/CHF. Low lung volumes.
